# Patient Record
Sex: Female | Born: 1950
Health system: Southern US, Community
[De-identification: ages and names within clinical notes are randomized; demographics above are authoritative.]

## PROBLEM LIST (undated history)

## (undated) DIAGNOSIS — M1990 Unspecified osteoarthritis, unspecified site: Secondary | ICD-10-CM

## (undated) DIAGNOSIS — C801 Malignant (primary) neoplasm, unspecified: Secondary | ICD-10-CM

## (undated) DIAGNOSIS — Z8679 Personal history of other diseases of the circulatory system: Secondary | ICD-10-CM

## (undated) DIAGNOSIS — K644 Residual hemorrhoidal skin tags: Secondary | ICD-10-CM

## (undated) DIAGNOSIS — R112 Nausea with vomiting, unspecified: Secondary | ICD-10-CM

## (undated) DIAGNOSIS — T4145XA Adverse effect of unspecified anesthetic, initial encounter: Secondary | ICD-10-CM

## (undated) DIAGNOSIS — D649 Anemia, unspecified: Secondary | ICD-10-CM

## (undated) DIAGNOSIS — K921 Melena: Secondary | ICD-10-CM

## (undated) DIAGNOSIS — R221 Localized swelling, mass and lump, neck: Secondary | ICD-10-CM

## (undated) DIAGNOSIS — T8859XA Other complications of anesthesia, initial encounter: Secondary | ICD-10-CM

## (undated) DIAGNOSIS — Z9889 Other specified postprocedural states: Secondary | ICD-10-CM

## (undated) DIAGNOSIS — H9209 Otalgia, unspecified ear: Secondary | ICD-10-CM

## (undated) DIAGNOSIS — K648 Other hemorrhoids: Secondary | ICD-10-CM

## (undated) DIAGNOSIS — R220 Localized swelling, mass and lump, head: Secondary | ICD-10-CM

## (undated) DIAGNOSIS — N390 Urinary tract infection, site not specified: Secondary | ICD-10-CM

## (undated) DIAGNOSIS — I10 Essential (primary) hypertension: Secondary | ICD-10-CM

## (undated) HISTORY — PX: CYSTOSCOPY: SUR368

## (undated) HISTORY — DX: Otalgia, unspecified ear: H92.09

## (undated) HISTORY — PX: BREAST BIOPSY: SHX20

## (undated) HISTORY — DX: Localized swelling, mass and lump, head: R22.0

## (undated) HISTORY — PX: APPENDECTOMY: SHX54

## (undated) HISTORY — DX: Personal history of other diseases of the circulatory system: Z86.79

## (undated) HISTORY — DX: Melena: K92.1

## (undated) HISTORY — DX: Residual hemorrhoidal skin tags: K64.4

## (undated) HISTORY — DX: Essential (primary) hypertension: I10

## (undated) HISTORY — DX: Unspecified osteoarthritis, unspecified site: M19.90

## (undated) HISTORY — PX: COLONOSCOPY: SHX174

## (undated) HISTORY — PX: DIAGNOSTIC LAPAROSCOPY: SUR761

## (undated) HISTORY — PX: OTHER SURGICAL HISTORY: SHX169

## (undated) HISTORY — DX: Other hemorrhoids: K64.8

## (undated) HISTORY — DX: Anemia, unspecified: D64.9

## (undated) HISTORY — DX: Malignant (primary) neoplasm, unspecified: C80.1

## (undated) HISTORY — DX: Localized swelling, mass and lump, head: R22.1

---

## 1983-05-19 HISTORY — PX: OTHER SURGICAL HISTORY: SHX169

## 2000-10-20 ENCOUNTER — Encounter: Payer: Self-pay | Admitting: Family Medicine

## 2001-03-18 ENCOUNTER — Encounter: Payer: Self-pay | Admitting: Family Medicine

## 2001-03-18 LAB — CONVERTED CEMR LAB

## 2003-05-19 LAB — HM COLONOSCOPY

## 2005-06-18 LAB — CONVERTED CEMR LAB: Pap Smear: NORMAL

## 2005-07-31 ENCOUNTER — Ambulatory Visit: Payer: Self-pay | Admitting: Unknown Physician Specialty

## 2006-10-15 ENCOUNTER — Ambulatory Visit: Payer: Self-pay | Admitting: Family Medicine

## 2006-10-15 DIAGNOSIS — H9209 Otalgia, unspecified ear: Secondary | ICD-10-CM | POA: Insufficient documentation

## 2006-10-15 DIAGNOSIS — Z8679 Personal history of other diseases of the circulatory system: Secondary | ICD-10-CM | POA: Insufficient documentation

## 2006-10-15 DIAGNOSIS — K644 Residual hemorrhoidal skin tags: Secondary | ICD-10-CM | POA: Insufficient documentation

## 2006-10-15 DIAGNOSIS — K921 Melena: Secondary | ICD-10-CM | POA: Insufficient documentation

## 2006-10-15 DIAGNOSIS — R221 Localized swelling, mass and lump, neck: Secondary | ICD-10-CM

## 2006-10-15 DIAGNOSIS — K648 Other hemorrhoids: Secondary | ICD-10-CM | POA: Insufficient documentation

## 2006-10-15 DIAGNOSIS — R220 Localized swelling, mass and lump, head: Secondary | ICD-10-CM | POA: Insufficient documentation

## 2006-10-15 LAB — CONVERTED CEMR LAB: OCCULT 1: NEGATIVE

## 2006-10-18 DIAGNOSIS — D509 Iron deficiency anemia, unspecified: Secondary | ICD-10-CM | POA: Insufficient documentation

## 2006-10-18 LAB — CONVERTED CEMR LAB: Hemoglobin: 11.2 g/dL — ABNORMAL LOW (ref 12.0–15.0)

## 2006-11-05 ENCOUNTER — Encounter: Payer: Self-pay | Admitting: Family Medicine

## 2006-11-16 HISTORY — PX: HEMORRHOID SURGERY: SHX153

## 2006-12-10 ENCOUNTER — Ambulatory Visit (HOSPITAL_COMMUNITY): Admission: RE | Admit: 2006-12-10 | Discharge: 2006-12-10 | Payer: Self-pay | Admitting: *Deleted

## 2006-12-10 ENCOUNTER — Encounter (INDEPENDENT_AMBULATORY_CARE_PROVIDER_SITE_OTHER): Payer: Self-pay | Admitting: *Deleted

## 2006-12-24 ENCOUNTER — Encounter: Payer: Self-pay | Admitting: Family Medicine

## 2007-01-04 ENCOUNTER — Encounter: Payer: Self-pay | Admitting: Family Medicine

## 2007-01-07 ENCOUNTER — Encounter: Payer: Self-pay | Admitting: Family Medicine

## 2007-01-07 ENCOUNTER — Other Ambulatory Visit: Admission: RE | Admit: 2007-01-07 | Discharge: 2007-01-07 | Payer: Self-pay | Admitting: Family Medicine

## 2007-01-07 ENCOUNTER — Ambulatory Visit: Payer: Self-pay | Admitting: Family Medicine

## 2007-01-12 LAB — CONVERTED CEMR LAB: Pap Smear: NORMAL

## 2007-01-14 ENCOUNTER — Ambulatory Visit: Payer: Self-pay | Admitting: Family Medicine

## 2007-01-19 LAB — CONVERTED CEMR LAB
ALT: 13 units/L (ref 0–35)
AST: 15 units/L (ref 0–37)
Albumin: 3.4 g/dL — ABNORMAL LOW (ref 3.5–5.2)
Alkaline Phosphatase: 69 units/L (ref 39–117)
Bilirubin, Direct: 0.1 mg/dL (ref 0.0–0.3)
CO2: 32 meq/L (ref 19–32)
Chloride: 106 meq/L (ref 96–112)
Cholesterol: 173 mg/dL (ref 0–200)
HDL: 30.6 mg/dL — ABNORMAL LOW (ref >39.0)
Hemoglobin: 11.9 g/dL — ABNORMAL LOW (ref 12.0–15.0)
LDL Cholesterol: 125 mg/dL — ABNORMAL HIGH (ref 0–99)
Potassium: 4.3 meq/L (ref 3.5–5.1)
Sodium: 142 meq/L (ref 135–145)
Total Bilirubin: 0.5 mg/dL (ref 0.3–1.2)
Total CHOL/HDL Ratio: 5.7
Total Protein: 6.4 g/dL (ref 6.0–8.3)
Triglycerides: 86 mg/dL (ref 0–149)
VLDL: 17 mg/dL (ref 0–40)

## 2007-02-11 ENCOUNTER — Ambulatory Visit: Payer: Self-pay | Admitting: Family Medicine

## 2007-02-11 ENCOUNTER — Encounter: Payer: Self-pay | Admitting: Family Medicine

## 2008-07-02 ENCOUNTER — Ambulatory Visit: Payer: Self-pay | Admitting: Family Medicine

## 2008-07-12 ENCOUNTER — Encounter: Payer: Self-pay | Admitting: Family Medicine

## 2008-07-12 ENCOUNTER — Ambulatory Visit: Payer: Self-pay | Admitting: Family Medicine

## 2008-07-12 LAB — HM MAMMOGRAPHY: HM Mammogram: NORMAL

## 2008-07-16 ENCOUNTER — Encounter (INDEPENDENT_AMBULATORY_CARE_PROVIDER_SITE_OTHER): Payer: Self-pay | Admitting: *Deleted

## 2008-08-03 ENCOUNTER — Ambulatory Visit: Payer: Self-pay | Admitting: Family Medicine

## 2008-08-06 LAB — CONVERTED CEMR LAB
ALT: 18 units/L (ref 0–35)
AST: 17 units/L (ref 0–37)
Albumin: 3.8 g/dL (ref 3.5–5.2)
Alkaline Phosphatase: 66 units/L (ref 39–117)
BUN: 14 mg/dL (ref 6–23)
Bilirubin, Direct: 0 mg/dL (ref 0.0–0.3)
CO2: 30 meq/L (ref 19–32)
Calcium: 9 mg/dL (ref 8.4–10.5)
Chloride: 112 meq/L (ref 96–112)
Cholesterol: 199 mg/dL (ref 0–200)
Creatinine, Ser: 0.8 mg/dL (ref 0.4–1.2)
GFR calc non Af Amer: 78.58 mL/min (ref >60)
Glucose, Bld: 98 mg/dL (ref 70–99)
HDL: 42.7 mg/dL (ref >39.00)
LDL Cholesterol: 140 mg/dL — ABNORMAL HIGH (ref 0–99)
Potassium: 4.3 meq/L (ref 3.5–5.1)
Sodium: 146 meq/L — ABNORMAL HIGH (ref 135–145)
Total Bilirubin: 0.5 mg/dL (ref 0.3–1.2)
Total CHOL/HDL Ratio: 5
Total Protein: 6.6 g/dL (ref 6.0–8.3)
Triglycerides: 80 mg/dL (ref 0.0–149.0)
VLDL: 16 mg/dL (ref 0.0–40.0)

## 2008-11-06 ENCOUNTER — Encounter (INDEPENDENT_AMBULATORY_CARE_PROVIDER_SITE_OTHER): Payer: Self-pay | Admitting: *Deleted

## 2010-06-16 ENCOUNTER — Ambulatory Visit
Admission: RE | Admit: 2010-06-16 | Discharge: 2010-06-16 | Payer: Self-pay | Source: Home / Self Care | Attending: Family Medicine | Admitting: Family Medicine

## 2010-06-16 ENCOUNTER — Encounter: Payer: Self-pay | Admitting: Family Medicine

## 2010-06-16 DIAGNOSIS — N390 Urinary tract infection, site not specified: Secondary | ICD-10-CM | POA: Insufficient documentation

## 2010-06-16 LAB — CONVERTED CEMR LAB
Bilirubin Urine: NEGATIVE
Glucose, Urine, Semiquant: NEGATIVE
Ketones, urine, test strip: NEGATIVE
Nitrite: NEGATIVE
Protein, U semiquant: NEGATIVE
Specific Gravity, Urine: 1.015
Urobilinogen, UA: 0.2
WBC Urine, dipstick: NEGATIVE
pH: 5

## 2010-06-17 ENCOUNTER — Telehealth: Payer: Self-pay | Admitting: Family Medicine

## 2010-06-17 NOTE — Consult Note (Signed)
Summary: Consultation Report  Consultation Report   Imported By: Mickle Asper 12/30/2006 13:22:49  _____________________________________________________________________  External Attachment:    Type:   Image     Comment:   External Document

## 2010-06-17 NOTE — Letter (Signed)
Summary: Results Follow up Letter  [place] [facility]  [address] [place], [place] [address]   Phone: [phone]  Fax: [phone]    07/16/2008 MRN: [id number]    Steve Ilg [address] [place], [place]  [address]    Dear Ms. DESAUTEL,  The following are the results of your recent test(s):  Test         Result    Pap Smear:        Normal _____  Not Normal _____ Comments: ______________________________________________________ Cholesterol: LDL(Bad cholesterol):         Your goal is less than:         HDL (Good cholesterol):       Your goal is more than: Comments:  ______________________________________________________ Mammogram:        Normal ___X__  Not Normal _____ Comments:  Yearly follow up is recommended.   ___________________________________________________________________ Hemoccult:        Normal _____  Not normal _______ Comments:    _____________________________________________________________________ Other Tests:    We routinely do not discuss normal results over the telephone.  If you desire a copy of the results, or you have any questions about this information we can discuss them at your next office visit.   Sincerely,     Excell Seltzer, M.D.  AEB:lsf

## 2010-06-17 NOTE — Assessment & Plan Note (Signed)
Summary: PAP SMEAR AND CPX/CLE   Vital Signs:  Patient Profile:   60 Years Old Female Height:     67.5 inches (171.45 cm) Weight:      188.38 pounds Temp:     98.6 degrees F oral Pulse rate:   76 / minute Pulse rhythm:   regular BP sitting:   120 / 80  (left arm) Cuff size:   regular  Vitals Entered By: Delilah Shan (July 02, 2008 2:23 PM)                 Chief Complaint:  CPX  - ? Pap.  History of Present Illness: Doing well no acute complaints.  Due for mammogram, sister diagnosed with breast cancer in last few months.     Current Allergies (reviewed today): No known allergies   Past Medical History:    Reviewed history and no changes required:              ANEMIA, IRON DEFICIENCY NOS (ICD-280.9)       HEMORRHOIDS, INTERNAL, WITH BLEEDING (ICD-455.2)       EAR PAIN, RIGHT (ICD-388.70)       SYMPTOM, SWELLING IN HEAD/NECK (ICD-784.2)       HEMATOCHEZIA (ICD-578.1)       EXTERNAL HEMORRHOIDS (ICD-455.3)       HEART MURMUR, HX OF (ICD-V12.50)           Family History:    father died age 81 Bright's disease, heart disease, MI    mother age 109 HTN, bradycardia,  no MI    PGM: DM    MGM: CVA    sister: DM, high cholesterol, breast cancer    brother; HTN, meningioma on brain stem    sister HTN, high cholesterol  Social History:    Reviewed history from 01/04/2007 and no changes required:       Occupation: Stage manager       Married       2 kids, healthy, one is adopted, age 4, (32 week old grandchild, hasn't seen)       Former smoker:  1-1/2 years as teen       Alcohol use-yes, 1 drink per week       Drug use-no       Regular exercise-no, some walking       Diet; some fruit and veggies, avoids fast food           Review of Systems       no breast lesions  General      Denies fatigue.  CV      Denies chest pain or discomfort, fatigue, and swelling of feet.  Resp      Denies shortness of breath.  GI      Denies abdominal pain,  bloody stools, change in bowel habits, constipation, and diarrhea.  GU      Denies abnormal vaginal bleeding and dysuria.  Derm      Denies lesion(s).  Psych      Denies anxiety and depression.   Physical Exam  General:     overweight apearing female in NAD Eyes:     No corneal or conjunctival inflammation noted. EOMI. Perrla. Funduscopic exam benign, without hemorrhages, exudates or papilledema. Vision grossly normal. Ears:     External ear exam shows no significant lesions or deformities.  Otoscopic examination reveals clear canals, tympanic membranes are intact bilaterally without bulging, retraction, inflammation or discharge. Hearing is grossly normal bilaterally. Nose:  External nasal examination shows no deformity or inflammation. Nasal mucosa are pink and moist without lesions or exudates. Mouth:     Oral mucosa and oropharynx without lesions or exudates.  Teeth in good repair. Neck:     2cm soft mobile mass posterior neck... feels more like lipoma than lymphnode Breasts:     No mass, nodules, thickening, tenderness, bulging, retraction, inflamation, nipple discharge or skin changes noted.   Lungs:     Normal respiratory effort, chest expands symmetrically. Lungs are clear to auscultation, no crackles or wheezes. Heart:     Normal rate and regular rhythm. S1 and S2 normal without gallop, murmur, click, rub or other extra sounds. Abdomen:     Bowel sounds positive,abdomen soft and non-tender without masses, organomegaly or hernias noted. Pulses:     R and L posterior tibial pulses are full and equal bilaterally  Extremities:     no edema Skin:     multile moles... has upcoming skin check with dermaltologist    Impression & Recommendations:  Problem # 1:  EXAMINATION, ROUTINE MEDICAL (ICD-V70.0) Reviewed preventive care protocols, scheduled due services, and updated immunizations. Encouraged exercise, weight loss, healthy eating habits.  Due for reeval of    Problem # 2:  SYMPTOM, SWELLING IN HEAD/NECK (ICD-784.2) More consistent with lipoma. Get second opinion at upcoming derm appt.   Complete Medication List: 1)  Calcium 600/vitamin D 600-400 Mg-unit Tabs (Calcium carbonate-vitamin d) .... Take 1 tablet by mouth every 12 hours  Other Orders: Radiology Referral (Radiology)   Patient Instructions: 1)  Fasting lipid, CMET Dx v77.91 2)  Referral Appointment Information 3)  Day/Date: 4)  Time: 5)  Place/MD: 6)  Address: 7)  Phone/Fax: 8)  Patient given appointment information. Information/Orders faxed/mailed.     Prior Medications (reviewed today): None Current Allergies (reviewed today): No known allergies  Current Medications (including changes made in today's visit):  CALCIUM 600/VITAMIN D 600-400 MG-UNIT TABS (CALCIUM CARBONATE-VITAMIN D) Take 1 tablet by mouth every 12 hours   Flu Vaccine Result Date:  01/17/2008 Flu Vaccine Result:  given Flu Vaccine Next Due:  1 yr Last PAP:  normal (01/12/2007 8:11:49 AM) PAP Next Due:  3 yr           Tetanus/Td Vaccine (to be given today)   Appended Document: PAP SMEAR AND CPX/CLE   Tetanus/Td Vaccine    Vaccine Type: Tdap    Site: left deltoid    Mfr: Sanofi Pasteur    Dose: 0.5 ml    Route: IM    Given by: Delilah Shan    Exp. Date: 07/13/2010    Lot #: [id number]    VIS given: 04/05/07 version given July 02, 2008.

## 2010-06-17 NOTE — Assessment & Plan Note (Signed)
Summary: PT TO RE-ESTABLISH/CLE   Vital Signs:  Patient Profile:   60 Years Old Female Weight:      182 pounds Temp:     97.8 degrees F oral Pulse rate:   63 / minute BP sitting:   116 / 84  (left arm) Cuff size:   regular  Vitals Entered By: Cooper Render (Oct 15, 2006 3:45 PM)               Chief Complaint:  to re est.  History of Present Illness: Here  to reestablish, lastseen Dr. Milinda Antis in 2003  Due for mamm, pap, ? colon,  had DEXa in 2007  Bleeding hemmorhoids daily brbpr ,,occ feels protruding hemorrhoids, tender to palp some clots in toilet daily. No pain, itching mild discomfort after BM. Fatigue, tires easily x 1-2 months OCc intermittant dizzyness No SOB, chest pain  lump behind neck, no change in size x 1-2 months, no tender, no erthyma  R ear pain, was tender touch x 1 weeks, no cold symptoms, better today, no fever chills, no drainage, no hearing change     Current Allergies: No known allergies   Past Surgical History:    1980 fluid filled cyst on R fallopian tube, surgical removal of R tube    1980 appendectomy    In vitor fertilization 1985, 198    1999 breast biopsy, R   Family History:    father died age 45 Bright's disease, heart disease, MI    mother age 19 HTN, bradycardia,  no MI    PGM: DM    MGM: CVA    sister: DM, high cholesterol    brother; HTN, meningioma on brain stem    sister HTN, high cholesterol  Social History:    Occupation: Stage manager    Married    2 kids, healthy, one is adopted    Never Smoked    Alcohol use-yes, 1 drink per week    Drug use-no    Regular exercise-no    Diet; some fruit and veggies, avoids fast food       Risk Factors:  Tobacco use:  never Drug use:  no Alcohol use:  yes Exercise:  no  Mammogram History:     Date of Last Mammogram:  06/18/2006    Results:  Normal Bilateral   PAP Smear History:     Date of Last PAP Smear:  06/18/2005    Results:  Normal   Colonoscopy  History:     Date of Last Colonoscopy:  05/19/2003    Results:  Adenomatous Polyp    Review of Systems      See HPI   Physical Exam  General:     Well-developed,well-nourished,in no acute distress; alert,appropriate and cooperative throughout examination Head:     Normocephalic and atraumatic without obvious abnormalities. No apparent alopecia or balding. Eyes:     No corneal or conjunctival inflammation noted. EOMI. Perrla. Vision grossly normal. Ears:     External ear exam shows no significant lesions or deformities.  Otoscopic examination reveals clear canals, tympanic membranes are intact bilaterally without bulging, retraction, inflammation or discharge. Hearing is grossly normal bilaterally. Nose:     External nasal examination shows no deformity or inflammation. Nasal mucosa are pink and moist without lesions or exudates. Mouth:     Oral mucosa and oropharynx without lesions or exudates.  Teeth in good repair. Neck:     lima bean size mass on posterior R neck, non tender, no  erythema Lungs:     Normal respiratory effort, chest expands symmetrically. Lungs are clear to auscultation, no crackles or wheezes. Heart:     Normal rate and regular rhythm. S1 and S2 normal without gallop, murmur, click, rub or other extra sounds. Abdomen:     Bowel sounds positive,abdomen soft and non-tender without masses, organomegaly or hernias noted. Rectal:     internal hemorrhoid(s), no bleeding.   Pulses:     R and L carotid,radial,femoral,dorsalis pedis and posterior tibial pulses are full and equal bilaterally Extremities:     No clubbing, cyanosis, edema, or deformity noted with normal full range of motion of all joints.   Neurologic:     No cranial nerve deficits noted. Station and gait are Plantar reflexes are down-going bilaterally. DTRs are symmetrical throughout. Sensory, motor and coordinative functions appear intact. Cervical Nodes:     No lymphadenopathy noted No posterior  occipital, or supraclavicaular nodes. Axillary Nodes:     No palpable lymphadenopathy Psych:     Cognition and judgment appear intact. Alert and cooperative with normal attention span and concentration. No apparent delusions, illusions, hallucinations    Impression & Recommendations:  Problem # 1:  HEMATOCHEZIA (ICD-578.1) Non tender brbrp in setting on grade 2 internal hemmorhoids. She is having significant chronic blood loss and wold likely benefiit from hemmorhoid trx such as sclerotherapy or banding.  Will refer her to general surgeon for consult.  Also will get records of old colonoscopy, may be due for repeat eval by GI with colonoscopy.  Will schedule if needed. Check Hg to eval for anemia. Orders: Anoscopy ([id number])   Problem # 2:  SYMPTOM, SWELLING IN HEAD/NECK (ICD-784.2) likely cyst or lymph node, ? reactive from recent ear issue/inf?  Follow.  Problem # 3:  EAR PAIN, RIGHT (ICD-388.70) Assessment: Improved Ears clear B. If pain returns f/u.  Other Orders: T- * Misc. Laboratory test [phone]) Surgical Referral (Surgery)   Patient Instructions: 1)  Return for pap/breast exam. 2)  Referral Appointment Information  General surgery for consult on bleeding internal hemmorhoids and possible sclerotherapy/banding etc. 3)  Day/Date: 4)  Time: 5)  Place/MD: 6)  Address: 7)  Phone/Fax: 8)  Patient given appointment information. Information/Orders faxed/mailed.   Laboratory Results    Stool - Occult Blood Hemmoccult #1: negative Date: 10/15/2006

## 2010-06-17 NOTE — Letter (Signed)
Summary: [place] No Show Letter  [place] at [facility]  [address] [place], [place] [address]   Phone: [phone]  Fax: [phone]    11/06/2008 MRN: [id number]  [facility] [address] [place], [place]  [address]   Dear Jo White,   Our records indicate that you missed your scheduled appointment with _lab___________________ on __6/22/10__________.  Please contact this office to reschedule your appointment as soon as possible.  It is important that you keep your scheduled appointments with your physician, so we can provide you the best care possible.  Please be advised that there may be a charge for "no show" appointments.    Sincerely,    at [facility]

## 2010-06-17 NOTE — Consult Note (Signed)
Summary: Consultation Report  Consultation Report   Imported By: Beau Fanny 11/15/2006 15:44:30  _____________________________________________________________________  External Attachment:    Type:   Image     Comment:   External Document

## 2010-06-25 NOTE — Assessment & Plan Note (Signed)
Summary: UTI   Vital Signs:  Patient profile:   60 year old female Height:      67.5 inches Weight:      205.75 pounds BMI:     31.86 Temp:     97.9 degrees F oral Pulse rate:   88 / minute Pulse rhythm:   regular BP sitting:   154 / 88  (left arm) Cuff size:   large  Vitals Entered By: Delilah Shan CMA Danese Dorsainvil Dull) (June 16, 2010 2:01 PM) CC: ? UTI   History of Present Illness: Mother is in hospital and very ill.  Saturday night noted red urine.  No pain at that point, no urgency.  Some urgency and red urine on "Sunday.  Went to UC in [place] yesterday, she was told that urine had bacteria and white cells.  Started on macrobid and pyridium.  More frequency in the last 24 hours.  More pain with urination.  Has passed clots.  This AM the pain and urgency started back.  No fevers.  No vomiting.  Lower back has been mildly sore today, intermittently.    Allergies: No Known Drug Allergies  Review of Systems       See HPI.  Otherwise negative.    Physical Exam  General:  no apparent distress, but tearful discussing her mother's condition, regains composure mucous membranes moist regular rate and rhythm clear to auscultation bilaterally abdomen soft, not tender to palpation, no suprapubic pain ext w/o edema no CVA pain   Impression & Recommendations:  Problem # 1:  UTI (ICD-599.0) Likely a hemorrhagic cystitis, likely infectious.  Will change to septra and continue the pyridium for now.  Push fluids and follow up as needed.  No fever, nontoxic, and no indication of pyelo.  She'll let us know if symptoms continue, worsen, or if she gets a fever.  she understands.   Her updated medication list for this problem includes:    Septra Ds 800-160 Mg Tabs (Sulfamethoxazole-trimethoprim) ..... 1 by mouth two times a day  Orders: Specimen Handling (99000) T-Culture, Urine ([id number]) Prescription Created Electronically (G8553) UA Dipstick w/o Micro (manual) (81002)  Complete  Medication List: 1)  Septra Ds 800-160 Mg Tabs (Sulfamethoxazole-trimethoprim) .... 1 by mouth two times a day  Patient Instructions: 1)  Stop the macrobid, start the septra and drink plenty of fluids.  Let us know if you have a fever.  We'll contact you with your lab report.  Prescriptions: SEPTRA DS 800-160 MG TABS (SULFAMETHOXAZOLE-TRIMETHOPRIM) 1 by mouth two times a day  #6 x 0   Entered and Authorized by:   Shankar Silber MD   Signed by:   Jalissa Heinzelman MD on 06/16/2010   Method used:   Faxed to ...       [facility] [address] (retail)       [address]       [place], [place]    [place]       Ph: [phone]       Fax: [phone]   RxID:   [id number]    Orders Added: 1)  Specimen Handling [99000] 2)  T-Culture, Urine [[id number]] 3)  Prescription Created Electronically [G8553] 4)  Est. Patient Level III [36644] 5)  UA Dipstick w/o Micro (manual) [81002]    Current Allergies (reviewed today): No known allergies   Laboratory Results   Urine Tests  Date/Time Received: June 16, 2010 2:37 PM   Routine Urinalysis   Color: brown Appearance: Cloudy Glucose: negative   (Normal  Range: Negative) Bilirubin: negative   (Normal Range: Negative) Ketone: negative   (Normal Range: Negative) Spec. Gravity: 1.015   (Normal Range: 1.003-1.035) Blood: large   (Normal Range: Negative) pH: 5.0   (Normal Range: 5.0-8.0) Protein: negative   (Normal Range: Negative) Urobilinogen: 0.2   (Normal Range: 0-1) Nitrite: negative   (Normal Range: Negative) Leukocyte Esterace: negative   (Normal Range: Negative)    Comments: Severely obscured by Pyridium.

## 2010-07-03 NOTE — Procedures (Signed)
Summary: colonoscopy/endoscopy  colonoscopy/endoscopy   Imported By: Kassie Mends 06/25/2010 11:24:52  _____________________________________________________________________  External Attachment:    Type:   Image     Comment:   External Document

## 2010-07-09 NOTE — Progress Notes (Signed)
Summary: ? Colonoscopy  Phone Note Call from Patient   Details for Reason: [facility]ain Summary of Call: Notify pt that records show last colonoscopy at [facility] was 2002... hyperplastic polyps seen... they reocmmended her having it repeated in 5 years.Marland Kitchen ie 2007... did she ever have the repeat done? Initial call taken by: Kerby Nora MD,  June 17, 2010 2:08 PM  Follow-up for Phone Call        Spoke to patient and was informed that she did not have the repeat colonoscopy that was recommended. Patient said that she has an appointment scheduled with you in April and will talk with you about setting it up at that time. Patient stated that she has too much going on now to schedule it at this time. Sydell Axon LPN  June 30, 2010 4:49 PM

## 2010-07-14 ENCOUNTER — Encounter: Payer: Self-pay | Admitting: Family Medicine

## 2010-07-19 ENCOUNTER — Encounter: Payer: Self-pay | Admitting: Family Medicine

## 2010-08-05 ENCOUNTER — Other Ambulatory Visit: Payer: Self-pay

## 2010-08-11 ENCOUNTER — Encounter: Payer: Self-pay | Admitting: Family Medicine

## 2010-08-27 ENCOUNTER — Telehealth: Payer: Self-pay | Admitting: Family Medicine

## 2010-08-27 DIAGNOSIS — D509 Iron deficiency anemia, unspecified: Secondary | ICD-10-CM

## 2010-08-27 DIAGNOSIS — Z13220 Encounter for screening for lipoid disorders: Secondary | ICD-10-CM

## 2010-08-27 NOTE — Telephone Encounter (Signed)
Message copied by Kerby Nora on Wed Aug 27, 2010  6:44 PM ------      Message from: Margarite Gouge, NATASHA      Created: Wed Aug 27, 2010  7:24 AM      Regarding: Cpx alabs thurs       Please order  future cpx labs for pt's upcomming lab appt.      Thanks      Rodney Booze

## 2010-08-28 ENCOUNTER — Other Ambulatory Visit (INDEPENDENT_AMBULATORY_CARE_PROVIDER_SITE_OTHER): Payer: BLUE CROSS/BLUE SHIELD | Admitting: Family Medicine

## 2010-08-28 DIAGNOSIS — Z13220 Encounter for screening for lipoid disorders: Secondary | ICD-10-CM

## 2010-08-28 DIAGNOSIS — E785 Hyperlipidemia, unspecified: Secondary | ICD-10-CM

## 2010-08-28 DIAGNOSIS — D509 Iron deficiency anemia, unspecified: Secondary | ICD-10-CM

## 2010-08-28 LAB — CBC WITH DIFFERENTIAL/PLATELET
Basophils Absolute: 0 10*3/uL (ref 0.0–0.1)
Basophils Relative: 0.4 % (ref 0.0–3.0)
Eosinophils Absolute: 0.1 10*3/uL (ref 0.0–0.7)
Eosinophils Relative: 3.2 % (ref 0.0–5.0)
HCT: 37.6 % (ref 36.0–46.0)
Hemoglobin: 13.1 g/dL (ref 12.0–15.0)
Lymphocytes Relative: 34.7 % (ref 12.0–46.0)
Lymphs Abs: 1.5 10*3/uL (ref 0.7–4.0)
MCHC: 34.8 g/dL (ref 30.0–36.0)
MCV: 86.6 fl (ref 78.0–100.0)
Monocytes Absolute: 0.4 10*3/uL (ref 0.1–1.0)
Monocytes Relative: 9.7 % (ref 3.0–12.0)
Neutro Abs: 2.2 10*3/uL (ref 1.4–7.7)
Neutrophils Relative %: 52 % (ref 43.0–77.0)
Platelets: 185 10*3/uL (ref 150.0–400.0)
RBC: 4.34 Mil/uL (ref 3.87–5.11)
RDW: 14.2 % (ref 11.5–14.6)
WBC: 4.3 10*3/uL — ABNORMAL LOW (ref 4.5–10.5)

## 2010-08-28 LAB — COMPREHENSIVE METABOLIC PANEL
ALT: 17 U/L (ref 0–35)
AST: 17 U/L (ref 0–37)
Albumin: 3.9 g/dL (ref 3.5–5.2)
Alkaline Phosphatase: 66 U/L (ref 39–117)
BUN: 13 mg/dL (ref 6–23)
CO2: 28 mEq/L (ref 19–32)
Calcium: 8.9 mg/dL (ref 8.4–10.5)
Chloride: 106 mEq/L (ref 96–112)
Creatinine, Ser: 0.8 mg/dL (ref 0.4–1.2)
GFR: 74.77 mL/min (ref >60.00)
Glucose, Bld: 101 mg/dL — ABNORMAL HIGH (ref 70–99)
Potassium: 4.6 mEq/L (ref 3.5–5.1)
Sodium: 140 mEq/L (ref 135–145)
Total Bilirubin: 0.7 mg/dL (ref 0.3–1.2)
Total Protein: 6.7 g/dL (ref 6.0–8.3)

## 2010-08-28 LAB — LIPID PANEL
Cholesterol: 225 mg/dL — ABNORMAL HIGH (ref 0–200)
HDL: 42.5 mg/dL (ref >39.00)
Total CHOL/HDL Ratio: 5
Triglycerides: 182 mg/dL — ABNORMAL HIGH (ref 0.0–149.0)
VLDL: 36.4 mg/dL (ref 0.0–40.0)

## 2010-08-28 LAB — LDL CHOLESTEROL, DIRECT: Direct LDL: 150.1 mg/dL

## 2010-08-29 ENCOUNTER — Encounter: Payer: Self-pay | Admitting: Family Medicine

## 2010-09-02 ENCOUNTER — Other Ambulatory Visit (HOSPITAL_COMMUNITY)
Admission: RE | Admit: 2010-09-02 | Discharge: 2010-09-02 | Disposition: A | Discharge status: Home / Self Care | Payer: BLUE CROSS/BLUE SHIELD | Source: Ambulatory Visit | Attending: Family Medicine | Admitting: Family Medicine

## 2010-09-02 ENCOUNTER — Ambulatory Visit (INDEPENDENT_AMBULATORY_CARE_PROVIDER_SITE_OTHER): Payer: BLUE CROSS/BLUE SHIELD | Admitting: Family Medicine

## 2010-09-02 ENCOUNTER — Encounter: Payer: Self-pay | Admitting: Family Medicine

## 2010-09-02 VITALS — BP 120/80 | HR 69 | Temp 98.4°F | Ht 67.5 in | Wt 202.1 lb

## 2010-09-02 DIAGNOSIS — M899 Disorder of bone, unspecified: Secondary | ICD-10-CM

## 2010-09-02 DIAGNOSIS — Z1231 Encounter for screening mammogram for malignant neoplasm of breast: Secondary | ICD-10-CM

## 2010-09-02 DIAGNOSIS — E7800 Pure hypercholesterolemia, unspecified: Secondary | ICD-10-CM

## 2010-09-02 DIAGNOSIS — Z01419 Encounter for gynecological examination (general) (routine) without abnormal findings: Secondary | ICD-10-CM | POA: Insufficient documentation

## 2010-09-02 DIAGNOSIS — Z1211 Encounter for screening for malignant neoplasm of colon: Secondary | ICD-10-CM

## 2010-09-02 DIAGNOSIS — M8580 Other specified disorders of bone density and structure, unspecified site: Secondary | ICD-10-CM

## 2010-09-02 DIAGNOSIS — Z0000 Encounter for general adult medical examination without abnormal findings: Secondary | ICD-10-CM

## 2010-09-02 NOTE — Progress Notes (Signed)
Subjective:    Patient ID: Jo White, female    DOB: January 15, 1951, 60 y.o.   MRN: [id number]  HPI  The patient is here for annual wellness exam and preventative care.    Has gained about 30 lbs since 2010 when she was seen last. Minimal exercsie... Working more hours. Trying to eat healthy foods in last month.  Post menopausal for years.     Review of Systems  Constitutional: Negative for fever, fatigue and unexpected weight change.  HENT: Negative for ear pain, congestion, sore throat, sneezing, trouble swallowing and sinus pressure.   Eyes: Negative for pain and itching.  Respiratory: Negative for cough, shortness of breath and wheezing.   Cardiovascular: Negative for chest pain, palpitations and leg swelling.  Gastrointestinal: Negative for nausea, abdominal pain, diarrhea, constipation and blood in stool.  Genitourinary: Negative for dysuria, hematuria, vaginal discharge, difficulty urinating and menstrual problem.  Skin: Negative for rash.  Neurological: Negative for syncope, weakness, light-headedness, numbness and headaches.  Psychiatric/Behavioral: Negative for confusion and dysphoric mood. The patient is not nervous/anxious.        Objective:   Physical Exam  Constitutional: Vital signs are normal. She appears well-developed and well-nourished. She is cooperative.  Non-toxic appearance. She does not appear ill. No distress.  HENT:  Head: Normocephalic.  Right Ear: Hearing, tympanic membrane, external ear and ear canal normal.  Left Ear: Hearing, tympanic membrane, external ear and ear canal normal.  Nose: Nose normal.  Eyes: Conjunctivae, EOM and lids are normal. Pupils are equal, round, and reactive to light. No foreign bodies found.  Neck: Trachea normal and normal range of motion. Neck supple. Carotid bruit is not present. No mass and no thyromegaly present.  Cardiovascular: Normal rate, regular rhythm, S1 normal, S2 normal, normal heart sounds and intact distal  pulses.  Exam reveals no gallop.   No murmur heard. Pulmonary/Chest: Effort normal and breath sounds normal. No respiratory distress. She has no wheezes. She has no rhonchi. She has no rales.  Abdominal: Soft. Normal appearance and bowel sounds are normal. She exhibits no distension, no fluid wave, no abdominal bruit and no mass. There is no hepatosplenomegaly. There is no tenderness. There is no rebound, no guarding and no CVA tenderness. No hernia.  Genitourinary: Vagina normal and uterus normal. No breast swelling, tenderness, discharge or bleeding. Pelvic exam was performed with patient prone. There is no rash, tenderness or lesion on the right labia. There is no rash, tenderness or lesion on the left labia. Uterus is not enlarged and not tender. Cervix exhibits no motion tenderness, no discharge and no friability. Right adnexum displays no mass, no tenderness and no fullness. Left adnexum displays no mass, no tenderness and no fullness.  Lymphadenopathy:    She has no cervical adenopathy.    She has no axillary adenopathy.  Neurological: She is alert. She has normal strength. No cranial nerve deficit or sensory deficit.  Skin: Skin is warm, dry and intact. No rash noted.  Psychiatric: Her speech is normal and behavior is normal. Judgment normal. Her mood appears not anxious. Cognition and memory are normal. She does not exhibit a depressed mood.          Assessment & Plan:  Complete Physical Exam The patient's preventative maintenance and recommended screening tests for an annual wellness exam were reviewed in full today. Brought up to date unless services declined.  Counselled on the importance of diet, exercise, and its role in overall health and mortality. The patient's  FH and SH was reviewed, including their home life, tobacco status, and drug and alcohol status.

## 2010-09-02 NOTE — Patient Instructions (Addendum)
Fish oil.. Omega three fatty acids... 2000 mg divided daily. Work on low fat , low cholesterol diet. Decrease carbohydrates in diet. Exercise 3-5 times a week. Stop at front desk to schedule bone density, GI referral and mammogram.. They may need to call you to schedule.

## 2010-09-04 DIAGNOSIS — E785 Hyperlipidemia, unspecified: Secondary | ICD-10-CM | POA: Insufficient documentation

## 2010-09-04 DIAGNOSIS — M8580 Other specified disorders of bone density and structure, unspecified site: Secondary | ICD-10-CM | POA: Insufficient documentation

## 2010-09-04 NOTE — Assessment & Plan Note (Signed)
Discussed new elevation of LDL and triglycerides along with weight gain. Encouraged exercise, weight loss, healthy eating habits.  Info given on low chol diet.  Recheck in 3 months.

## 2010-09-11 LAB — HM PAP SMEAR: HM Pap smear: NORMAL

## 2010-09-24 ENCOUNTER — Ambulatory Visit: Payer: Self-pay | Admitting: Family Medicine

## 2010-09-24 LAB — HM DEXA SCAN

## 2010-09-30 NOTE — Op Note (Signed)
NAME:  Jo White, Jo White                  ACCOUNT NO.:  [id number]   MEDICAL RECORD NO.:  [id number]          PATIENT TYPE:  AMB   LOCATION:  DAY                          FACILITY:  [facility]   PHYSICIAN:  Alfonse Ras, MD   DATE OF BIRTH:  03-03-1951   DATE OF PROCEDURE:  12/10/2006  DATE OF DISCHARGE:                               OPERATIVE REPORT   PREOPERATIVE DIAGNOSIS:  Grade II internal hemorrhoids.   POSTOPERATIVE DIAGNOSIS:  Grade II internal hemorrhoids.   PROCEDURE:  Procedure for prolapsing hemorrhoids, stapled rectopexy.   SURGEON:  Alfonse Ras, MD.   ANESTHESIA:  General   DESCRIPTION OF PROCEDURE:  The patient was taken to the operating room,  placed in supine position.  After adequate general anesthesia was  induced using endotracheal tube, the patient was placed in the prone  jack-knife position.  Perianal and rectal prep were undertaken.  The  hemorrhoidal bundles were injected with 0.5 Marcaine and internal and  external sphincter muscles were injected with 0.5 Marcaine.  Rectal  dilatation was accomplished.  A 2-0 Prolene pursestring suture was  placed in the mucosa and submucosa approximately 5 cm proximal to the  dentate line.  A PPH stapler was then inserted and pursestring suture  was tied down around it.  The stapler was closed.  The vagina was  checked.  There was no evidence of action with the vagina.  It was held  in place after being closed for approximately 45 seconds.  It was fired  and removed.  There was about a 2 cm segment of hemorrhoidal tissue  which was removed and sent for pathologic evaluation.  Staple line was  inspected.  There was one small bleeding area which was oversewn with 3-  0 chromic suture and inspected it a second time after about 5 minutes.  There was no further bleeding.  Gelfoam packing was placed.  Sterile  dressing was applied.  The patient tolerated the procedure well and went  to PACU in good condition.      Alfonse Ras, MD  Electronically Signed     KRE/MEDQ  D:  12/10/2006  T:  12/10/2006  Job:  [id number]

## 2010-10-08 ENCOUNTER — Ambulatory Visit: Payer: Self-pay | Admitting: Family Medicine

## 2010-10-17 ENCOUNTER — Encounter: Payer: Self-pay | Admitting: Family Medicine

## 2010-10-21 ENCOUNTER — Encounter: Payer: Self-pay | Admitting: Internal Medicine

## 2010-10-23 ENCOUNTER — Ambulatory Visit (AMBULATORY_SURGERY_CENTER): Payer: BC Managed Care – PPO | Admitting: *Deleted

## 2010-10-23 VITALS — Ht 68.0 in | Wt 201.0 lb

## 2010-10-23 DIAGNOSIS — Z860100 Personal history of colon polyps, unspecified: Secondary | ICD-10-CM

## 2010-10-23 MED ORDER — PEG-KCL-NACL-NASULF-NA ASC-C 100 G PO SOLR: ORAL | Status: DC

## 2010-11-05 ENCOUNTER — Encounter: Payer: Self-pay | Admitting: Family Medicine

## 2010-11-05 ENCOUNTER — Encounter: Payer: Self-pay | Admitting: *Deleted

## 2010-11-07 ENCOUNTER — Ambulatory Visit (AMBULATORY_SURGERY_CENTER): Payer: BC Managed Care – PPO | Admitting: Gastroenterology

## 2010-11-07 ENCOUNTER — Other Ambulatory Visit: Payer: BLUE CROSS/BLUE SHIELD | Admitting: Gastroenterology

## 2010-11-07 ENCOUNTER — Encounter: Payer: Self-pay | Admitting: Gastroenterology

## 2010-11-07 VITALS — BP 136/66 | HR 68 | Resp 18 | Ht 68.0 in | Wt 200.0 lb

## 2010-11-07 DIAGNOSIS — K635 Polyp of colon: Secondary | ICD-10-CM

## 2010-11-07 DIAGNOSIS — Z1211 Encounter for screening for malignant neoplasm of colon: Secondary | ICD-10-CM

## 2010-11-07 DIAGNOSIS — D126 Benign neoplasm of colon, unspecified: Secondary | ICD-10-CM

## 2010-11-07 DIAGNOSIS — Z86010 Personal history of colonic polyps: Secondary | ICD-10-CM

## 2010-11-07 MED ORDER — SODIUM CHLORIDE 0.9 % IV SOLN: 500.0000 mL | INTRAVENOUS | Status: DC

## 2010-11-07 NOTE — Patient Instructions (Signed)
Discharge instructions given with verbal understanding. Handout on polyps given. Resume previous medications. 

## 2010-11-10 ENCOUNTER — Telehealth: Payer: Self-pay

## 2010-11-10 NOTE — Telephone Encounter (Signed)
No id on answering machine  

## 2010-11-26 ENCOUNTER — Other Ambulatory Visit: Payer: BLUE CROSS/BLUE SHIELD

## 2011-01-11 LAB — HM COLONOSCOPY

## 2011-03-02 LAB — DIFFERENTIAL
Basophils Absolute: 0
Basophils Relative: 0
Eosinophils Absolute: 0.1
Eosinophils Relative: 3
Lymphocytes Relative: 27
Lymphs Abs: 0.9
Monocytes Absolute: 0.4
Monocytes Relative: 12 — ABNORMAL HIGH
Neutro Abs: 1.9
Neutrophils Relative %: 58

## 2011-03-02 LAB — CBC
HCT: 34.4 — ABNORMAL LOW
Hemoglobin: 12.1
MCHC: 35.2
MCV: 82.4
Platelets: 207
RBC: 4.17
RDW: 14
WBC: 3.3 — ABNORMAL LOW

## 2011-03-02 LAB — BASIC METABOLIC PANEL
BUN: 9
CO2: 27
Calcium: 8.6
Chloride: 104
Creatinine, Ser: 0.92
GFR calc Af Amer: >60
GFR calc non Af Amer: >60
Glucose, Bld: 99
Potassium: 3 — ABNORMAL LOW
Sodium: 140

## 2011-04-17 ENCOUNTER — Other Ambulatory Visit (INDEPENDENT_AMBULATORY_CARE_PROVIDER_SITE_OTHER): Payer: BC Managed Care – PPO

## 2011-04-17 ENCOUNTER — Telehealth: Payer: Self-pay | Admitting: *Deleted

## 2011-04-17 DIAGNOSIS — E7800 Pure hypercholesterolemia, unspecified: Secondary | ICD-10-CM

## 2011-04-17 LAB — LIPID PANEL
Cholesterol: 217 mg/dL — ABNORMAL HIGH (ref 0–200)
HDL: 41.1 mg/dL (ref >39.00)
Total CHOL/HDL Ratio: 5
Triglycerides: 177 mg/dL — ABNORMAL HIGH (ref 0.0–149.0)
VLDL: 35.4 mg/dL (ref 0.0–40.0)

## 2011-04-17 LAB — LDL CHOLESTEROL, DIRECT: Direct LDL: 150.5 mg/dL

## 2011-04-17 NOTE — Telephone Encounter (Signed)
Pt was in for f/u labs this am. She is also c/o pain in center of back that started 2 days ago it is worse in the am after waking up but gets better as day progresses. Pain increases when in certain positions and sometimes radiates to R shoulder/ arm. Denies any other pain, nausea/vomiting.  She does get sob, but states it is only with extended exertion. She has been taking alleve and it seems to help. She did not appear in any distress and refused appointment when offered, but would like your advise on how to treat muscle strain/pain.

## 2011-04-17 NOTE — Telephone Encounter (Signed)
Patient advised.

## 2011-04-17 NOTE — Telephone Encounter (Signed)
Gentle stretching, heat and antiinflammatories like aleve.  If not improving make appt to be seen.

## 2011-05-08 ENCOUNTER — Encounter: Payer: Self-pay | Admitting: Family Medicine

## 2011-05-08 ENCOUNTER — Ambulatory Visit (INDEPENDENT_AMBULATORY_CARE_PROVIDER_SITE_OTHER): Payer: BC Managed Care – PPO | Admitting: Family Medicine

## 2011-05-08 VITALS — BP 130/82 | HR 66 | Temp 98.7°F | Ht 68.0 in | Wt 211.8 lb

## 2011-05-08 DIAGNOSIS — R0989 Other specified symptoms and signs involving the circulatory and respiratory systems: Secondary | ICD-10-CM

## 2011-05-08 DIAGNOSIS — E7800 Pure hypercholesterolemia, unspecified: Secondary | ICD-10-CM

## 2011-05-08 DIAGNOSIS — R0789 Other chest pain: Secondary | ICD-10-CM

## 2011-05-08 DIAGNOSIS — R0609 Other forms of dyspnea: Secondary | ICD-10-CM

## 2011-05-08 DIAGNOSIS — K219 Gastro-esophageal reflux disease without esophagitis: Secondary | ICD-10-CM

## 2011-05-08 DIAGNOSIS — R0600 Dyspnea, unspecified: Secondary | ICD-10-CM | POA: Insufficient documentation

## 2011-05-08 DIAGNOSIS — Z8249 Family history of ischemic heart disease and other diseases of the circulatory system: Secondary | ICD-10-CM

## 2011-05-08 MED ORDER — OMEPRAZOLE 20 MG PO CPDR: 20.0000 mg | DELAYED_RELEASE_CAPSULE | Freq: Every day | ORAL | Status: DC

## 2011-05-08 NOTE — Progress Notes (Signed)
  Subjective:    Patient ID: Jo White, female    DOB: 1950/11/15, 60 y.o.   MRN: [id number]  HPI Elevated Cholesterol: No change in cholesterol since 08/2010.  LDL not at goal <130. Trig remain high although slightly lower Not on any med currently. Diet compliance: Has made some minor changes in diet, no fried food. HAs not lost any weight. Exercise: None.. Working a lot. Other complaints: See HPI for multiple new complaints    Review of Systems  Constitutional: Negative for fever and fatigue.  HENT: Negative for ear pain.   Eyes: Negative for pain.  Respiratory: Negative for chest tightness and shortness of breath.        Winded when walking up stairs  Cardiovascular: Negative for chest pain, palpitations and leg swelling.       No exertional chest pain Father died age 20 from MI but had nephritits, mom with CHF, passed away age 72  Gastrointestinal: Negative for abdominal pain.       Occ burning indigestion in chest and throat.. Using zantac, goes away. Occuring 2-3 times a week.     Genitourinary: Negative for dysuria.  Musculoskeletal:       Foot cramps   occ pain in mid back  on right.. Likely MSK.       Objective:   Physical Exam  Constitutional:       Overweight in NAD  HENT:  Head: Normocephalic.  Eyes: Conjunctivae are normal. Pupils are equal, round, and reactive to light.  Neck: Normal range of motion. Neck supple. Muscular tenderness present. No spinous process tenderness present. No thyromegaly present.       Lipoma right neck, mildly tender  Cardiovascular: Normal rate, regular rhythm, normal heart sounds and intact distal pulses.  Exam reveals no gallop and no friction rub.   No murmur heard. Pulmonary/Chest: Effort normal and breath sounds normal. No respiratory distress. She has no wheezes. She has no rales. She exhibits no tenderness.  Abdominal: Soft. Bowel sounds are normal. She exhibits no distension and no mass. There is no tenderness. There is  no rebound and no guarding.  Musculoskeletal:       Cervical back: Normal.       Thoracic back: She exhibits tenderness. She exhibits no bony tenderness.       Lateral paraspinous muscle tenderness to right.          Assessment & Plan:

## 2011-05-08 NOTE — Assessment & Plan Note (Signed)
Likely due to deconditioning. Exam nml. No sign of infectious cause.

## 2011-05-08 NOTE — Patient Instructions (Addendum)
Follow up for CPX with labs prior in 09/2010.  Avoid triggers for reflux...decrease caffeine or stop, avoid citris, tomato, spicy. Don't late a night, try to eat small meals frequently.  Prilosec 20 mg daily for 4-6 weeks while making lifestyle changes, then wean off.  Consider [employer] low carb, low fat diet.  Stop at front desk to schedule exercise stress test. If normal begin exercise routine.   Heat and gentle stretching for upper back pain.

## 2011-05-08 NOTE — Assessment & Plan Note (Signed)
Poor control but she has made minimal changes to lifestyle. She is hesitant about exercise due to other issues and family history.

## 2011-05-08 NOTE — Assessment & Plan Note (Signed)
Prilosec x 4-6 week while lifestyle changes are made.

## 2011-05-08 NOTE — Assessment & Plan Note (Addendum)
Most likely due to GERD, but given risk factors including age, fam hx, and high chol... Will eval with EKG. EKG showed: NSR, no ST changes, no LVH, no Q. Will move forward with exercise stress test to eval further given risk and pt wish to start exercise regimen.

## 2011-06-01 ENCOUNTER — Encounter: Payer: Self-pay | Admitting: Cardiovascular Disease

## 2011-06-01 ENCOUNTER — Ambulatory Visit (INDEPENDENT_AMBULATORY_CARE_PROVIDER_SITE_OTHER): Payer: BC Managed Care – PPO | Admitting: Cardiovascular Disease

## 2011-06-01 DIAGNOSIS — E7800 Pure hypercholesterolemia, unspecified: Secondary | ICD-10-CM

## 2011-06-01 DIAGNOSIS — R0789 Other chest pain: Secondary | ICD-10-CM

## 2011-06-01 NOTE — Progress Notes (Signed)
Exercise Treadmill Test  Treadmill ordered for recent epsiodes of indigestion.  Resting EKG shows NSR with rate of 69 bpm, no Significant ST or T wave changes Resting blood pressure of 120/84. Stand bruce protocal was used.  Patient exercised for 7 min 18 sec,  Peak heart rate of 168 bpm.  This was 104% of the maximum predicted heart rate (target heart rate 160). Achieved 10.1 METS No symptoms of chest pain or lightheadedness were reported at peak stress or in recovery.  Peak Blood pressure recorded was 180/90. Heart rate at 3 minutes in recovery was 98 bpm. No significant ST changes concerning for ischemia  FINAL IMPRESSION: Normal exercise stress test. No significant EKG changes concerning for ischemia. Good exercise tolerance. Heart rate did climb to the low 100s with minimal exertion.  We have encouraged a regular exercise program.

## 2011-06-01 NOTE — Patient Instructions (Signed)
The stress test showed no significant ischemia. No further workup is needed at this time. Please start a regular exercise program for conditioning and weight loss.

## 2011-10-29 ENCOUNTER — Encounter: Payer: Self-pay | Admitting: Family Medicine

## 2011-10-29 ENCOUNTER — Ambulatory Visit (INDEPENDENT_AMBULATORY_CARE_PROVIDER_SITE_OTHER): Payer: BC Managed Care – PPO | Admitting: Family Medicine

## 2011-10-29 VITALS — BP 120/72 | HR 64 | Temp 97.7°F | Ht 67.0 in | Wt 212.5 lb

## 2011-10-29 DIAGNOSIS — R300 Dysuria: Secondary | ICD-10-CM

## 2011-10-29 DIAGNOSIS — N390 Urinary tract infection, site not specified: Secondary | ICD-10-CM

## 2011-10-29 LAB — POCT URINALYSIS DIPSTICK
Bilirubin, UA: NEGATIVE
Glucose, UA: NEGATIVE
Ketones, UA: NEGATIVE
Nitrite, UA: NEGATIVE
Protein, UA: 100
Spec Grav, UA: 1.01
Urobilinogen, UA: NEGATIVE
pH, UA: 7.5

## 2011-10-29 MED ORDER — CIPROFLOXACIN HCL 250 MG PO TABS: 250.0000 mg | ORAL_TABLET | Freq: Two times a day (BID) | ORAL | Status: AC

## 2011-10-29 NOTE — Progress Notes (Signed)
   Nature conservation officer at [facility] [address] [place] [address] Phone: [phone] Fax: [phone]   Patient Name: Jo White Date of Birth: 09-Feb-1951 Age: 61 y.o. Medical Record Number: [id number] Gender: female Date of Encounter: 10/29/2011  History of Present Illness:  Jo White is a 61 y.o. very pleasant female patient who presents with the following:  Leaving Monday morning for [place] Felt some urgency and some blood through sat and sun Urgency, dysuria Now with some back / flank pain Gross hematuria  Felt ok thorugh Monday. Then this morning.   [phone] [facility] [phone]   Past Medical History, Surgical History, Social History, Family History, Problem List, Medications, and Allergies have been reviewed and updated if relevant.  Prior to Admission medications   Medication Sig Start Date End Date Taking? Authorizing Provider  omeprazole (PRILOSEC) 20 MG capsule Take 1 capsule (20 mg total) by mouth daily. 05/08/11 05/07/12 Yes Amy Michelle Nasuti, MD    Review of Systems: ROS: GEN: Acute illness details above GI: Tolerating PO intake GU: maintaining adequate hydration and urination Pulm: No SOB Interactive and getting along well at home.  Otherwise, ROS is as per the HPI.   Physical Examination: Filed Vitals:   10/29/11 1340  BP: 120/72  Pulse: 64  Temp: 97.7 F (36.5 C)   Filed Vitals:   10/29/11 1340  Height: 5\' 7"  (1.702 m)  Weight: 212 lb 8 oz (96.389 kg)   Body mass index is 33.28 kg/(m^2). Ideal Body Weight: Weight in (lb) to have BMI = 25: 159.3    GEN: WDWN, A&Ox4,NAD. Non-toxic HEENT: Atraumatc, normocephalic. CV: RRR, No M/G/R PULM: CTA B, No wheezes, crackles, or rhonchi ABD: S, NT, ND, +BS, no rebound. No CVAT. + suprapubic tenderness. EXT: No c/c/e   Assessment and Plan:  1. UTI (lower urinary tract infection)  Urine Culture  2. Dysuria  POCT Urinalysis Dipstick   Worry this is progressing to early pyelo  with back pain, check cx and 7 d  Orders Today: Orders Placed This Encounter  Procedures  . Urine Culture  . POCT Urinalysis Dipstick    Medications Today: Meds ordered this encounter  Medications  . ciprofloxacin (CIPRO) 250 MG tablet    Sig: Take 1 tablet (250 mg total) by mouth 2 (two) times daily.    Dispense:  14 tablet    Refill:  0     Hannah Beat, MD

## 2011-10-31 LAB — URINE CULTURE: Colony Count: >=100000

## 2011-12-23 ENCOUNTER — Ambulatory Visit: Payer: BC Managed Care – PPO | Admitting: Internal Medicine

## 2012-01-11 ENCOUNTER — Encounter: Payer: Self-pay | Admitting: Internal Medicine

## 2012-01-11 ENCOUNTER — Ambulatory Visit (INDEPENDENT_AMBULATORY_CARE_PROVIDER_SITE_OTHER): Payer: BC Managed Care – PPO | Admitting: Internal Medicine

## 2012-01-11 VITALS — BP 132/74 | HR 74 | Temp 98.2°F | Resp 16 | Ht 66.0 in | Wt 213.2 lb

## 2012-01-11 DIAGNOSIS — D1739 Benign lipomatous neoplasm of skin and subcutaneous tissue of other sites: Secondary | ICD-10-CM

## 2012-01-11 DIAGNOSIS — R5381 Other malaise: Secondary | ICD-10-CM

## 2012-01-11 DIAGNOSIS — R5383 Other fatigue: Secondary | ICD-10-CM

## 2012-01-11 DIAGNOSIS — E785 Hyperlipidemia, unspecified: Secondary | ICD-10-CM

## 2012-01-11 DIAGNOSIS — Z1239 Encounter for other screening for malignant neoplasm of breast: Secondary | ICD-10-CM

## 2012-01-11 DIAGNOSIS — D709 Neutropenia, unspecified: Secondary | ICD-10-CM

## 2012-01-11 DIAGNOSIS — K219 Gastro-esophageal reflux disease without esophagitis: Secondary | ICD-10-CM

## 2012-01-11 DIAGNOSIS — D170 Benign lipomatous neoplasm of skin and subcutaneous tissue of head, face and neck: Secondary | ICD-10-CM

## 2012-01-11 MED ORDER — MELOXICAM 15 MG PO TABS: 15.0000 mg | ORAL_TABLET | Freq: Every day | ORAL | Status: AC

## 2012-01-11 NOTE — Patient Instructions (Addendum)
Referral to Dr. Lemar Livings for removal of your lipoma.   Consider a Low Glycemic Index Diet and eating 6 smaller meals daily .  This frequent feeding stimulates your metabolism and the lower glycemic index foods will lower your triglycerides.  Also aim for  30 minutes of aerobic exercise 5 times daily.    This is an example of my daily  "Low GI"  Diet:  All of the foods can be found at grocery stores and in bulk at [employer]  club   7 AM Breakfast:  Low carbohydrate Protein  Shakes (I recommend the EAS AdvantEdge "Carb Control" shakes  Or the low carb shakes by Atkins.   Both are available everywhere:  In  cases at [employer]  Or in 4 packs at grocery stores and pharmacies  2.5 carbs  (Alternative is  a toasted Arnold's Sandwhich Thin w/ peanut butter, a "Bagel Thin" with cream cheese and salmon) or  a scrambled egg burrito made with a low carb tortilla .  Avoid cereal and bananas, oatmeal too unless the old fashioned kind that takes 30-40 minutes to prepare.  the rest is overly processed, has minimal fiber, and loaded with carbohydrates!   10 AM: Protein bar by Atkins (the snack size, under 200 cal.  There are many varieties , available widely again or in bulk in limited varieties at [employer]Js)  Other so called "protein bars" tend to be loaded with carbohydrates.  Remember, in food advertising, the word "energy" is synonymous for " carbohydrate."  Lunch: sandwich of [place], (or any lunchmeat or canned tuna), fresh avocado and cheese on a lower carbohydrate pita bread, flatbread, or tortilla . Ok to use mayonnaise. The bread is the only source or carbohydrate that can be decreased (Joseph's makes a pita bread and a flat bread  Are 50 cal and 4 net carbs ; Toufayan makes a low carb flatbread 100 cal and 9 net carbs  and  Mission makes a low carb whole wheat tortilla  210 cal and 6 net carbs)  3 PM:  Mid day :  Another protein bar,  Or a  cheese stick (100 cal, 0 carbs),  Or 1 ounce of  almonds, walnuts, pistachios, pecans,  peanuts,  Macadamia nuts. Or a Dannon light n Fit greek yogurt, 80 cal 8 net carbs . Avoid "granola"; the dried cranberries and raisins are loaded with carbohydrates.    6 PM  Dinner:  "mean and green:"  Meat/chicken/fish or a high protein legume; , with a green salad, and a low GI  Veggie (broccoli, cauliflower, green beans, spinach, brussel sprouts. Lima beans) : Avoid "Low fat dressings, Reyne Dumas and [address]! They are loaded with sugar! Instead use ranch, vinagrette,  Blue cheese, etc  9 PM snack : Breyer's "low carb" fudgsicle or  ice cream bar (Carb Smart line), or  Weight Watcher's ice cream bar , or anouther "no sugar added" ice cream; or another protein shake or a serving of fresh fruit with whipped cream (Avoid bananas, pineapple, grapes  and watermelon on a regular basis because they are high in sugar)   Remember that snack Substitutions should be less than 15 to 20 carbs  Per serving. Remember to subtract fiber grams to get the "net carbs."

## 2012-01-11 NOTE — Progress Notes (Signed)
Patient ID: Jo White, female   DOB: Apr 19, 1951, 61 y.o.   MRN: [id number] Patient Active Problem List  Diagnosis  . ANEMIA, IRON DEFICIENCY NOS  . HEMORRHOIDS, INTERNAL, WITH BLEEDING  . EXTERNAL HEMORRHOIDS  . HEART MURMUR, HX OF  . High cholesterol  . Osteopenia  . Atypical chest pain  . Family history of MI (myocardial infarction)  . DOE (dyspnea on exertion)  . GERD (gastroesophageal reflux disease)    Subjective:  CC:   Chief Complaint  Patient presents with  . Establish Care    HPI:   Jo White is a 61 y.o. female who presents as a new patient to establish primary care with the chief complaint of   eflux occurring 2 or 3 times weekly managed with prn zantac.  Her stress test was normal in January. 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 She has occasional neck pain  With decreased ROM.  She wears a shoulder purse on the right  Has a large lipoma on her neck.  Prior breast biopsy by Adela Glimpse.  Right breast FNA normal.  Due for mammogram.    Past Medical History  Diagnosis Date  . Iron deficiency anemia, unspecified   . Internal hemorrhoids with other complication   . Otalgia, unspecified   . Swelling, mass, or lump in head and neck   . Blood in stool   . External hemorrhoids without mention of complication   . Personal history of unspecified circulatory disease     Past Surgical History  Procedure Date  . Fallopian tube removal     r tube  . Appendectomy   . In vitro fertilization 1985  . Breast biopsy     right  . Hemorrhoid surgery 11/2006    Family History  Problem Relation Age of Onset  . Heart disease Father   . Heart attack Father   . Hypertension Mother   . Bradycardia Mother   . Heart failure Mother   . Colon polyps Mother   . Diabetes Sister   . Hyperlipidemia Sister   . Cancer Sister     breast  . Hypertension Brother   . Diabetes Paternal Grandmother     . Diabetes Sister     History   Social History  . Marital Status: Married    Spouse Name: N/A    Number of Children: 2  . Years of Education: N/A   Occupational History  . [employer]    Social History Main Topics  . Smoking status: Former Smoker    Types: Cigarettes    Quit date: 01/11/1972  . Smokeless tobacco: Never Used   Comment: 1 1/2 years as a teen  . Alcohol Use: Yes     rare alcohol intake  . Drug Use: No  . Sexually Active: Not on file   Other Topics Concern  . Not on file   Social History Narrative   No regular exercise, some walkingDiet- some fruit and veggies, avoid fast food         @ALLHX @    Review of Systems:   The remainder of the review of systems was negative except those addressed in the HPI.       Objective:  BP 132/74  Pulse 74  Temp 98.2 F (36.8 C) (Oral)  Resp 16  Ht 5\' 6"  (1.676 m)  Wt 213 lb 4 oz (96.73 kg)  BMI 34.42 kg/m2  SpO2 97%  General appearance: alert, cooperative and appears stated age Ears: normal TM's and external ear canals both ears Throat: lips, mucosa, and tongue normal; teeth and gums normal Neck: no adenopathy, no carotid bruit, supple, symmetrical, trachea midline and thyroid not enlarged, symmetric, no tenderness/mass/nodules Back: symmetric, no curvature. ROM normal. No CVA tenderness. Lungs: clear to auscultation bilaterally Heart: regular rate and rhythm, S1, S2 normal, no murmur, click, rub or gallop Abdomen: soft, non-tender; bowel sounds normal; no masses,  no organomegaly Pulses: 2+ and symmetric Skin: Skin color, texture, turgor normal. No rashes or lesions Lymph nodes: Cervical, supraclavicular, and axillary nodes normal.  Assessment and Plan: GERD (gastroesophageal reflux disease) Managed with prn zantac  Lipoma of neck Large, with recent enlargement. May be interfering with movement.  Referral to Kathrin Ruddy for removal.    Updated Medication List Outpatient Encounter  Prescriptions as of 01/11/2012  Medication Sig Dispense Refill  . meloxicam (MOBIC) 15 MG tablet Take 1 tablet (15 mg total) by mouth daily.  30 tablet  0  . DISCONTD: omeprazole (PRILOSEC) 20 MG capsule Take 1 capsule (20 mg total) by mouth daily.  30 capsule  3

## 2012-01-12 ENCOUNTER — Encounter: Payer: Self-pay | Admitting: Internal Medicine

## 2012-01-12 DIAGNOSIS — D170 Benign lipomatous neoplasm of skin and subcutaneous tissue of head, face and neck: Secondary | ICD-10-CM | POA: Insufficient documentation

## 2012-01-12 NOTE — Assessment & Plan Note (Signed)
Large, with recent enlargement. May be interfering with movement.  Referral to [facility] for removal.

## 2012-01-12 NOTE — Assessment & Plan Note (Signed)
Managed with prn zantac

## 2012-01-15 ENCOUNTER — Other Ambulatory Visit (INDEPENDENT_AMBULATORY_CARE_PROVIDER_SITE_OTHER): Payer: BC Managed Care – PPO | Admitting: *Deleted

## 2012-01-15 DIAGNOSIS — E785 Hyperlipidemia, unspecified: Secondary | ICD-10-CM

## 2012-01-15 DIAGNOSIS — D709 Neutropenia, unspecified: Secondary | ICD-10-CM

## 2012-01-15 DIAGNOSIS — R5381 Other malaise: Secondary | ICD-10-CM

## 2012-01-15 DIAGNOSIS — R5383 Other fatigue: Secondary | ICD-10-CM

## 2012-01-15 LAB — TSH: TSH: 1.87 u[IU]/mL (ref 0.35–5.50)

## 2012-01-15 LAB — COMPREHENSIVE METABOLIC PANEL
ALT: 15 U/L (ref 0–35)
AST: 16 U/L (ref 0–37)
Albumin: 3.8 g/dL (ref 3.5–5.2)
Alkaline Phosphatase: 68 U/L (ref 39–117)
BUN: 10 mg/dL (ref 6–23)
CO2: 28 mEq/L (ref 19–32)
Calcium: 8.7 mg/dL (ref 8.4–10.5)
Chloride: 107 mEq/L (ref 96–112)
Creatinine, Ser: 0.9 mg/dL (ref 0.4–1.2)
GFR: 66.08 mL/min (ref >60.00)
Glucose, Bld: 92 mg/dL (ref 70–99)
Potassium: 4.4 mEq/L (ref 3.5–5.1)
Sodium: 141 mEq/L (ref 135–145)
Total Bilirubin: 0.4 mg/dL (ref 0.3–1.2)
Total Protein: 6.8 g/dL (ref 6.0–8.3)

## 2012-01-15 LAB — CBC WITH DIFFERENTIAL/PLATELET
Basophils Absolute: 0 10*3/uL (ref 0.0–0.1)
Basophils Relative: 0.4 % (ref 0.0–3.0)
Eosinophils Absolute: 0.1 10*3/uL (ref 0.0–0.7)
Eosinophils Relative: 3 % (ref 0.0–5.0)
HCT: 36.5 % (ref 36.0–46.0)
Hemoglobin: 12.2 g/dL (ref 12.0–15.0)
Lymphocytes Relative: 31.8 % (ref 12.0–46.0)
Lymphs Abs: 1.3 10*3/uL (ref 0.7–4.0)
MCHC: 33.5 g/dL (ref 30.0–36.0)
MCV: 87.1 fl (ref 78.0–100.0)
Monocytes Absolute: 0.4 10*3/uL (ref 0.1–1.0)
Monocytes Relative: 9.3 % (ref 3.0–12.0)
Neutro Abs: 2.3 10*3/uL (ref 1.4–7.7)
Neutrophils Relative %: 55.5 % (ref 43.0–77.0)
Platelets: 187 10*3/uL (ref 150.0–400.0)
RBC: 4.19 Mil/uL (ref 3.87–5.11)
RDW: 14.1 % (ref 11.5–14.6)
WBC: 4.1 10*3/uL — ABNORMAL LOW (ref 4.5–10.5)

## 2012-01-15 LAB — LIPID PANEL
Cholesterol: 203 mg/dL — ABNORMAL HIGH (ref 0–200)
HDL: 39.4 mg/dL (ref >39.00)
Total CHOL/HDL Ratio: 5
Triglycerides: 200 mg/dL — ABNORMAL HIGH (ref 0.0–149.0)
VLDL: 40 mg/dL (ref 0.0–40.0)

## 2012-01-15 LAB — VITAMIN B12: Vitamin B-12: 115 pg/mL — ABNORMAL LOW (ref 211–911)

## 2012-01-15 LAB — LDL CHOLESTEROL, DIRECT: Direct LDL: 137.8 mg/dL

## 2012-01-15 LAB — HM MAMMOGRAPHY: HM Mammogram: NORMAL

## 2012-01-15 NOTE — Addendum Note (Signed)
Addended by: Jobie Quaker on: 01/15/2012 12:28 PM   Modules accepted: Orders

## 2012-01-25 ENCOUNTER — Encounter: Payer: Self-pay | Admitting: Internal Medicine

## 2012-02-12 ENCOUNTER — Telehealth: Payer: Self-pay | Admitting: Internal Medicine

## 2012-02-12 DIAGNOSIS — D170 Benign lipomatous neoplasm of skin and subcutaneous tissue of head, face and neck: Secondary | ICD-10-CM

## 2012-02-12 NOTE — Telephone Encounter (Signed)
Pt is wondering about referral to be set up with Dr. Rosezetta Schlatter to have place on neck removed ??

## 2012-04-12 ENCOUNTER — Ambulatory Visit: Payer: BC Managed Care – PPO | Admitting: Internal Medicine

## 2012-10-18 ENCOUNTER — Telehealth: Payer: Self-pay | Admitting: Internal Medicine

## 2012-10-18 DIAGNOSIS — R5381 Other malaise: Secondary | ICD-10-CM

## 2012-10-18 DIAGNOSIS — Z79899 Other long term (current) drug therapy: Secondary | ICD-10-CM

## 2012-10-18 DIAGNOSIS — E538 Deficiency of other specified B group vitamins: Secondary | ICD-10-CM

## 2012-10-18 DIAGNOSIS — E559 Vitamin D deficiency, unspecified: Secondary | ICD-10-CM

## 2012-10-18 DIAGNOSIS — Z13220 Encounter for screening for lipoid disorders: Secondary | ICD-10-CM

## 2012-10-18 DIAGNOSIS — D649 Anemia, unspecified: Secondary | ICD-10-CM

## 2012-10-18 DIAGNOSIS — R5383 Other fatigue: Secondary | ICD-10-CM

## 2012-10-18 NOTE — Telephone Encounter (Signed)
Patient needing labs entered int the system before her physical on 7.8.14.

## 2012-10-19 NOTE — Telephone Encounter (Signed)
Labs oredered for pre physical blood draw.    Has patient been takig B12?  She was deficient last September but we were out of B12 injections and it appeasr we dropped the ball on calling her back in.  Has she been taking any in any form?

## 2012-11-15 ENCOUNTER — Other Ambulatory Visit (INDEPENDENT_AMBULATORY_CARE_PROVIDER_SITE_OTHER): Payer: BC Managed Care – PPO

## 2012-11-15 DIAGNOSIS — R5383 Other fatigue: Secondary | ICD-10-CM

## 2012-11-15 DIAGNOSIS — Z13220 Encounter for screening for lipoid disorders: Secondary | ICD-10-CM

## 2012-11-15 DIAGNOSIS — E559 Vitamin D deficiency, unspecified: Secondary | ICD-10-CM

## 2012-11-15 DIAGNOSIS — Z79899 Other long term (current) drug therapy: Secondary | ICD-10-CM

## 2012-11-15 DIAGNOSIS — R5381 Other malaise: Secondary | ICD-10-CM

## 2012-11-15 DIAGNOSIS — E538 Deficiency of other specified B group vitamins: Secondary | ICD-10-CM

## 2012-11-15 DIAGNOSIS — D649 Anemia, unspecified: Secondary | ICD-10-CM

## 2012-11-15 LAB — CBC WITH DIFFERENTIAL/PLATELET
Basophils Absolute: 0 10*3/uL (ref 0.0–0.1)
Basophils Relative: 0.5 % (ref 0.0–3.0)
Eosinophils Absolute: 0.2 10*3/uL (ref 0.0–0.7)
Eosinophils Relative: 3.4 % (ref 0.0–5.0)
HCT: 37 % (ref 36.0–46.0)
Hemoglobin: 12.6 g/dL (ref 12.0–15.0)
Lymphocytes Relative: 32.6 % (ref 12.0–46.0)
Lymphs Abs: 1.5 10*3/uL (ref 0.7–4.0)
MCHC: 34 g/dL (ref 30.0–36.0)
MCV: 87.9 fl (ref 78.0–100.0)
Monocytes Absolute: 0.4 10*3/uL (ref 0.1–1.0)
Monocytes Relative: 9.2 % (ref 3.0–12.0)
Neutro Abs: 2.5 10*3/uL (ref 1.4–7.7)
Neutrophils Relative %: 54.3 % (ref 43.0–77.0)
Platelets: 203 10*3/uL (ref 150.0–400.0)
RBC: 4.21 Mil/uL (ref 3.87–5.11)
RDW: 13.3 % (ref 11.5–14.6)
WBC: 4.6 10*3/uL (ref 4.5–10.5)

## 2012-11-15 LAB — COMPREHENSIVE METABOLIC PANEL
ALT: 16 U/L (ref 0–35)
AST: 19 U/L (ref 0–37)
Albumin: 3.8 g/dL (ref 3.5–5.2)
Alkaline Phosphatase: 80 U/L (ref 39–117)
BUN: 12 mg/dL (ref 6–23)
CO2: 28 mEq/L (ref 19–32)
Calcium: 8.9 mg/dL (ref 8.4–10.5)
Chloride: 108 mEq/L (ref 96–112)
Creatinine, Ser: 0.9 mg/dL (ref 0.4–1.2)
GFR: 72.2 mL/min (ref >60.00)
Glucose, Bld: 108 mg/dL — ABNORMAL HIGH (ref 70–99)
Potassium: 4.6 mEq/L (ref 3.5–5.1)
Sodium: 141 mEq/L (ref 135–145)
Total Bilirubin: 0.5 mg/dL (ref 0.3–1.2)
Total Protein: 6.6 g/dL (ref 6.0–8.3)

## 2012-11-15 LAB — LIPID PANEL
Cholesterol: 214 mg/dL — ABNORMAL HIGH (ref 0–200)
HDL: 37.7 mg/dL — ABNORMAL LOW (ref >39.00)
Total CHOL/HDL Ratio: 6
Triglycerides: 153 mg/dL — ABNORMAL HIGH (ref 0.0–149.0)
VLDL: 30.6 mg/dL (ref 0.0–40.0)

## 2012-11-15 LAB — VITAMIN B12: Vitamin B-12: 120 pg/mL — ABNORMAL LOW (ref 211–911)

## 2012-11-15 LAB — LDL CHOLESTEROL, DIRECT: Direct LDL: 158 mg/dL

## 2012-11-15 LAB — TSH: TSH: 2.47 u[IU]/mL (ref 0.35–5.50)

## 2012-11-16 ENCOUNTER — Encounter: Payer: Self-pay | Admitting: Internal Medicine

## 2012-11-16 ENCOUNTER — Other Ambulatory Visit: Payer: Self-pay | Admitting: Internal Medicine

## 2012-11-16 DIAGNOSIS — E559 Vitamin D deficiency, unspecified: Secondary | ICD-10-CM | POA: Insufficient documentation

## 2012-11-16 DIAGNOSIS — D519 Vitamin B12 deficiency anemia, unspecified: Secondary | ICD-10-CM | POA: Insufficient documentation

## 2012-11-16 LAB — VITAMIN D 25 HYDROXY (VIT D DEFICIENCY, FRACTURES): Vit D, 25-Hydroxy: 23 ng/mL — ABNORMAL LOW (ref 30–89)

## 2012-11-16 MED ORDER — ERGOCALCIFEROL 1.25 MG (50000 UT) PO CAPS: 50000.0000 [IU] | ORAL_CAPSULE | ORAL | Status: DC

## 2012-11-22 ENCOUNTER — Encounter: Payer: Self-pay | Admitting: Internal Medicine

## 2012-11-22 ENCOUNTER — Ambulatory Visit (INDEPENDENT_AMBULATORY_CARE_PROVIDER_SITE_OTHER): Payer: BC Managed Care – PPO | Admitting: Internal Medicine

## 2012-11-22 ENCOUNTER — Other Ambulatory Visit (HOSPITAL_COMMUNITY)
Admission: RE | Admit: 2012-11-22 | Discharge: 2012-11-22 | Disposition: A | Discharge status: Home / Self Care | Payer: BC Managed Care – PPO | Source: Ambulatory Visit | Attending: Internal Medicine | Admitting: Internal Medicine

## 2012-11-22 VITALS — BP 132/84 | HR 75 | Temp 98.4°F | Resp 14 | Ht 67.0 in | Wt 218.8 lb

## 2012-11-22 DIAGNOSIS — D518 Other vitamin B12 deficiency anemias: Secondary | ICD-10-CM

## 2012-11-22 DIAGNOSIS — D509 Iron deficiency anemia, unspecified: Secondary | ICD-10-CM

## 2012-11-22 DIAGNOSIS — Z01419 Encounter for gynecological examination (general) (routine) without abnormal findings: Secondary | ICD-10-CM | POA: Insufficient documentation

## 2012-11-22 DIAGNOSIS — E559 Vitamin D deficiency, unspecified: Secondary | ICD-10-CM

## 2012-11-22 DIAGNOSIS — D519 Vitamin B12 deficiency anemia, unspecified: Secondary | ICD-10-CM

## 2012-11-22 DIAGNOSIS — Z124 Encounter for screening for malignant neoplasm of cervix: Secondary | ICD-10-CM

## 2012-11-22 DIAGNOSIS — Z0000 Encounter for general adult medical examination without abnormal findings: Secondary | ICD-10-CM

## 2012-11-22 DIAGNOSIS — Z1151 Encounter for screening for human papillomavirus (HPV): Secondary | ICD-10-CM | POA: Insufficient documentation

## 2012-11-22 DIAGNOSIS — E7800 Pure hypercholesterolemia, unspecified: Secondary | ICD-10-CM

## 2012-11-22 MED ORDER — CYANOCOBALAMIN 1000 MCG/ML IJ SOLN: 1000.0000 ug | INTRAMUSCULAR | Status: DC

## 2012-11-22 MED ORDER — CYANOCOBALAMIN 1000 MCG/ML IJ SOLN
1000.0000 ug | Freq: Once | INTRAMUSCULAR | Status: AC
  Administered 2012-11-22: 1000 ug via INTRAMUSCULAR

## 2012-11-22 MED ORDER — SYRINGE (DISPOSABLE) 1 ML MISC: Status: DC

## 2012-11-22 MED ORDER — ZOSTER VACCINE LIVE 19400 UNT/0.65ML ~~LOC~~ SOLR: 0.6500 mL | Freq: Once | SUBCUTANEOUS | Status: DC

## 2012-11-22 NOTE — Progress Notes (Signed)
Patient ID: Jo White, female   DOB: [birth year], 62 y.o.   MRN: [id number]  Annual exam   b12 and vit d deficiency ,  Cholesterol on recent labs  Subjective:     Jo White is a 62 y.o. female and is here for a comprehensive physical exam. The patient reports no problems.  History   Social History  . Marital Status: Married    Spouse Name: N/A    Number of Children: 2  . Years of Education: N/A   Occupational History  . [employer]    Social History Main Topics  . Smoking status: Former Smoker    Types: Cigarettes    Quit date: 01/11/1972  . Smokeless tobacco: Never Used     Comment: 1 1/2 years as a teen  . Alcohol Use: Yes     Comment: rare alcohol intake  . Drug Use: No  . Sexually Active: Not on file   Other Topics Concern  . Not on file   Social History Narrative   No regular exercise, some walking   Diet- some fruit and veggies, avoid fast food   Health Maintenance  Topic Date Due  . Zostavax  08/03/2011  . Mammogram  01/14/2013  . Influenza Vaccine  01/16/2013  . Pap Smear  11/23/2015  . Colonoscopy  01/11/2016  . Tetanus/tdap  11/22/2021    The following portions of the patient's history were reviewed and updated as appropriate: allergies, current medications, past family history, past medical history, past social history, past surgical history and problem list.  Review of Systems A comprehensive review of systems was negative.   Objective:   BP 132/84  Pulse 75  Temp(Src) 98.4 F (36.9 C) (Oral)  Resp 14  Ht 5\' 7"  (1.702 m)  Wt 218 lb 12 oz (99.224 kg)  BMI 34.25 kg/m2  SpO2 98%  General Appearance:    Alert, cooperative, no distress, appears stated age  Head:    Normocephalic, without obvious abnormality, atraumatic  Eyes:    PERRL, conjunctiva/corneas clear, EOM's intact, fundi    benign, both eyes  Ears:    Normal TM's and external ear canals, both ears  Nose:   Nares normal, septum midline, mucosa normal, no drainage    or sinus  tenderness  Throat:   Lips, mucosa, and tongue normal; teeth and gums normal  Neck:   Supple, symmetrical, trachea midline, no adenopathy;    thyroid:  no enlargement/tenderness/nodules; no carotid   bruit or JVD  Back:     Symmetric, no curvature, ROM normal, no CVA tenderness  Lungs:     Clear to auscultation bilaterally, respirations unlabored  Chest Wall:    No tenderness or deformity   Heart:    Regular rate and rhythm, S1 and S2 normal, no murmur, rub   or gallop  Breast Exam:    No tenderness, masses, or nipple abnormality  Abdomen:     Soft, non-tender, bowel sounds active all four quadrants,    no masses, no organomegaly  Genitalia:    Pelvic: cervix normal in appearance, external genitalia normal, no adnexal masses or tenderness, no cervical motion tenderness, rectovaginal septum normal, uterus normal size, shape, and consistency and vagina normal without discharge  Extremities:   Extremities normal, atraumatic, no cyanosis or edema  Pulses:   2+ and symmetric all extremities  Skin:   Skin color, texture, turgor normal, no rashes or lesions  Lymph nodes:   Cervical, supraclavicular, and axillary nodes normal  Neurologic:   CNII-XII intact, normal strength, sensation and reflexes    throughout     Assessment:   B12 deficiency anemia Diagnosed but never treated due to b12 injecaible shortage at time of notification and lost to follow up.,  Discussed treatment today,.  Injection #1 given,  Patient wants to continue home injections and understands to use sublingual tablets when shortages of injecaible occur.   Unspecified vitamin D deficiency Rx for megadose sent to pharmacy.  Patient will resuem daily dose of 2000 units once complete.   ANEMIA, IRON DEFICIENCY NOS No signs of anemia on current CBC but B12 deficiency noted and addressed.   High cholesterol LDL of 140 addressed.  Her only CRFS include FH and she prefers to avoid medication. Mediterranean diet and regular  aerobic exercise recommended.  Repeat annually.   Routine general medical examination at a health care facility Annual comprehensive exam was done including breast, pelvic and PAP smear. All screenings have been addressed .    Updated Medication List Outpatient Encounter Prescriptions as of 11/22/2012  Medication Sig Dispense Refill  . ergocalciferol (DRISDOL) 50000 UNITS capsule Take 1 capsule (50,000 Units total) by mouth once a week.  4 capsule  2  . cyanocobalamin (,VITAMIN B-12,) 1000 MCG/ML injection Inject 1 mL (1,000 mcg total) into the muscle once a week. For 4 weeks,  Then monthly  10 mL  2  . meloxicam (MOBIC) 15 MG tablet Take 1 tablet (15 mg total) by mouth daily.  30 tablet  0  . Syringe, Disposable, 1 ML MISC Use to inject b12 weekly  25 each  1  . zoster vaccine live, PF, (ZOSTAVAX) 16109 UNT/0.65ML injection Inject 19,400 Units into the skin once.  1 each  0  . [EXPIRED] cyanocobalamin ((VITAMIN B-12)) injection 1,000 mcg        No facility-administered encounter medications on file as of 11/22/2012.

## 2012-11-22 NOTE — Patient Instructions (Addendum)
If you would like to try vaginal estrogen,  Let me know

## 2012-11-24 ENCOUNTER — Encounter: Payer: Self-pay | Admitting: Internal Medicine

## 2012-11-24 DIAGNOSIS — Z1239 Encounter for other screening for malignant neoplasm of breast: Secondary | ICD-10-CM | POA: Insufficient documentation

## 2012-11-24 NOTE — Assessment & Plan Note (Signed)
No signs of anemia on current CBC but B12 deficiency noted and addressed.

## 2012-11-24 NOTE — Assessment & Plan Note (Addendum)
LDL of 140 addressed.  Her only CRFS include FH and she prefers to avoid medication. Mediterranean diet and regular aerobic exercise recommended.  Repeat annually.

## 2012-11-24 NOTE — Assessment & Plan Note (Signed)
Diagnosed but never treated due to b12 injecaible shortage at time of notification and lost to follow up.,  Discussed treatment today,.  Injection #1 given,  Patient wants to continue home injections and understands to use sublingual tablets when shortages of injecaible occur.

## 2012-11-24 NOTE — Assessment & Plan Note (Signed)
Rx for megadose sent to pharmacy.  Patient will resuem daily dose of 2000 units once complete.

## 2012-11-24 NOTE — Assessment & Plan Note (Signed)
Annual comprehensive exam was done including breast, pelvic and PAP smear. All screenings have been addressed .  

## 2012-11-26 ENCOUNTER — Encounter: Payer: Self-pay | Admitting: Internal Medicine

## 2012-11-28 NOTE — Telephone Encounter (Signed)
Mailed unread message to patient.  

## 2013-02-22 ENCOUNTER — Other Ambulatory Visit: Payer: Self-pay | Admitting: Internal Medicine

## 2013-02-22 LAB — HM MAMMOGRAPHY: HM Mammogram: NORMAL

## 2013-02-22 NOTE — Telephone Encounter (Signed)
Per Dr. Melina Schools 11/22/12 OV, pt to resume Vitamin D 2000 units daily. Spoke to pt verbalized understanding.

## 2013-03-23 ENCOUNTER — Other Ambulatory Visit: Payer: Self-pay

## 2013-05-22 ENCOUNTER — Telehealth: Payer: Self-pay | Admitting: *Deleted

## 2013-05-22 DIAGNOSIS — Z79899 Other long term (current) drug therapy: Secondary | ICD-10-CM

## 2013-05-22 DIAGNOSIS — E785 Hyperlipidemia, unspecified: Secondary | ICD-10-CM

## 2013-05-22 DIAGNOSIS — E559 Vitamin D deficiency, unspecified: Secondary | ICD-10-CM

## 2013-05-22 NOTE — Telephone Encounter (Signed)
Pt is coming in for labs tomorrow 01.06.2014 what labs and dx?

## 2013-05-23 ENCOUNTER — Other Ambulatory Visit (INDEPENDENT_AMBULATORY_CARE_PROVIDER_SITE_OTHER): Payer: BC Managed Care – PPO

## 2013-05-23 DIAGNOSIS — Z79899 Other long term (current) drug therapy: Secondary | ICD-10-CM

## 2013-05-23 DIAGNOSIS — E785 Hyperlipidemia, unspecified: Secondary | ICD-10-CM

## 2013-05-23 DIAGNOSIS — E559 Vitamin D deficiency, unspecified: Secondary | ICD-10-CM

## 2013-05-23 LAB — COMPREHENSIVE METABOLIC PANEL
ALT: 18 U/L (ref 0–35)
AST: 17 U/L (ref 0–37)
Albumin: 4 g/dL (ref 3.5–5.2)
Alkaline Phosphatase: 70 U/L (ref 39–117)
BUN: 16 mg/dL (ref 6–23)
CO2: 28 mEq/L (ref 19–32)
Calcium: 8.9 mg/dL (ref 8.4–10.5)
Chloride: 106 mEq/L (ref 96–112)
Creatinine, Ser: 0.9 mg/dL (ref 0.4–1.2)
GFR: 64.97 mL/min (ref >60.00)
Glucose, Bld: 105 mg/dL — ABNORMAL HIGH (ref 70–99)
Potassium: 4.2 mEq/L (ref 3.5–5.1)
Sodium: 142 mEq/L (ref 135–145)
Total Bilirubin: 0.7 mg/dL (ref 0.3–1.2)
Total Protein: 7 g/dL (ref 6.0–8.3)

## 2013-05-23 LAB — LIPID PANEL
Cholesterol: 232 mg/dL — ABNORMAL HIGH (ref 0–200)
HDL: 35.2 mg/dL — ABNORMAL LOW (ref >39.00)
Total CHOL/HDL Ratio: 7
Triglycerides: 199 mg/dL — ABNORMAL HIGH (ref 0.0–149.0)
VLDL: 39.8 mg/dL (ref 0.0–40.0)

## 2013-05-23 LAB — LDL CHOLESTEROL, DIRECT: Direct LDL: 171.3 mg/dL

## 2013-05-24 LAB — VITAMIN D 25 HYDROXY (VIT D DEFICIENCY, FRACTURES): Vit D, 25-Hydroxy: 23 ng/mL — ABNORMAL LOW (ref 30–89)

## 2013-05-26 ENCOUNTER — Ambulatory Visit (INDEPENDENT_AMBULATORY_CARE_PROVIDER_SITE_OTHER): Payer: BC Managed Care – PPO | Admitting: Internal Medicine

## 2013-05-26 ENCOUNTER — Encounter: Payer: Self-pay | Admitting: Internal Medicine

## 2013-05-26 VITALS — BP 134/78 | HR 76 | Temp 98.2°F | Resp 14 | Wt 223.5 lb

## 2013-05-26 DIAGNOSIS — E559 Vitamin D deficiency, unspecified: Secondary | ICD-10-CM

## 2013-05-26 DIAGNOSIS — D509 Iron deficiency anemia, unspecified: Secondary | ICD-10-CM

## 2013-05-26 DIAGNOSIS — D519 Vitamin B12 deficiency anemia, unspecified: Secondary | ICD-10-CM

## 2013-05-26 DIAGNOSIS — D518 Other vitamin B12 deficiency anemias: Secondary | ICD-10-CM

## 2013-05-26 DIAGNOSIS — E669 Obesity, unspecified: Secondary | ICD-10-CM

## 2013-05-26 DIAGNOSIS — E7800 Pure hypercholesterolemia, unspecified: Secondary | ICD-10-CM

## 2013-05-26 MED ORDER — CYANOCOBALAMIN 1000 MCG/ML IJ SOLN: 1000.0000 ug | INTRAMUSCULAR | Status: DC

## 2013-05-26 MED ORDER — ERGOCALCIFEROL 1.25 MG (50000 UT) PO CAPS: 50000.0000 [IU] | ORAL_CAPSULE | ORAL | Status: DC

## 2013-05-26 NOTE — Patient Instructions (Signed)
Your BMI is currently 34.  I want you to get your weight down to 190 lbs ( to lower your BMI to < 30) .  Your vitamin D is low again, so resume the megadose weekly for the winter months,  Then switch to 1000 units daily and 600 mg calcium daily   This is my version of a  "Low GI"  Diet:  It will still lower your blood sugars and allow you to lose 4 to 8  lbs  per month if you follow it carefully.  Your goal with exercise is a minimum of 30 minutes of aerobic exercise 5 days per week (Walking does not count once it becomes easy!)    All of the foods can be found at grocery stores and in bulk at [employer].  The Atkins protein bars and shakes are available in more varieties at [employer], [employer] and [place].     7 AM Breakfast:  Choose from the following:  Low carbohydrate Protein  Shakes (I recommend the EAS AdvantEdge "Carb Control" shakes  Or the low carb shakes by Atkins.    2.5 carbs   Arnold's "Sandwhich Thin"toasted  w/ peanut butter (no jelly: about 20 net carbs  "Bagel Thin" with cream cheese and salmon: about 20 carbs   a scrambled egg/bacon/cheese burrito made with Mission's "carb balance" whole wheat tortilla  (about 10 net carbs )   Avoid cereal and bananas, oatmeal and cream of wheat and grits. They are loaded with carbohydrates!   10 AM: high protein snack  Protein bar by Atkins (the snack size, under 200 cal, usually < 6 net carbs).    A stick of cheese:  Around 1 carb,  100 cal     Dannon Light n Fit [place] Yogurt  (80 cal, 8 carbs)  Other so called "protein bars" and Greek yogurts tend to be loaded with carbohydrates.  Remember, in food advertising, the word "energy" is synonymous for " carbohydrate."  Lunch:   A Sandwich using the bread choices listed, Can use any  Eggs,  lunchmeat, grilled meat or canned tuna), avocado, regular mayo/mustard  and cheese.  A Salad using blue cheese, ranch,  Goddess or vinagrette,  No croutons or "confetti" and no "candied nuts" but regular  nuts OK.   No pretzels or chips.  Pickles and miniature sweet peppers are a good low carb alternative that provide a "crunch"  The bread is the only source of carbohydrate in a sandwich and  can be decreased by trying some of these alternatives to traditional loaf bread  Joseph's makes a pita bread and a flat bread that are 50 cal and 4 net carbs available at [place] and [place].  This can be toasted to use with hummous as well  Toufayan makes a low carb flatbread that's 100 cal and 9 net carbs available at [employer] and [employer]'s makes 2 sizes of  Low carb whole wheat tortilla  (The large one is 210 cal and 6 net carbs) Avoid "Low fat dressings, as well as Barry Brunner and [place] dressings They are loaded with sugar!   3 PM/ Mid day  Snack:  Consider  1 ounce of  almonds, walnuts, pistachios, pecans, peanuts,  Macadamia nuts or a nut medley.  Avoid "granola"; the dried cranberries and raisins are loaded with carbohydrates. Mixed nuts as long as there are no raisins,  cranberries or dried fruit.     6 PM  Dinner:  Meat/fowl/fish with a green salad, and either broccoli, cauliflower, green beans, spinach, brussel sprouts or  Lima beans. DO NOT BREAD THE PROTEIN!!      There is a low carb pasta by Dreamfield's that is acceptable and tastes great: only 5 digestible carbs/serving.( All grocery stores but BJs carry it )  Try Hurley Cisco Angelo's chicken piccata or chicken or eggplant parm over low carb pasta.(Lowes and BJs)   Marjory Lies Sanchez's "Carnitas" (pulled pork, no sauce,  0 carbs) or his beef pot roast to make a dinner burrito (at [employer]J's)  Pesto over low carb pasta (bj's sells a good quality pesto in the center refrigerated section of the deli   Whole wheat pasta is still full of digestible carbs and  Not as low in glycemic index as Dreamfield's.   Brown rice is still rice,  So skip the rice and noodles if you eat [place] or [place] (or at least limit to 1/2 cup)  9 PM snack :   Breyer's "low  carb" fudgsicle or  ice cream bar (Carb Smart line), or  Weight Watcher's ice cream bar , or another "no sugar added" ice cream;  a serving of fresh berries/cherries with whipped cream   Cheese or DANNON'S LlGHT N [place] YOGURT  Avoid bananas, pineapple, grapes  and watermelon on a regular basis because they are high in sugar.  THINK OF THEM AS DESSERT  Remember that snack Substitutions should be less than 10 NET carbs per serving and meals < 20 carbs. Remember to subtract fiber grams to get the "net carbs."

## 2013-05-27 DIAGNOSIS — E669 Obesity, unspecified: Secondary | ICD-10-CM | POA: Insufficient documentation

## 2013-05-27 NOTE — Progress Notes (Signed)
Patient ID: Jo White, female   DOB: [birth year], 63 y.o.   MRN: [id number]  Patient Active Problem List   Diagnosis Date Noted  . Obesity, unspecified 05/27/2013  . Routine general medical examination at a health care facility 11/24/2012  . B12 deficiency anemia 11/16/2012  . Unspecified vitamin D deficiency 11/16/2012  . Lipoma of neck 01/12/2012  . Family history of MI (myocardial infarction) 05/08/2011  . GERD (gastroesophageal reflux disease) 05/08/2011  . High cholesterol 09/04/2010  . Osteopenia 09/04/2010  . HEMORRHOIDS, INTERNAL, WITH BLEEDING 10/15/2006  . EXTERNAL HEMORRHOIDS 10/15/2006  . HEART MURMUR, HX OF 10/15/2006    Subjective:  CC:   Chief Complaint  Patient presents with  . Follow-up    B 12    HPI:   Jo White a 63 y.o. female who presents for follow up on chronic conditions including GERD, b12 and  iron deficiency anemia, and hyperlipidemia.  She feels generally well, is  Exercising regularly and slef administering monthly b12 injections without problems.    Past Medical History  Diagnosis Date  . Iron deficiency anemia, unspecified   . Internal hemorrhoids with other complication   . Otalgia, unspecified   . Swelling, mass, or lump in head and neck   . Blood in stool   . External hemorrhoids without mention of complication   . Personal history of unspecified circulatory disease     Past Surgical History  Procedure Laterality Date  . Fallopian tube removal      r tube  . Appendectomy    . In vitro fertilization  1985  . Breast biopsy      right  . Hemorrhoid surgery  11/2006       The following portions of the patient's history were reviewed and updated as appropriate: Allergies, current medications, and problem list.    Review of Systems:   Patient denies headache, fevers, malaise, unintentional weight loss, skin rash, eye pain, sinus congestion and sinus pain, sore throat, dysphagia,  hemoptysis , cough, dyspnea, wheezing,  chest pain, palpitations, orthopnea, edema, abdominal pain, nausea, melena, diarrhea, constipation, flank pain, dysuria, hematuria, urinary  Frequency, nocturia, numbness, tingling, seizures,  Focal weakness, Loss of consciousness,  Tremor, insomnia, depression, anxiety, and suicidal ideation.      History   Social History  . Marital Status: Married    Spouse Name: N/A    Number of Children: 2  . Years of Education: N/A   Occupational History  . [employer]    Social History Main Topics  . Smoking status: Former Smoker    Types: Cigarettes    Quit date: 01/11/1972  . Smokeless tobacco: Never Used     Comment: 1 1/2 years as a teen  . Alcohol Use: Yes     Comment: rare alcohol intake  . Drug Use: No  . Sexual Activity: Not on file   Other Topics Concern  . Not on file   Social History Narrative   No regular exercise, some walking   Diet- some fruit and veggies, avoid fast food    Objective:  Filed Vitals:   05/26/13 0801  BP: 134/78  Pulse: 76  Temp: 98.2 F (36.8 C)  Resp: 14     General appearance: alert, cooperative and appears stated age Ears: normal TM's and external ear canals both ears Throat: lips, mucosa, and tongue normal; teeth and gums normal Neck: no adenopathy, no carotid bruit, supple, symmetrical, trachea midline and thyroid not enlarged, symmetric, no tenderness/mass/nodules Back:  symmetric, no curvature. ROM normal. No CVA tenderness. Lungs: clear to auscultation bilaterally Heart: regular rate and rhythm, S1, S2 normal, no murmur, click, rub or gallop Abdomen: soft, non-tender; bowel sounds normal; no masses,  no organomegaly Pulses: 2+ and symmetric Skin: Skin color, texture, turgor normal. No rashes or lesions Lymph nodes: Cervical, supraclavicular, and axillary nodes normal.  Assessment and Plan:  Obesity, unspecified She has gained 35 lb in 4 years.  I have addressed  BMI and recommended a low glycemic index diet utilizing smaller more  frequent meals to increase metabolism.  I have also recommended that patient start exercising with a goal of 30 minutes of aerobic exercise a minimum of 5 days per week. Screening for lipid disorders, thyroid and diabetes to be done today.    High cholesterol 10 yr risk is 12.6 % based on current lipid profile. She prefers to avoid statins.  Wt loss and Meditereranean low GI diet recommended.    Lab Results  Component Value Date   CHOL 232* 05/23/2013   HDL 35.20* 05/23/2013   LDLCALC 140* 08/03/2008   LDLDIRECT 171.3 05/23/2013   TRIG 199.0* 05/23/2013   CHOLHDL 7 05/23/2013    B12 deficiency anemia She has been taking monthly B12 injections .   ANEMIA, IRON DEFICIENCY NOS Resolved by CBC July 2014  Lab Results  Component Value Date   WBC 4.6 11/15/2012   HGB 12.6 11/15/2012   HCT 37.0 11/15/2012   MCV 87.9 11/15/2012   PLT 203.0 11/15/2012     Unspecified vitamin D deficiency Recurrent.  Drisdol x 3 months,  Then resume 1000 units daily    A total of 25 minutes of face to face time was spent with patient more than half of which was spent in counselling and coordination of care    Updated Medication List Outpatient Encounter Prescriptions as of 05/26/2013  Medication Sig  . cyanocobalamin (,VITAMIN B-12,) 1000 MCG/ML injection Inject 1 mL (1,000 mcg total) into the muscle every 30 (thirty) days. For 4 weeks,  Then monthly  . Syringe, Disposable, 1 ML MISC Use to inject b12 weekly  . [DISCONTINUED] cyanocobalamin (,VITAMIN B-12,) 1000 MCG/ML injection Inject 1 mL (1,000 mcg total) into the muscle once a week. For 4 weeks,  Then monthly  . ergocalciferol (DRISDOL) 50000 UNITS capsule Take 1 capsule (50,000 Units total) by mouth once a week.  . zoster vaccine live, PF, (ZOSTAVAX) 85885 UNT/0.65ML injection Inject 19,400 Units into the skin once.  . [DISCONTINUED] ergocalciferol (DRISDOL) 50000 UNITS capsule Take 1 capsule (50,000 Units total) by mouth once a week.     No orders of the  defined types were placed in this encounter.    No Follow-up on file.

## 2013-05-27 NOTE — Assessment & Plan Note (Signed)
She has been taking monthly B12 injections .

## 2013-05-27 NOTE — Assessment & Plan Note (Signed)
Recurrent.  Drisdol x 3 months,  Then resume 1000 units daily

## 2013-05-27 NOTE — Assessment & Plan Note (Addendum)
10 yr risk is 12.6 % based on current lipid profile. She prefers to avoid statins.  Wt loss and Meditereranean low GI diet recommended.    Lab Results  Component Value Date   CHOL 232* 05/23/2013   HDL 35.20* 05/23/2013   LDLCALC 140* 08/03/2008   LDLDIRECT 171.3 05/23/2013   TRIG 199.0* 05/23/2013   CHOLHDL 7 05/23/2013

## 2013-05-27 NOTE — Assessment & Plan Note (Signed)
She has gained 35 lb in 4 years.  I have addressed  BMI and recommended a low glycemic index diet utilizing smaller more frequent meals to increase metabolism.  I have also recommended that patient start exercising with a goal of 30 minutes of aerobic exercise a minimum of 5 days per week. Screening for lipid disorders, thyroid and diabetes to be done today.

## 2013-05-27 NOTE — Assessment & Plan Note (Signed)
Resolved by CBC July 2014  Lab Results  Component Value Date   WBC 4.6 11/15/2012   HGB 12.6 11/15/2012   HCT 37.0 11/15/2012   MCV 87.9 11/15/2012   PLT 203.0 11/15/2012

## 2013-11-03 ENCOUNTER — Other Ambulatory Visit: Payer: Self-pay | Admitting: Internal Medicine

## 2013-11-27 ENCOUNTER — Other Ambulatory Visit: Payer: Self-pay | Admitting: Internal Medicine

## 2014-03-21 ENCOUNTER — Other Ambulatory Visit: Payer: Self-pay | Admitting: *Deleted

## 2014-03-21 MED ORDER — CYANOCOBALAMIN 1000 MCG/ML IJ SOLN: INTRAMUSCULAR | Status: DC

## 2014-07-30 ENCOUNTER — Encounter: Payer: Self-pay | Admitting: Internal Medicine

## 2014-08-13 ENCOUNTER — Other Ambulatory Visit (INDEPENDENT_AMBULATORY_CARE_PROVIDER_SITE_OTHER): Payer: BC Managed Care – PPO

## 2014-08-13 ENCOUNTER — Telehealth: Payer: Self-pay | Admitting: *Deleted

## 2014-08-13 DIAGNOSIS — E538 Deficiency of other specified B group vitamins: Secondary | ICD-10-CM

## 2014-08-13 DIAGNOSIS — D500 Iron deficiency anemia secondary to blood loss (chronic): Secondary | ICD-10-CM | POA: Diagnosis not present

## 2014-08-13 DIAGNOSIS — E785 Hyperlipidemia, unspecified: Secondary | ICD-10-CM

## 2014-08-13 DIAGNOSIS — E559 Vitamin D deficiency, unspecified: Secondary | ICD-10-CM

## 2014-08-13 DIAGNOSIS — R5383 Other fatigue: Secondary | ICD-10-CM | POA: Diagnosis not present

## 2014-08-13 LAB — LIPID PANEL
Cholesterol: 192 mg/dL (ref 0–200)
HDL: 38.8 mg/dL — ABNORMAL LOW (ref >39.00)
LDL Cholesterol: 120 mg/dL — ABNORMAL HIGH (ref 0–99)
NonHDL: 153.2
Total CHOL/HDL Ratio: 5
Triglycerides: 164 mg/dL — ABNORMAL HIGH (ref 0.0–149.0)
VLDL: 32.8 mg/dL (ref 0.0–40.0)

## 2014-08-13 LAB — CBC WITH DIFFERENTIAL/PLATELET
Basophils Absolute: 0 10*3/uL (ref 0.0–0.1)
Basophils Relative: 0.3 % (ref 0.0–3.0)
Eosinophils Absolute: 0.2 10*3/uL (ref 0.0–0.7)
Eosinophils Relative: 4 % (ref 0.0–5.0)
HCT: 37.9 % (ref 36.0–46.0)
Hemoglobin: 12.8 g/dL (ref 12.0–15.0)
Lymphocytes Relative: 37.6 % (ref 12.0–46.0)
Lymphs Abs: 1.6 10*3/uL (ref 0.7–4.0)
MCHC: 33.7 g/dL (ref 30.0–36.0)
MCV: 84.9 fl (ref 78.0–100.0)
Monocytes Absolute: 0.4 10*3/uL (ref 0.1–1.0)
Monocytes Relative: 9.6 % (ref 3.0–12.0)
Neutro Abs: 2 10*3/uL (ref 1.4–7.7)
Neutrophils Relative %: 48.5 % (ref 43.0–77.0)
Platelets: 205 10*3/uL (ref 150.0–400.0)
RBC: 4.47 Mil/uL (ref 3.87–5.11)
RDW: 13.6 % (ref 11.5–15.5)
WBC: 4.1 10*3/uL (ref 4.0–10.5)

## 2014-08-13 LAB — VITAMIN D 25 HYDROXY (VIT D DEFICIENCY, FRACTURES): VITD: 11.35 ng/mL — ABNORMAL LOW (ref 30.00–100.00)

## 2014-08-13 LAB — IBC PANEL
Iron: 62 ug/dL (ref 42–145)
Saturation Ratios: 17 % — ABNORMAL LOW (ref 20.0–50.0)
Transferrin: 260 mg/dL (ref 212.0–360.0)

## 2014-08-13 LAB — FERRITIN: Ferritin: 28 ng/mL (ref 10.0–291.0)

## 2014-08-13 LAB — VITAMIN B12: Vitamin B-12: 281 pg/mL (ref 211–911)

## 2014-08-13 LAB — TSH: TSH: 2.55 u[IU]/mL (ref 0.35–4.50)

## 2014-08-13 NOTE — Telephone Encounter (Signed)
Labs and dx?  

## 2014-08-17 ENCOUNTER — Encounter: Payer: Self-pay | Admitting: Internal Medicine

## 2014-08-17 ENCOUNTER — Ambulatory Visit (INDEPENDENT_AMBULATORY_CARE_PROVIDER_SITE_OTHER): Payer: BLUE CROSS/BLUE SHIELD | Admitting: Internal Medicine

## 2014-08-17 VITALS — BP 130/78 | HR 69 | Temp 97.5°F | Resp 16 | Ht 66.5 in | Wt 225.5 lb

## 2014-08-17 DIAGNOSIS — M79673 Pain in unspecified foot: Secondary | ICD-10-CM

## 2014-08-17 DIAGNOSIS — M25512 Pain in left shoulder: Secondary | ICD-10-CM | POA: Diagnosis not present

## 2014-08-17 DIAGNOSIS — E559 Vitamin D deficiency, unspecified: Secondary | ICD-10-CM

## 2014-08-17 DIAGNOSIS — Z1231 Encounter for screening mammogram for malignant neoplasm of breast: Secondary | ICD-10-CM

## 2014-08-17 DIAGNOSIS — Z1239 Encounter for other screening for malignant neoplasm of breast: Secondary | ICD-10-CM

## 2014-08-17 DIAGNOSIS — G8929 Other chronic pain: Secondary | ICD-10-CM

## 2014-08-17 DIAGNOSIS — Z0000 Encounter for general adult medical examination without abnormal findings: Secondary | ICD-10-CM

## 2014-08-17 DIAGNOSIS — M8580 Other specified disorders of bone density and structure, unspecified site: Secondary | ICD-10-CM | POA: Diagnosis not present

## 2014-08-17 DIAGNOSIS — D519 Vitamin B12 deficiency anemia, unspecified: Secondary | ICD-10-CM

## 2014-08-17 DIAGNOSIS — E669 Obesity, unspecified: Secondary | ICD-10-CM

## 2014-08-17 MED ORDER — ERGOCALCIFEROL 1.25 MG (50000 UT) PO CAPS: 50000.0000 [IU] | ORAL_CAPSULE | ORAL | Status: DC

## 2014-08-17 NOTE — Progress Notes (Signed)
Patient ID: Jo White, female   DOB: May 28, 1950, 64 y.o.   MRN: [id number]  Subjective:     Jo White is a 64 y.o. female and is here for a comprehensive physical exam. The patient reports as follows:.  Weight gain  Inability to lose weight.  Past exercise effirts have been hindered byu orthopedic complaints.   Had a steroid injection in left shoulder,  No help.  Tried meloxicam ,  No help .  Can't abduct above shoulder   Wants to see Charlann Boxer    Bilateral heel pain worse by end of day  Has tried dieting with limited success .  [facility]    [facility]   Social History  . Marital Status: Married    Spouse Name: N/A  . Number of Children: 2  . Years of Education: N/A   Occupational History  . [employer]    Social History Main Topics  . Smoking status: Former Smoker    Types: Cigarettes    Quit date: 01/11/1972  . Smokeless tobacco: Never Used     Comment: 1 1/2 years as a teen  . Alcohol Use: Yes     Comment: rare alcohol intake  . Drug Use: No  . Sexual Activity: Not on file   Other Topics Concern  . Not on file   Social History Narrative   No regular exercise, some walking   Diet- some fruit and veggies, avoid fast food   Health Maintenance  Topic Date Due  . HIV Screening  08/03/1966  . ZOSTAVAX  08/03/2011  . MAMMOGRAM  02/22/2014  . INFLUENZA VACCINE  12/17/2014  . PAP SMEAR  11/23/2015  . COLONOSCOPY  01/11/2016  . TETANUS/TDAP  11/22/2021    The following portions of the patient's history were reviewed and updated as appropriate: allergies, current medications, past family history, past medical history, past social history, past surgical history and problem list.   Review of Systems  Patient denies headache, fevers, malaise, unintentional weight loss, skin rash, eye pain, sinus congestion and sinus pain, sore throat, dysphagia,  hemoptysis , cough, dyspnea, wheezing, chest pain, palpitations, orthopnea, edema, abdominal  pain, nausea, melena, diarrhea, constipation, flank pain, dysuria, hematuria, urinary  Frequency, nocturia, numbness, tingling, seizures,  Focal weakness, Loss of consciousness,  Tremor, insomnia, depression, anxiety, and suicidal ideation.      Objective:    BP 130/78 mmHg  Pulse 69  Temp(Src) 97.5 F (36.4 C) (Oral)  Resp 16  Ht 5' 6.5" (1.689 m)  Wt 225 lb 8 oz (102.286 kg)  BMI 35.86 kg/m2  SpO2 97%  General appearance: alert, cooperative and appears stated age Head: Normocephalic, without obvious abnormality, atraumatic Eyes: conjunctivae/corneas clear. PERRL, EOM's intact. Fundi benign. Ears: normal TM's and external ear canals both ears Nose: Nares normal. Septum midline. Mucosa normal. No drainage or sinus tenderness. Throat: lips, mucosa, and tongue normal; teeth and gums normal Neck: no adenopathy, no carotid bruit, no JVD, supple, symmetrical, trachea midline and thyroid not enlarged, symmetric, no tenderness/mass/nodules Lungs: clear to auscultation bilaterally Breasts: normal appearance, no masses or tenderness Heart: regular rate and rhythm, S1, S2 normal, no murmur, click, rub or gallop Abdomen: soft, non-tender; bowel sounds normal; no masses,  no organomegaly Extremities: extremities normal, atraumatic, no cyanosis or edema Pulses: 2+ and symmetric Skin: Skin color, texture, turgor normal. No rashes or lesions Neurologic: Alert and oriented X 3, normal strength and tone. Normal symmetric reflexes.  Normal coordination and gait.  MSK:  Left shoulder limited to active ROM to 180 degerees, passive to 210 ,  Internal rotation limited due to pain    Assessment and Plan:    Problem List Items Addressed This Visit      Unprioritized   Osteopenia    With Vit D deficiency,    I recommend to ontinue calcium 1200 to 1800 mg daily through diet and supplements ,and weight bearing exercise on a regular basis. .Discussed the current controversies surrounding the risks and  benefits of calcium supplementation.  Encouraged her to increase dietary calcium through natural foods including almond/coconut milk      B12 deficiency anemia    Patient has not had supplementation in close to 6 months and advised to resume monthly or sublingual .  Lab Results  Component Value Date   VITAMINB12 281 08/13/2014         Relevant Medications   cyanocobalamin (,VITAMIN B-12,) 1000 MCG/ML injection   Vitamin D deficiency    Recurrent,  Rx sent       Routine general medical examination at a health care facility    Annual wellness  exam was done as well as a comprehensive physical exam and management of acute and chronic conditions .  During the course of the visit the patient was educated and counseled about appropriate screening and preventive services including :  diabetes screening, lipid analysis with projected  10 year  risk for CAD , nutrition counseling, colorectal cancer screening, and recommended immunizations.  Printed recommendations for health maintenance screenings was given.       Obesity    I have addressed  BMI and recommended wt loss of 10% of body weigh over the next 6 months using a low glycemic index diet and regular exercise a minimum of 5 days per week.   Lab Results  Component Value Date   TSH 2.55 08/13/2014   No results found for: HGBA1C Lab Results  Component Value Date   CHOL 192 08/13/2014   HDL 38.80* 08/13/2014   LDLCALC 120* 08/13/2014   LDLDIRECT 171.3 05/23/2013   TRIG 164.0* 08/13/2014   CHOLHDL 5 08/13/2014          Pain in joint, shoulder region    No relief with prior steroid injection by Orthopedics,  Discussed potential diagnoses of rotator cuff syndrome vs adhesive capsulitis,  And referral to sports medicine.       Relevant Orders   Ambulatory referral to Sports Medicine   Chronic heel pain    Unclear if she has a bone spur vs early plantar fasciitis.  She will discuss with Dr Tamala Julian        Other Visit Diagnoses     Breast cancer screening, high risk patient    -  Primary    Relevant Orders    MM DIGITAL SCREENING BILATERAL

## 2014-08-17 NOTE — Patient Instructions (Addendum)
Resume your monthly b12 injections  Your vitamin D is very low,  But your cholesterol has improved..   I am calling in a megadose of Vit D to take once weekly for a total of 3 months,  Then after you finish the weekly supplement, you should start taking an OTC  Vit D3 supplement 1000 units daily.    I recommend getting the majority of your calcium and Vitamin D  through diet rather than supplements given the recent association of calcium supplements with increased coronary artery calcium scores  Try the Silk almond Light,  Original formula.  Delicious,  Low carb,  Low cal,  Cholesterol free  Health Maintenance Adopting a healthy lifestyle and getting preventive care can go a long way to promote health and wellness. Talk with your health care provider about what schedule of regular examinations is right for you. This is a good chance for you to check in with your provider about disease prevention and staying healthy. In between checkups, there are plenty of things you can do on your own. Experts have done a lot of research about which lifestyle changes and preventive measures are most likely to keep you healthy. Ask your health care provider for more information. WEIGHT AND DIET  Eat a healthy diet  Be sure to include plenty of vegetables, fruits, low-fat dairy products, and lean protein.  Do not eat a lot of foods high in solid fats, added sugars, or salt.  Get regular exercise. This is one of the most important things you can do for your health.  Most adults should exercise for at least 150 minutes each week. The exercise should increase your heart rate and make you sweat (moderate-intensity exercise).  Most adults should also do strengthening exercises at least twice a week. This is in addition to the moderate-intensity exercise.  Maintain a healthy weight  Body mass index (BMI) is a measurement that can be used to identify possible weight problems. It estimates body fat based on height  and weight. Your health care provider can help determine your BMI and help you achieve or maintain a healthy weight.  For females 38 years of age and older:   A BMI below 18.5 is considered underweight.  A BMI of 18.5 to 24.9 is normal.  A BMI of 25 to 29.9 is considered overweight.  A BMI of 30 and above is considered obese.  Watch levels of cholesterol and blood lipids  You should start having your blood tested for lipids and cholesterol at 63 years of age, then have this test every 5 years.  You may need to have your cholesterol levels checked more often if:  Your lipid or cholesterol levels are high.  You are older than 63 years of age.  You are at high risk for heart disease.  CANCER SCREENING   Lung Cancer  Lung cancer screening is recommended for adults 17-69 years old who are at high risk for lung cancer because of a history of smoking.  A yearly low-dose CT scan of the lungs is recommended for people who:  Currently smoke.  Have quit within the past 15 years.  Have at least a 30-pack-year history of smoking. A pack year is smoking an average of one pack of cigarettes a day for 1 year.  Yearly screening should continue until it has been 15 years since you quit.  Yearly screening should stop if you develop a health problem that would prevent you from having lung cancer treatment.  Breast Cancer  Practice breast self-awareness. This means understanding how your breasts normally appear and feel.  It also means doing regular breast self-exams. Let your health care provider know about any changes, no matter how small.  If you are in your 20s or 30s, you should have a clinical breast exam (CBE) by a health care provider every 1-3 years as part of a regular health exam.  If you are 33 or older, have a CBE every year. Also consider having a breast X-ray (mammogram) every year.  If you have a family history of breast cancer, talk to your health care provider about  genetic screening.  If you are at high risk for breast cancer, talk to your health care provider about having an MRI and a mammogram every year.  Breast cancer gene (BRCA) assessment is recommended for women who have family members with BRCA-related cancers. BRCA-related cancers include:  Breast.  Ovarian.  Tubal.  Peritoneal cancers.  Results of the assessment will determine the need for genetic counseling and BRCA1 and BRCA2 testing. Cervical Cancer Routine pelvic examinations to screen for cervical cancer are no longer recommended for nonpregnant women who are considered low risk for cancer of the pelvic organs (ovaries, uterus, and vagina) and who do not have symptoms. A pelvic examination may be necessary if you have symptoms including those associated with pelvic infections. Ask your health care provider if a screening pelvic exam is right for you.   The Pap test is the screening test for cervical cancer for women who are considered at risk.  If you had a hysterectomy for a problem that was not cancer or a condition that could lead to cancer, then you no longer need Pap tests.  If you are older than 65 years, and you have had normal Pap tests for the past 10 years, you no longer need to have Pap tests.  If you have had past treatment for cervical cancer or a condition that could lead to cancer, you need Pap tests and screening for cancer for at least 20 years after your treatment.  If you no longer get a Pap test, assess your risk factors if they change (such as having a new sexual partner). This can affect whether you should start being screened again.  Some women have medical problems that increase their chance of getting cervical cancer. If this is the case for you, your health care provider may recommend more frequent screening and Pap tests.  The human papillomavirus (HPV) test is another test that may be used for cervical cancer screening. The HPV test looks for the virus  that can cause cell changes in the cervix. The cells collected during the Pap test can be tested for HPV.  The HPV test can be used to screen women 58 years of age and older. Getting tested for HPV can extend the interval between normal Pap tests from three to five years.  An HPV test also should be used to screen women of any age who have unclear Pap test results.  After 63 years of age, women should have HPV testing as often as Pap tests.  Colorectal Cancer  This type of cancer can be detected and often prevented.  Routine colorectal cancer screening usually begins at 63 years of age and continues through 63 years of age.  Your health care provider may recommend screening at an earlier age if you have risk factors for colon cancer.  Your health care provider may also recommend using  home test kits to check for hidden blood in the stool.  A small camera at the end of a tube can be used to examine your colon directly (sigmoidoscopy or colonoscopy). This is done to check for the earliest forms of colorectal cancer.  Routine screening usually begins at age 56.  Direct examination of the colon should be repeated every 5-10 years through 63 years of age. However, you may need to be screened more often if early forms of precancerous polyps or small growths are found. Skin Cancer  Check your skin from head to toe regularly.  Tell your health care provider about any new moles or changes in moles, especially if there is a change in a mole's shape or color.  Also tell your health care provider if you have a mole that is larger than the size of a pencil eraser.  Always use sunscreen. Apply sunscreen liberally and repeatedly throughout the day.  Protect yourself by wearing long sleeves, pants, a wide-brimmed hat, and sunglasses whenever you are outside. HEART DISEASE, DIABETES, AND HIGH BLOOD PRESSURE   Have your blood pressure checked at least every 1-2 years. High blood pressure causes  heart disease and increases the risk of stroke.  If you are between 4 years and 10 years old, ask your health care provider if you should take aspirin to prevent strokes.  Have regular diabetes screenings. This involves taking a blood sample to check your fasting blood sugar level.  If you are at a normal weight and have a low risk for diabetes, have this test once every three years after 63 years of age.  If you are overweight and have a high risk for diabetes, consider being tested at a younger age or more often. PREVENTING INFECTION  Hepatitis B  If you have a higher risk for hepatitis B, you should be screened for this virus. You are considered at high risk for hepatitis B if:  You were born in a country where hepatitis B is common. Ask your health care provider which countries are considered high risk.  Your parents were born in a high-risk country, and you have not been immunized against hepatitis B (hepatitis B vaccine).  You have HIV or AIDS.  You use needles to inject street drugs.  You live with someone who has hepatitis B.  You have had sex with someone who has hepatitis B.  You get hemodialysis treatment.  You take certain medicines for conditions, including cancer, organ transplantation, and autoimmune conditions. Hepatitis C  Blood testing is recommended for:  Everyone born from [birth year] through 1964.  Anyone with known risk factors for hepatitis C. Sexually transmitted infections (STIs)  You should be screened for sexually transmitted infections (STIs) including gonorrhea and chlamydia if:  You are sexually active and are younger than 63 years of age.  You are older than 63 years of age and your health care provider tells you that you are at risk for this type of infection.  Your sexual activity has changed since you were last screened and you are at an increased risk for chlamydia or gonorrhea. Ask your health care provider if you are at risk.  If you do not  have HIV, but are at risk, it may be recommended that you take a prescription medicine daily to prevent HIV infection. This is called pre-exposure prophylaxis (PrEP). You are considered at risk if:  You are sexually active and do not regularly use condoms or know the HIV status of your  partner(s).  You take drugs by injection.  You are sexually active with a partner who has HIV. Talk with your health care provider about whether you are at high risk of being infected with HIV. If you choose to begin PrEP, you should first be tested for HIV. You should then be tested every 3 months for as long as you are taking PrEP.  PREGNANCY   If you are premenopausal and you may become pregnant, ask your health care provider about preconception counseling.  If you may become pregnant, take 400 to 800 micrograms (mcg) of folic acid every day.  If you want to prevent pregnancy, talk to your health care provider about birth control (contraception). OSTEOPOROSIS AND MENOPAUSE   Osteoporosis is a disease in which the bones lose minerals and strength with aging. This can result in serious bone fractures. Your risk for osteoporosis can be identified using a bone density scan.  If you are 105 years of age or older, or if you are at risk for osteoporosis and fractures, ask your health care provider if you should be screened.  Ask your health care provider whether you should take a calcium or vitamin D supplement to lower your risk for osteoporosis.  Menopause may have certain physical symptoms and risks.  Hormone replacement therapy may reduce some of these symptoms and risks. Talk to your health care provider about whether hormone replacement therapy is right for you.  HOME CARE INSTRUCTIONS   Schedule regular health, dental, and eye exams.  Stay current with your immunizations.   Do not use any tobacco products including cigarettes, chewing tobacco, or electronic cigarettes.  If you are pregnant, do not  drink alcohol.  If you are breastfeeding, limit how much and how often you drink alcohol.  Limit alcohol intake to no more than 1 drink per day for nonpregnant women. One drink equals 12 ounces of beer, 5 ounces of wine, or 1 ounces of hard liquor.  Do not use street drugs.  Do not share needles.  Ask your health care provider for help if you need support or information about quitting drugs.  Tell your health care provider if you often feel depressed.  Tell your health care provider if you have ever been abused or do not feel safe at home. Document Released: 11/17/2010 Document Revised: 09/18/2013 Document Reviewed: 04/05/2013 [facility] Patient Information 2015 [place], [place]. This information is not intended to replace advice given to you by your health care provider. Make sure you discuss any questions you have with your health care provider.

## 2014-08-17 NOTE — Progress Notes (Signed)
Pre-visit discussion using our clinic review tool. No additional management support is needed unless otherwise documented below in the visit note.  

## 2014-08-19 ENCOUNTER — Encounter: Payer: Self-pay | Admitting: Internal Medicine

## 2014-08-19 DIAGNOSIS — G8929 Other chronic pain: Secondary | ICD-10-CM | POA: Insufficient documentation

## 2014-08-19 DIAGNOSIS — M25519 Pain in unspecified shoulder: Secondary | ICD-10-CM | POA: Insufficient documentation

## 2014-08-19 DIAGNOSIS — M79673 Pain in unspecified foot: Secondary | ICD-10-CM

## 2014-08-19 HISTORY — DX: Other chronic pain: G89.29

## 2014-08-19 HISTORY — DX: Pain in unspecified shoulder: M25.519

## 2014-08-19 MED ORDER — CYANOCOBALAMIN 1000 MCG/ML IJ SOLN: INTRAMUSCULAR | Status: DC

## 2014-08-19 MED ORDER — SYRINGE (DISPOSABLE) 1 ML MISC: Status: DC

## 2014-08-19 NOTE — Assessment & Plan Note (Signed)
No relief with prior steroid injection by Orthopedics,  Discussed referral to sports medicine.

## 2014-08-19 NOTE — Assessment & Plan Note (Signed)
With Vit D deficiency,    I recommend to ontinue calcium 1200 to 1800 mg daily through diet and supplements ,and weight bearing exercise on a regular basis. .Discussed the current controversies surrounding the risks and benefits of calcium supplementation.  Encouraged her to increase dietary calcium through natural foods including almond/coconut milk

## 2014-08-19 NOTE — Assessment & Plan Note (Signed)
Patient has not had supplementation in close to 6 months and advised to resume monthly or sublingual .  Lab Results  Component Value Date   VITAMINB12 281 08/13/2014

## 2014-08-19 NOTE — Assessment & Plan Note (Signed)
I have addressed  BMI and recommended wt loss of 10% of body weigh over the next 6 months using a low glycemic index diet and regular exercise a minimum of 5 days per week.   Lab Results  Component Value Date   TSH 2.55 08/13/2014   No results found for: HGBA1C Lab Results  Component Value Date   CHOL 192 08/13/2014   HDL 38.80* 08/13/2014   LDLCALC 120* 08/13/2014   LDLDIRECT 171.3 05/23/2013   TRIG 164.0* 08/13/2014   CHOLHDL 5 08/13/2014

## 2014-08-19 NOTE — Assessment & Plan Note (Signed)
Unclear if she has a bone spur vs early plantar fasciitis.  She will discuss with Dr Tamala Julian

## 2014-08-19 NOTE — Assessment & Plan Note (Signed)
Recurrent,  Rx sent

## 2014-08-19 NOTE — Assessment & Plan Note (Signed)

## 2014-09-07 DIAGNOSIS — Z7689 Persons encountering health services in other specified circumstances: Secondary | ICD-10-CM

## 2014-09-13 ENCOUNTER — Telehealth: Payer: Self-pay | Admitting: *Deleted

## 2014-09-13 NOTE — Telephone Encounter (Signed)
Fax from insurance, needing PA for B12 injection. Started, given form to Dr. Derrel Nip for completion.

## 2014-09-13 NOTE — Telephone Encounter (Signed)
Done and returned 

## 2014-09-13 NOTE — Telephone Encounter (Signed)
In red folder. 

## 2014-09-14 NOTE — Telephone Encounter (Signed)
PA faxed and copy sent to billing and copy to scan.

## 2014-09-17 NOTE — Telephone Encounter (Signed)
Fax from [facility], B12 injection approved through 09/17/19

## 2014-09-18 ENCOUNTER — Other Ambulatory Visit (INDEPENDENT_AMBULATORY_CARE_PROVIDER_SITE_OTHER): Payer: BLUE CROSS/BLUE SHIELD

## 2014-09-18 ENCOUNTER — Ambulatory Visit (INDEPENDENT_AMBULATORY_CARE_PROVIDER_SITE_OTHER)
Admission: RE | Admit: 2014-09-18 | Discharge: 2014-09-18 | Disposition: A | Discharge status: Home / Self Care | Payer: BLUE CROSS/BLUE SHIELD | Source: Ambulatory Visit | Attending: Family Medicine | Admitting: Family Medicine

## 2014-09-18 ENCOUNTER — Encounter: Payer: Self-pay | Admitting: Family Medicine

## 2014-09-18 ENCOUNTER — Ambulatory Visit (INDEPENDENT_AMBULATORY_CARE_PROVIDER_SITE_OTHER): Payer: BLUE CROSS/BLUE SHIELD | Admitting: Family Medicine

## 2014-09-18 VITALS — BP 122/80 | HR 68 | Ht 66.5 in | Wt 225.0 lb

## 2014-09-18 DIAGNOSIS — M7502 Adhesive capsulitis of left shoulder: Secondary | ICD-10-CM | POA: Diagnosis not present

## 2014-09-18 DIAGNOSIS — M25512 Pain in left shoulder: Secondary | ICD-10-CM

## 2014-09-18 DIAGNOSIS — M7500 Adhesive capsulitis of unspecified shoulder: Secondary | ICD-10-CM | POA: Insufficient documentation

## 2014-09-18 NOTE — Assessment & Plan Note (Signed)
She was given an injection today with good resolution of pain but did not have any significant improvement in range of motion. I do think that this is frozen shoulder and we even discussed the possibility of formal physical therapy the patient will start with home exercises. We discussed icing regimen as well as topical anti-inflammatories and natural supplementations. Patient will come back and see me again in 3 weeks for further evaluation.

## 2014-09-18 NOTE — Progress Notes (Signed)
Pre visit review using our clinic review tool, if applicable. No additional management support is needed unless otherwise documented below in the visit note. 

## 2014-09-18 NOTE — Patient Instructions (Addendum)
Good to see you.  Ice 20 minutes 2 times daily. Usually after activity and before bed. Exercises 3 times a week.  Pennsaid twice daily as needed Turmeric 500mg  twice daily See me again in 3 weeks.

## 2014-09-18 NOTE — Progress Notes (Signed)
[facility] [address] [place], [place] [address] Phone: [phone] Subjective:    I'm seeing this patient by the request  of:  TULLO, Aris Everts, MD   CC: shoulder pain. Left  [id number] [facility] is a 64 y.o. female coming in with complaint of shoulder pain. Is left-sided. Patient states it has been going on for approximately 1 year. Patient did see another provider a year ago and had an injection did not make any significant improvement. Over the course of the last several weeks she has noted she is decreased range of motion. States that the pain seems to be radiating down her arm. Denies any numbness or weakness. Patient states that she sleeps on that side though can be severely tender. Patient rates the severity of 8 out of 10. Starting to affect her daily activities.  Past Medical History  Diagnosis Date  . Iron deficiency anemia, unspecified   . Internal hemorrhoids with other complication   . Otalgia, unspecified   . Swelling, mass, or lump in head and neck   . Blood in stool   . External hemorrhoids without mention of complication   . Personal history of unspecified circulatory disease    Past Surgical History  Procedure Laterality Date  . Fallopian tube removal      r tube  . Appendectomy    . In vitro fertilization  1985  . Breast biopsy      right  . Hemorrhoid surgery  11/2006   History  Substance Use Topics  . Smoking status: Former Smoker    Types: Cigarettes    Quit date: 01/11/1972  . Smokeless tobacco: Never Used     Comment: 1 1/2 years as a teen  . Alcohol Use: Yes     Comment: rare alcohol intake   No Known Allergies Family History  Problem Relation Age of Onset  . Heart disease Father 44    secodnary to Bright's disease  . Heart attack Father   . Hypertension Mother   . Bradycardia Mother   . Heart failure Mother   . Colon polyps Mother   . Diabetes Sister   . Hyperlipidemia Sister   . Cancer Sister     breast  . Hypertension Brother   . Diabetes Paternal Grandmother   . Diabetes Sister         Past medical history, social, surgical and family history all reviewed in electronic medical record.   Review of Systems: No headache, visual changes, nausea, vomiting, diarrhea, constipation, dizziness, abdominal pain, skin rash, fevers, chills, night sweats, weight loss, swollen lymph nodes, body aches, joint swelling, muscle aches, chest pain, shortness of breath, mood changes.   Objective Blood pressure 122/80, pulse 68, weight 225 lb (102.059 kg), SpO2 97 %.  General: No apparent distress alert and oriented x3 mood and affect normal, dressed appropriately.  HEENT: Pupils equal, extraocular movements intact  Respiratory: Patient's speak in full sentences and does not appear short of breath  Cardiovascular: No lower extremity edema, non tender, no erythema  Skin: Warm dry intact with no signs of infection or rash on extremities or on axial skeleton.  Abdomen: Soft nontender  Neuro: Cranial nerves II through XII are intact, neurovascularly intact in all extremities with 2+ DTRs and 2+ pulses.  Lymph: No lymphadenopathy of posterior or anterior cervical chain or axillae bilaterally.  Gait normal with good balance and coordination.  MSK:  Non tender with full range of motion and  good stability and symmetric strength and tone of  elbows, wrist, hip, knee and ankles bilaterally.  Neck: Inspection unremarkable. No palpable stepoffs. Mild positive Spurling's maneuver with radicular symptomsdown the left arm. Full neck range of motion Grip strength and sensation normal in bilateral hands Strength good C4 to T1 distribution No sensory change to C4 to T1 Negative Hoffman sign bilaterally Reflexes normal Shoulder: left Inspection reveals no abnormalities, atrophy or asymmetry. Palpation is normal with no tenderness over AC joint or bicipital groove. ROM decreased with only for flexion of 140  external rotation of 5 and internal rotation to sacrum. Rotator cuff strength normal throughout. signs of impingement with positive Neer and Hawkin's tests, but negative empty can sign. Speeds and Yergason's tests normal. No labral pathology noted with negative Obrien's, negative clunk and good stability. Normal scapular function observed. No painful arc and no drop arm sign. No apprehension sign Contralateral shoulder unremarkable  MSK US performed of: left This study was ordered, performed, and interpreted by [facility] D.O.  Shoulder:   Supraspinatus:  Appears normal on long and transverse views, Bursal bulge seen with shoulder abduction on impingement view. Infraspinatus:  Appears normal on long and transverse views. Significant increase in Doppler flow Subscapularis:  Appears normal on long and transverse views. Positive bursa Teres Minor:  Appears normal on long and transverse views. AC joint:  Moderate arthritis Glenohumeral Joint:  Appears normal without effusion.significant thickening of the posterior capsule Glenoid Labrum:  Intact without visualized tears. Biceps Tendon:  Appears normal on long and transverse views, no fraying of tendon, tendon located in intertubercular groove, no subluxation with shoulder internal or external rotation.  Impression: Subacromial bursitis, frozen shoulder  Procedure: Real-time Ultrasound Guided Injection of left glenohumeral joint Device: GE Logiq E  Ultrasound guided injection is preferred based studies that show increased duration, increased effect, greater accuracy, decreased procedural pain, increased response rate with ultrasound guided versus blind injection.  Verbal informed consent obtained.  Time-out conducted.  Noted no overlying erythema, induration, or other signs of local infection.  Skin prepped in a sterile fashion.  Local anesthesia: Topical Ethyl chloride.  With sterile technique and under real time ultrasound guidance:   Joint visualized.  23g 1  inch needle inserted posterior approach. Pictures taken for needle placement. Patient did have injection of 2 cc of 1% lidocaine, 2 cc of 0.5% Marcaine, and 1.0 cc of Kenalog 40 mg/dL. Completed without difficulty  Pain immediately resolved suggesting accurate placement of the medication.  Advised to call if fevers/chills, erythema, induration, drainage, or persistent bleeding.  Images permanently stored and available for review in the ultrasound unit.  Impression: Technically successful ultrasound guided injection.  Procedure note 53664; 15 minutes spent for Therapeutic exercises as stated in above notes.  This included exercises focusing on stretching, strengthening, with significant focus on eccentric aspects.  Shoulder Exercises that included:  Basic scapular stabilization to include adduction and depression of scapula Scaption, focusing on proper movement and good control Internal and External rotation utilizing a theraband, with elbow tucked at side entire time Rows with theraband Proper technique shown and discussed handout in great detail with ATC.  All questions were discussed and answered.     Impression and Recommendations:     This case required medical decision making of moderate complexity.

## 2014-10-05 ENCOUNTER — Encounter: Payer: Self-pay | Admitting: Internal Medicine

## 2014-10-09 ENCOUNTER — Ambulatory Visit: Payer: BLUE CROSS/BLUE SHIELD | Admitting: Family Medicine

## 2014-11-05 DIAGNOSIS — Z7689 Persons encountering health services in other specified circumstances: Secondary | ICD-10-CM

## 2014-11-06 ENCOUNTER — Telehealth: Payer: Self-pay | Admitting: Internal Medicine

## 2014-11-06 DIAGNOSIS — E559 Vitamin D deficiency, unspecified: Secondary | ICD-10-CM

## 2014-11-06 DIAGNOSIS — D519 Vitamin B12 deficiency anemia, unspecified: Secondary | ICD-10-CM

## 2014-11-06 DIAGNOSIS — Z0000 Encounter for general adult medical examination without abnormal findings: Secondary | ICD-10-CM

## 2014-11-06 DIAGNOSIS — E669 Obesity, unspecified: Secondary | ICD-10-CM

## 2014-11-06 NOTE — Telephone Encounter (Signed)
Patient stated she has always had fasting labs one per year and that she just had them 08/13/14. Please advise.

## 2014-11-06 NOTE — Telephone Encounter (Signed)
Clarification:  A fasting blood sugar was not done.  She still needs a fasting BMEt . Please cancel all future labs and get the BMEt

## 2014-11-06 NOTE — Telephone Encounter (Signed)
Patient scheduled labs cancelled Bmet added,

## 2014-11-06 NOTE — Telephone Encounter (Signed)
My mistake.  I did not seem then when i looked earlier

## 2014-11-06 NOTE — Telephone Encounter (Signed)
Forms not completed because she is  way overdue for labs.  please make fasting lab appt ASAp.  Labs ordered

## 2014-11-08 ENCOUNTER — Other Ambulatory Visit (INDEPENDENT_AMBULATORY_CARE_PROVIDER_SITE_OTHER): Payer: BLUE CROSS/BLUE SHIELD

## 2014-11-08 DIAGNOSIS — Z0000 Encounter for general adult medical examination without abnormal findings: Secondary | ICD-10-CM

## 2014-11-08 LAB — BASIC METABOLIC PANEL
BUN: 19 mg/dL (ref 6–23)
CO2: 28 mEq/L (ref 19–32)
Calcium: 9.1 mg/dL (ref 8.4–10.5)
Chloride: 106 mEq/L (ref 96–112)
Creatinine, Ser: 0.95 mg/dL (ref 0.40–1.20)
GFR: 63.09 mL/min (ref >60.00)
Glucose, Bld: 107 mg/dL — ABNORMAL HIGH (ref 70–99)
Potassium: 4.2 mEq/L (ref 3.5–5.1)
Sodium: 140 mEq/L (ref 135–145)

## 2014-11-11 ENCOUNTER — Encounter: Payer: Self-pay | Admitting: Internal Medicine

## 2014-11-23 ENCOUNTER — Telehealth: Payer: Self-pay | Admitting: Internal Medicine

## 2014-11-23 NOTE — Telephone Encounter (Signed)
The patient stated that she dropped off paper work that needed to be filled out regarding her insurance plan. Asking for a call back to see if that paper work has been mailed to her insurance company.

## 2014-11-29 NOTE — Telephone Encounter (Signed)
Form faxed to insurance and original mailed to patient as requested faxed on 11/15/14 and again on 11/29/14, notified patient by phone and apologized for delay in return phone call. Copy sent for billing and scan.

## 2015-07-17 DIAGNOSIS — N390 Urinary tract infection, site not specified: Secondary | ICD-10-CM

## 2015-07-17 HISTORY — DX: Urinary tract infection, site not specified: N39.0

## 2015-07-30 ENCOUNTER — Other Ambulatory Visit: Payer: Self-pay | Admitting: Urology

## 2015-07-30 DIAGNOSIS — R319 Hematuria, unspecified: Secondary | ICD-10-CM

## 2015-08-01 ENCOUNTER — Ambulatory Visit
Admission: RE | Admit: 2015-08-01 | Discharge: 2015-08-01 | Disposition: A | Discharge status: Home / Self Care | Payer: BLUE CROSS/BLUE SHIELD | Source: Ambulatory Visit | Attending: Urology | Admitting: Urology

## 2015-08-01 DIAGNOSIS — N329 Bladder disorder, unspecified: Secondary | ICD-10-CM | POA: Diagnosis not present

## 2015-08-01 DIAGNOSIS — K5730 Diverticulosis of large intestine without perforation or abscess without bleeding: Secondary | ICD-10-CM | POA: Diagnosis not present

## 2015-08-01 DIAGNOSIS — R310 Gross hematuria: Secondary | ICD-10-CM | POA: Diagnosis present

## 2015-08-01 DIAGNOSIS — M5489 Other dorsalgia: Secondary | ICD-10-CM | POA: Diagnosis present

## 2015-08-01 DIAGNOSIS — R319 Hematuria, unspecified: Secondary | ICD-10-CM

## 2015-08-01 LAB — POCT I-STAT CREATININE: Creatinine, Ser: 0.9 mg/dL (ref 0.44–1.00)

## 2015-08-01 MED ORDER — IOHEXOL 300 MG/ML  SOLN
125.0000 mL | Freq: Once | INTRAMUSCULAR | Status: AC | PRN
  Administered 2015-08-01: 125 mL via INTRAVENOUS

## 2015-08-03 IMAGING — CR DG CERVICAL SPINE COMPLETE 4+V
5 series · 5 of 5 positions shown · non-contrast
Comparison: None.

CLINICAL DATA: Chronic left shoulder pain without known injury

EXAM:
CERVICAL SPINE  4+ VIEWS

[view not recorded (1 of 5)]
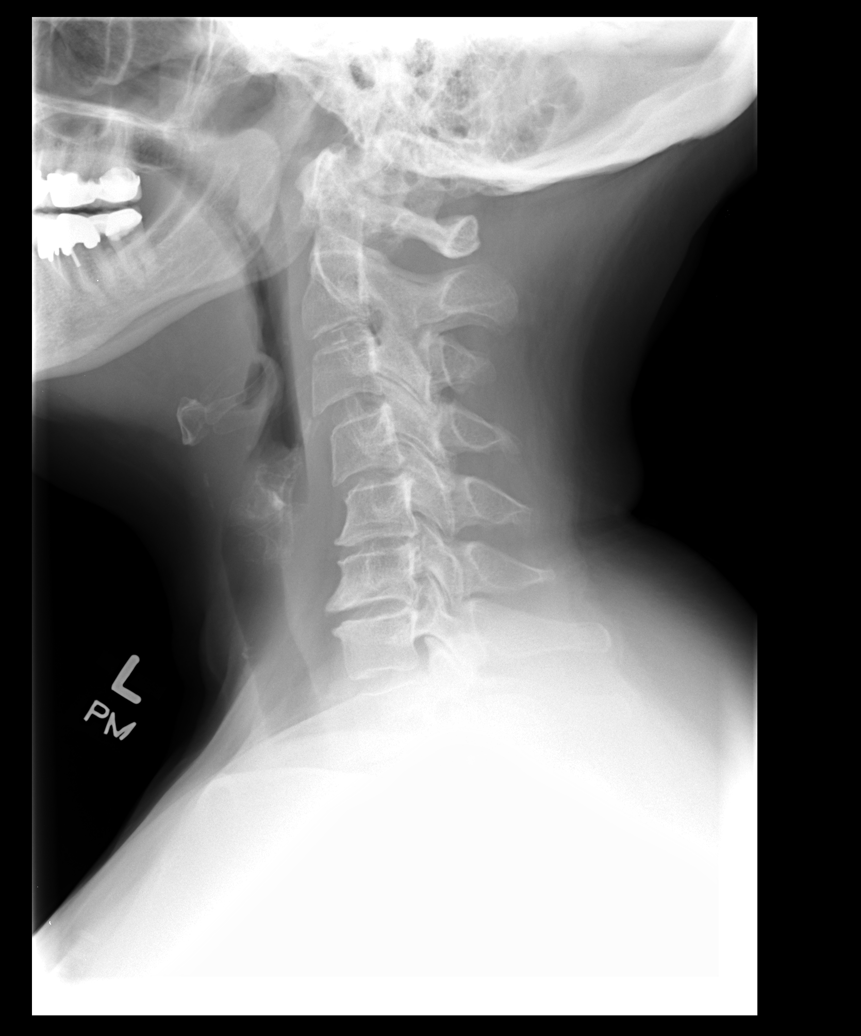

[view not recorded (2 of 5)]
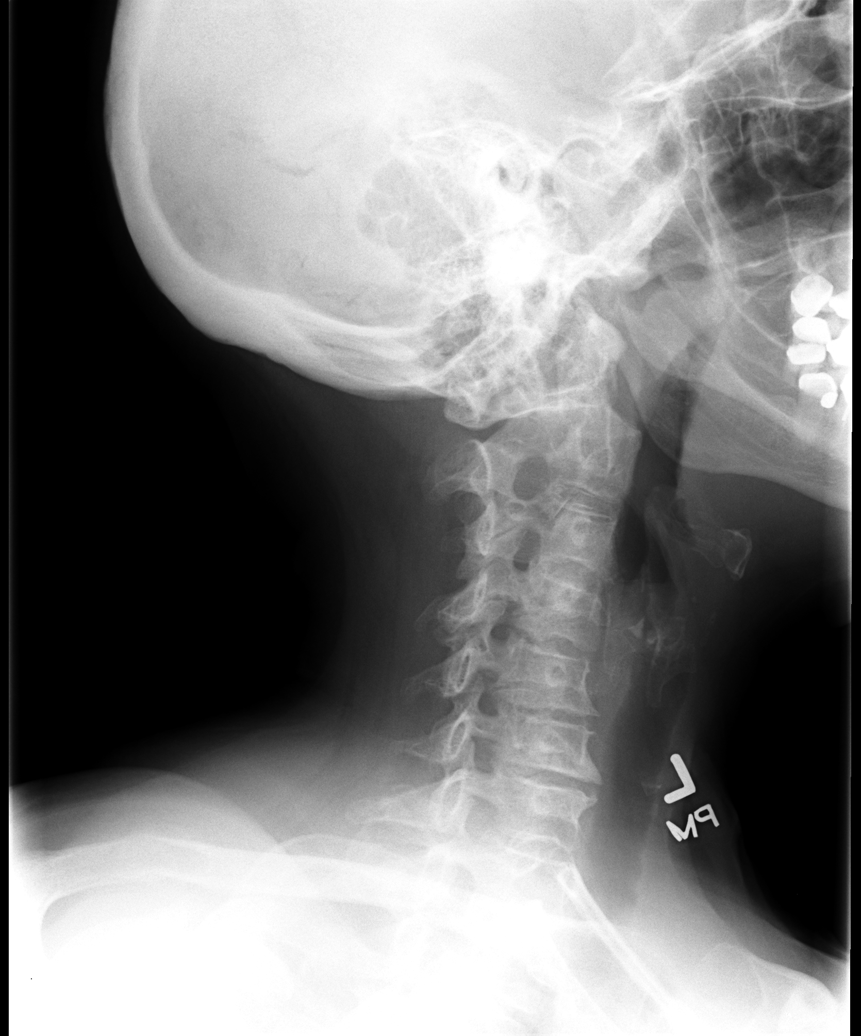

[view not recorded (3 of 5)]
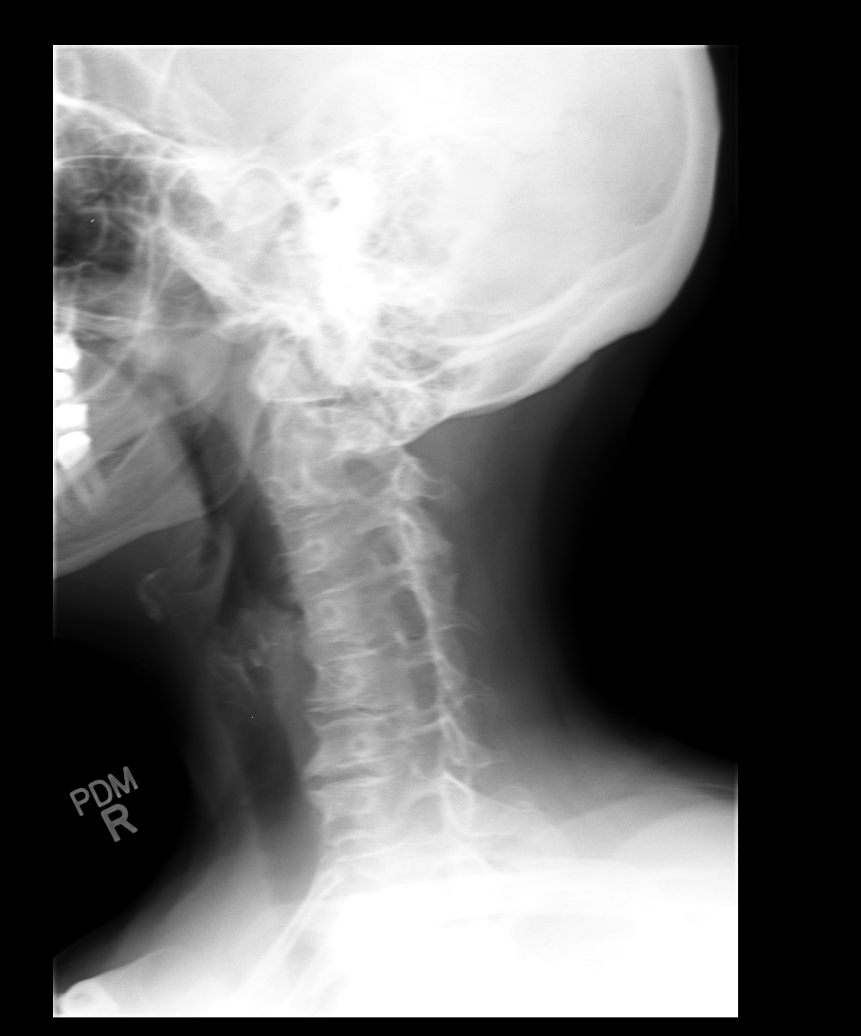

[view not recorded (4 of 5)]
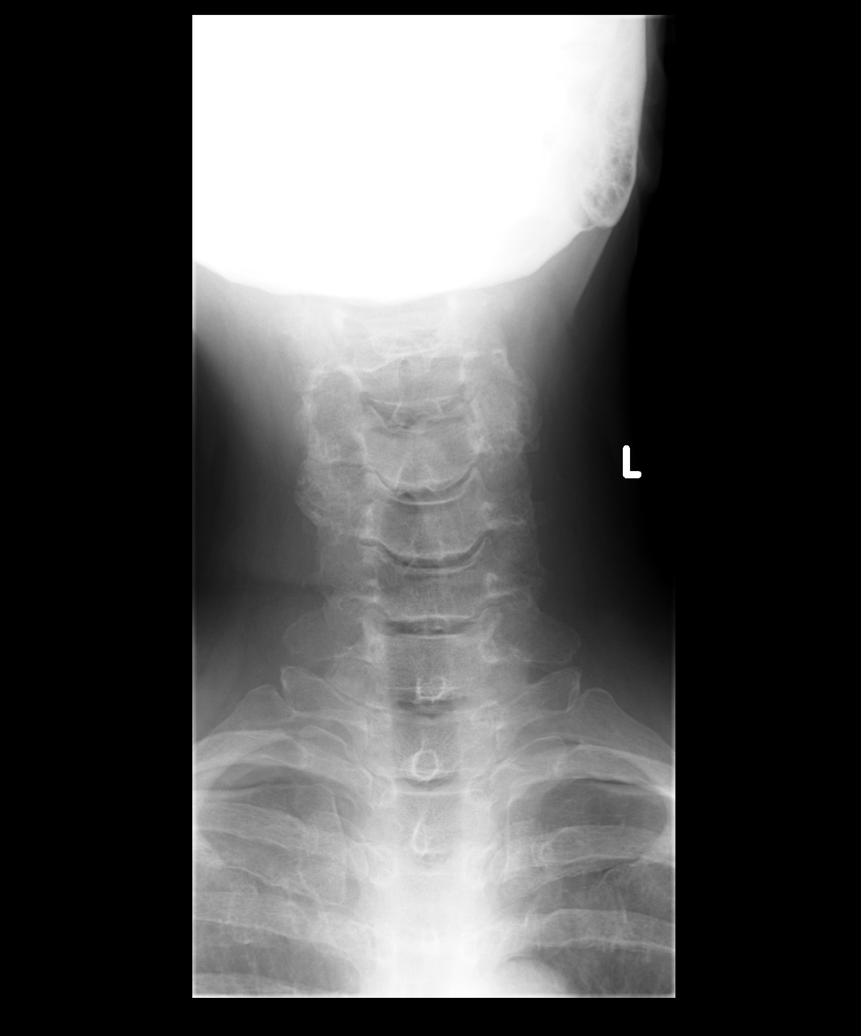

[view not recorded (5 of 5)]
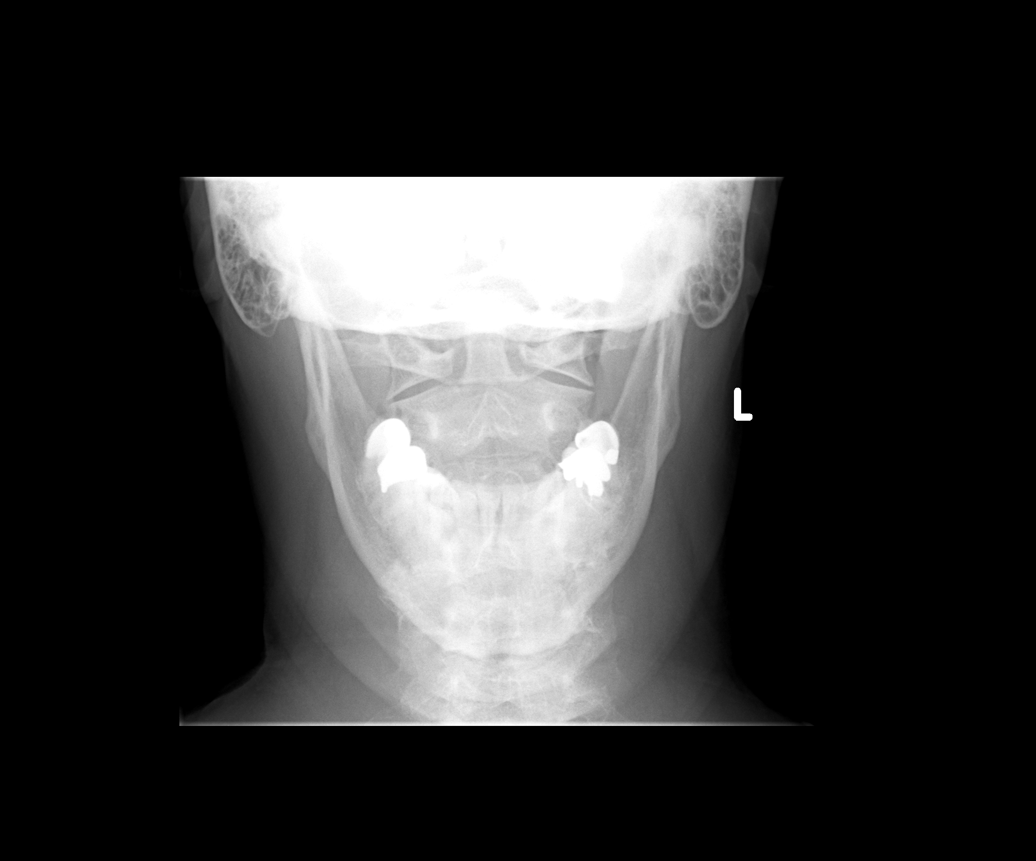

[5 of 5 positions shown; findings below may reference images not displayed]

FINDINGS: There is mild reversal of the normal cervical lordosis. The
vertebral bodies are preserved in height. There is mild disc space
narrowing at C5-6 and C6-7 with minimal narrowing at C4-5 and C 7
T1. There is no perched facet. The oblique views reveal mild bony
encroachment upon the neural foramina on the right from C3 through
C6. The odontoid is intact. There is degenerative change of the
C1-C2 articulation.
IMPRESSION: Reversal of the normal cervical lordosis is consistent with muscle
spasm. There is mild degenerative disc disease from C4 through C7.
There is facet joint hypertrophy on the right at multiple mid
cervical levels.

Given the findings here in the patient's symptoms, cervical spine
MRI may be useful if the patient can undergo the procedure.

## 2015-08-05 ENCOUNTER — Other Ambulatory Visit: Payer: BLUE CROSS/BLUE SHIELD

## 2015-08-05 ENCOUNTER — Encounter: Payer: Self-pay | Admitting: *Deleted

## 2015-08-05 NOTE — Patient Instructions (Signed)
  Your procedure is scheduled on: 08-06-15 Report to [facility] @ 1:30-PT NOTIFIED OF THIS   Remember: Instructions that are not followed completely may result in serious medical risk, up to and including death, or upon the discretion of your surgeon and anesthesiologist your surgery may need to be rescheduled.    _X___ 1. Do not eat food or drink liquids after midnight. No gum chewing or hard candies.     _X___ 2. No Alcohol for 24 hours before or after surgery.   ____ 3. Bring all medications with you on the day of surgery if instructed.    ____ 4. Notify your doctor if there is any change in your medical condition     (cold, fever, infections).     Do not wear jewelry, make-up, hairpins, clips or nail polish.  Do not wear lotions, powders, or perfumes. You may wear deodorant.  Do not shave 48 hours prior to surgery. Men may shave face and neck.  Do not bring valuables to the hospital.    [facility] is not responsible for any belongings or valuables.               Contacts, dentures or bridgework may not be worn into surgery.  Leave your suitcase in the car. After surgery it may be brought to your room.  For patients admitted to the hospital, discharge time is determined by your treatment team.   Patients discharged the day of surgery will not be allowed to drive home.   Please read over the following fact sheets that you were given:     ____ Take these medicines the morning of surgery with A SIP OF WATER:    1.  NONE  2.   3.   4.  5.  6.  ____ Fleet Enema (as directed)   ____ Use CHG Soap as directed  ____ Use inhalers on the day of surgery  ____ Stop metformin 2 days prior to surgery    ____ Take 1/2 of usual insulin dose the night before surgery and none on the morning of surgery.   ____ Stop Coumadin/Plavix/aspirin-N/A  ____ Stop Anti-inflammatories-NO NSAIDS OR ASA PRODUCTS-TYLENOL OK TO TAKE   ____ Stop supplements until after  surgery.    ____ Bring C-Pap to the hospital.

## 2015-08-06 ENCOUNTER — Encounter
Admission: RE | Disposition: A | Discharge status: Home / Self Care | Payer: Self-pay | Source: Ambulatory Visit | Attending: Urology

## 2015-08-06 ENCOUNTER — Ambulatory Visit: Payer: BLUE CROSS/BLUE SHIELD | Admitting: Anesthesiology

## 2015-08-06 ENCOUNTER — Ambulatory Visit
Admission: RE | Admit: 2015-08-06 | Discharge: 2015-08-06 | Disposition: A | Discharge status: Home / Self Care | Payer: BLUE CROSS/BLUE SHIELD | Source: Ambulatory Visit | Attending: Urology | Admitting: Urology

## 2015-08-06 DIAGNOSIS — Z87891 Personal history of nicotine dependence: Secondary | ICD-10-CM | POA: Insufficient documentation

## 2015-08-06 DIAGNOSIS — K219 Gastro-esophageal reflux disease without esophagitis: Secondary | ICD-10-CM | POA: Diagnosis not present

## 2015-08-06 DIAGNOSIS — C670 Malignant neoplasm of trigone of bladder: Secondary | ICD-10-CM

## 2015-08-06 DIAGNOSIS — C679 Malignant neoplasm of bladder, unspecified: Secondary | ICD-10-CM | POA: Diagnosis not present

## 2015-08-06 DIAGNOSIS — M1990 Unspecified osteoarthritis, unspecified site: Secondary | ICD-10-CM | POA: Insufficient documentation

## 2015-08-06 DIAGNOSIS — N359 Urethral stricture, unspecified: Secondary | ICD-10-CM | POA: Diagnosis not present

## 2015-08-06 DIAGNOSIS — D649 Anemia, unspecified: Secondary | ICD-10-CM | POA: Diagnosis not present

## 2015-08-06 DIAGNOSIS — M549 Dorsalgia, unspecified: Secondary | ICD-10-CM | POA: Insufficient documentation

## 2015-08-06 DIAGNOSIS — D494 Neoplasm of unspecified behavior of bladder: Secondary | ICD-10-CM | POA: Diagnosis present

## 2015-08-06 HISTORY — DX: Other specified postprocedural states: Z98.890

## 2015-08-06 HISTORY — PX: TRANSURETHRAL RESECTION OF BLADDER TUMOR: SHX2575

## 2015-08-06 HISTORY — DX: Adverse effect of unspecified anesthetic, initial encounter: T41.45XA

## 2015-08-06 HISTORY — DX: Urinary tract infection, site not specified: N39.0

## 2015-08-06 HISTORY — DX: Other complications of anesthesia, initial encounter: T88.59XA

## 2015-08-06 HISTORY — DX: Nausea with vomiting, unspecified: R11.2

## 2015-08-06 LAB — HEMOGLOBIN: Hemoglobin: 13.3 g/dL (ref 12.0–16.0)

## 2015-08-06 SURGERY — TURBT (TRANSURETHRAL RESECTION OF BLADDER TUMOR)
Anesthesia: General | Wound class: Clean Contaminated

## 2015-08-06 MED ORDER — ONDANSETRON HCL 4 MG/2ML IJ SOLN
INTRAMUSCULAR | Status: DC | PRN
  Administered 2015-08-06: 4 mg via INTRAVENOUS

## 2015-08-06 MED ORDER — NUCYNTA 50 MG PO TABS: 50.0000 mg | ORAL_TABLET | Freq: Four times a day (QID) | ORAL | Status: DC | PRN

## 2015-08-06 MED ORDER — LIDOCAINE HCL 2 % EX GEL
CUTANEOUS | Status: DC | PRN
  Administered 2015-08-06: 1 via URETHRAL

## 2015-08-06 MED ORDER — FENTANYL CITRATE (PF) 100 MCG/2ML IJ SOLN
INTRAMUSCULAR | Status: DC | PRN
  Administered 2015-08-06: 50 ug via INTRAVENOUS
  Administered 2015-08-06 (×2): 25 ug via INTRAVENOUS

## 2015-08-06 MED ORDER — BELLADONNA ALKALOIDS-OPIUM 16.2-60 MG RE SUPP
RECTAL | Status: AC
  Filled 2015-08-06: qty 1

## 2015-08-06 MED ORDER — PROPOFOL 10 MG/ML IV BOLUS
INTRAVENOUS | Status: DC | PRN
  Administered 2015-08-06: 170 mg via INTRAVENOUS

## 2015-08-06 MED ORDER — MITOMYCIN CHEMO FOR BLADDER INSTILLATION 40 MG
40.0000 mg | Freq: Once | INTRAVENOUS | Status: AC
  Administered 2015-08-06: 40 mg via INTRAVESICAL
  Filled 2015-08-06: qty 40

## 2015-08-06 MED ORDER — UROGESIC-BLUE 81.6 MG PO TABS: 1.0000 | ORAL_TABLET | Freq: Four times a day (QID) | ORAL | Status: DC | PRN

## 2015-08-06 MED ORDER — MIDAZOLAM HCL 5 MG/5ML IJ SOLN
INTRAMUSCULAR | Status: DC | PRN
  Administered 2015-08-06: 2 mg via INTRAVENOUS

## 2015-08-06 MED ORDER — FAMOTIDINE 20 MG PO TABS
ORAL_TABLET | ORAL | Status: AC
  Administered 2015-08-06: 20 mg via ORAL
  Filled 2015-08-06: qty 1

## 2015-08-06 MED ORDER — ONDANSETRON HCL 4 MG/2ML IJ SOLN: 4.0000 mg | Freq: Once | INTRAMUSCULAR | Status: DC | PRN

## 2015-08-06 MED ORDER — CEFAZOLIN SODIUM 1-5 GM-% IV SOLN
1.0000 g | Freq: Once | INTRAVENOUS | Status: AC
  Administered 2015-08-06: 1 g via INTRAVENOUS

## 2015-08-06 MED ORDER — CEFAZOLIN SODIUM 1-5 GM-% IV SOLN
INTRAVENOUS | Status: AC
  Administered 2015-08-06: 1 g via INTRAVENOUS
  Filled 2015-08-06: qty 50

## 2015-08-06 MED ORDER — LACTATED RINGERS IV SOLN: INTRAVENOUS | Status: DC | PRN

## 2015-08-06 MED ORDER — LIDOCAINE HCL (CARDIAC) 20 MG/ML IV SOLN
INTRAVENOUS | Status: DC | PRN
  Administered 2015-08-06: 60 mg via INTRAVENOUS

## 2015-08-06 MED ORDER — DEXAMETHASONE SODIUM PHOSPHATE 10 MG/ML IJ SOLN
INTRAMUSCULAR | Status: DC | PRN
  Administered 2015-08-06: 5 mg via INTRAVENOUS

## 2015-08-06 MED ORDER — LIDOCAINE HCL 2 % EX GEL
CUTANEOUS | Status: AC
  Filled 2015-08-06: qty 10

## 2015-08-06 MED ORDER — HYOSCYAMINE SULFATE 0.125 MG SL SUBL: 0.1250 mg | SUBLINGUAL_TABLET | SUBLINGUAL | Status: DC | PRN

## 2015-08-06 MED ORDER — DOCUSATE SODIUM 100 MG PO CAPS: 200.0000 mg | ORAL_CAPSULE | Freq: Two times a day (BID) | ORAL | Status: DC

## 2015-08-06 MED ORDER — LACTATED RINGERS IV SOLN
INTRAVENOUS | Status: DC
  Administered 2015-08-06: 14:00:00 via INTRAVENOUS

## 2015-08-06 MED ORDER — FENTANYL CITRATE (PF) 100 MCG/2ML IJ SOLN
25.0000 ug | INTRAMUSCULAR | Status: DC | PRN
  Administered 2015-08-06: 25 ug via INTRAVENOUS

## 2015-08-06 MED ORDER — BELLADONNA ALKALOIDS-OPIUM 16.2-60 MG RE SUPP
RECTAL | Status: DC | PRN
  Administered 2015-08-06: 1 via RECTAL

## 2015-08-06 MED ORDER — PHENYLEPHRINE HCL 10 MG/ML IJ SOLN
INTRAMUSCULAR | Status: DC | PRN
  Administered 2015-08-06: 100 ug via INTRAVENOUS

## 2015-08-06 MED ORDER — FAMOTIDINE 20 MG PO TABS
20.0000 mg | ORAL_TABLET | Freq: Once | ORAL | Status: AC
  Administered 2015-08-06: 20 mg via ORAL

## 2015-08-06 MED ORDER — FENTANYL CITRATE (PF) 100 MCG/2ML IJ SOLN
INTRAMUSCULAR | Status: AC
  Administered 2015-08-06: 25 ug via INTRAVENOUS
  Filled 2015-08-06: qty 2

## 2015-08-06 SURGICAL SUPPLY — 24 items
BAG DRAIN CYSTO-URO LG1000N (MISCELLANEOUS) ×2 IMPLANT
BAG URO DRAIN 2000ML W/SPOUT (MISCELLANEOUS) ×2 IMPLANT
CATH FOL 2WAY LX 18X30 (CATHETERS) ×2 IMPLANT
CATH FOLEY 2WAY  5CC 20FR SIL (CATHETERS)
CATH FOLEY 2WAY 5CC 20FR SIL (CATHETERS) IMPLANT
ELECT LOOP 22F BIPOLAR SML (ELECTROSURGICAL)
ELECT REM PT RETURN 9FT ADLT (ELECTROSURGICAL)
ELECTRODE LOOP 22F BIPOLAR SML (ELECTROSURGICAL) IMPLANT
ELECTRODE REM PT RTRN 9FT ADLT (ELECTROSURGICAL) IMPLANT
GLOVE BIO SURGEON STRL SZ7 (GLOVE) ×4 IMPLANT
GLOVE BIO SURGEON STRL SZ7.5 (GLOVE) ×2 IMPLANT
GOWN STRL REUS W/ TWL LRG LVL4 (GOWN DISPOSABLE) ×1 IMPLANT
GOWN STRL REUS W/ TWL XL LVL3 (GOWN DISPOSABLE) ×1 IMPLANT
GOWN STRL REUS W/TWL LRG LVL4 (GOWN DISPOSABLE) ×2
GOWN STRL REUS W/TWL XL LVL3 (GOWN DISPOSABLE) ×1
KIT RM TURNOVER CYSTO AR (KITS) ×2 IMPLANT
LOOP CUT BIPOLAR 24F LRG (ELECTROSURGICAL) ×2 IMPLANT
PACK CYSTO AR (MISCELLANEOUS) ×2 IMPLANT
PLUG CATH AND CAP STER (CATHETERS) ×2 IMPLANT
PREP PVP WINGED SPONGE (MISCELLANEOUS) ×2 IMPLANT
SET IRRIG Y TYPE TUR BLADDER L (SET/KITS/TRAYS/PACK) ×2 IMPLANT
SYRINGE IRR TOOMEY STRL 70CC (SYRINGE) ×2 IMPLANT
WATER STERILE IRR 1000ML POUR (IV SOLUTION) ×2 IMPLANT
WATER STERILE IRR 3000ML UROMA (IV SOLUTION) ×4 IMPLANT

## 2015-08-06 NOTE — Anesthesia Procedure Notes (Signed)
Procedure Name: LMA Insertion Date/Time: 08/06/2015 2:24 PM Performed by: Dionne Bucy Pre-anesthesia Checklist: Patient identified, Patient being monitored, Timeout performed, Emergency Drugs available and Suction available Patient Re-evaluated:Patient Re-evaluated prior to inductionOxygen Delivery Method: Circle system utilized Preoxygenation: Pre-oxygenation with 100% oxygen Intubation Type: IV induction Ventilation: Mask ventilation without difficulty LMA: LMA inserted LMA Size: 3.5 Tube type: Oral Number of attempts: 1 Placement Confirmation: positive ETCO2 and breath sounds checked- equal and bilateral Tube secured with: Tape Dental Injury: Teeth and Oropharynx as per pre-operative assessment

## 2015-08-06 NOTE — Transfer of Care (Signed)
Immediate Anesthesia Transfer of Care Note  Patient: Jo White  Procedure(s) Performed: Procedure(s): TRANSURETHRAL RESECTION OF BLADDER TUMOR (TURBT) (N/A)  Patient Location: PACU  Anesthesia Type:General  Level of Consciousness: awake and patient cooperative  Airway & Oxygen Therapy: Patient Spontanous Breathing and Patient connected to face mask oxygen  Post-op Assessment: Report given to RN  Post vital signs: Reviewed and stable  Last Vitals:  Filed Vitals:   08/06/15 1321 08/06/15 1540  BP: 160/118 140/100  Pulse: 84 70  Temp: 36.8 C 36.1 C  Resp: 16 16    Complications: No apparent anesthesia complications

## 2015-08-06 NOTE — Progress Notes (Signed)
Drained foley   125cc of gross bloody urine

## 2015-08-06 NOTE — Anesthesia Preprocedure Evaluation (Addendum)
Anesthesia Evaluation  Patient identified by MRN, date of birth, ID band Patient awake    Reviewed: Allergy & Precautions, NPO status , Patient's Chart, lab work & pertinent test results  History of Anesthesia Complications (+) PONV and history of anesthetic complications  Airway Mallampati: III  TM Distance: >3 FB Neck ROM: Limited  Mouth opening: Limited Mouth Opening  Dental  (+) Dental Advisory Given   Pulmonary former smoker,           Cardiovascular negative cardio ROS       Neuro/Psych negative neurological ROS  negative psych ROS   GI/Hepatic Neg liver ROS, GERD  Medicated and Controlled,  Endo/Other  negative endocrine ROS  Renal/GU negative Renal ROS Bladder dysfunction  Bladder tumor    Musculoskeletal  (+) Arthritis , Osteoarthritis,    Abdominal   Peds  Hematology  (+) anemia ,   Anesthesia Other Findings   Reproductive/Obstetrics                           Anesthesia Physical Anesthesia Plan  ASA: II  Anesthesia Plan: General   Post-op Pain Management:    Induction: Intravenous  Airway Management Planned: LMA  Additional Equipment:   Intra-op Plan:   Post-operative Plan: Extubation in OR  Informed Consent: I have reviewed the patients History and Physical, chart, labs and discussed the procedure including the risks, benefits and alternatives for the proposed anesthesia with the patient or authorized representative who has indicated his/her understanding and acceptance.   Dental advisory given  Plan Discussed with: Surgeon and CRNA  Anesthesia Plan Comments:        Anesthesia Quick Evaluation

## 2015-08-06 NOTE — Op Note (Signed)
Preoperative diagnosis: Bladder cancer Postoperative diagnosis: 1. Bladder cancer                                              2. Urethral stenosis  Procedure: 1. Transurethral resection of bladder tumor                       2. Instillation of mitomycin into the bladder                      3. Foley catheter placement                       4. Urethral dilatation  Surgeon: Otelia Limes. Yves Dill MD, FACS  Anesthesia: Gen.  Indications:See the history and physical. After informed consent the above procedure(s) were requested     Technique and findings: After adequate general anesthesia had been obtained patient was placed into dorsal lithotomy position and the perineum was prepped and draped in the usual fashion. An attempt was made to place the resectoscope but the urethra was stenotic. Therefore, the urethra was dilated from 40 [place] up to 25 [place] with the female dilators. There was narrowing and stenosis at 20 [place]. At this point the 76 [place] sector scope sheath was advanced into the bladder with the obturator in place. The resectoscope was then coupled to the camera and then placed into the sheath. The bladder was thoroughly inspected. Right ureteral orifice was identified and had clear reflux. Patient had a 3 x 3 cm papillary tumor obscuring the left renal orifice and left lateral trigone. At this point, the lateral portion of the tumor was resected and fragments submitted to pathology. Several deep muscle fragments were also resected and submitted separately. Upon approaching the ureteral orifice several short papillary fronds were seen surrounding the orifice. No papillary fronds were seen then the orifice. The shorter fronds surrounding orifice were cauterized carefully and the ureteral orifice was preserved. This point the loop electrode was exchanged for the rollerball electrode. The resection site was thoroughly cauterized taking care to avoid the ureteral orifice. This point the bladder was  again irrigated and several small fragments removed. Reinspection of the bladder did not reveal any further residual tumor fragments. The resectoscope was removed and 10 cc of viscous Xylocaine instilled within the urethra and the bladder. An 52 French Foley catheter was placed. After had clear drainage. 40 mg of mitomycin was instilled within the bladder and the catheter was plugged. A B&O suppository was placed. Procedure was then terminated and patient transferred to the recovery room in stable condition.

## 2015-08-06 NOTE — H&P (Addendum)
Date of Initial H&P: 07/30/15  History reviewed, patient examined, no change in status, stable for surgery.   Exam:  Lungs clear to auscultation. RRR without M or G.  Plan TURBT.

## 2015-08-06 NOTE — Discharge Instructions (Signed)
Bladder Cancer Bladder cancer is an abnormal growth of tissue in your bladder. Your bladder is the balloon-like sac in your pelvis. It collects and stores urine that comes from the kidneys through the ureters. The bladder wall is made of layers. If cancer spreads into these layers and through the wall of the bladder, it becomes more difficult to treat.  There are four stages of bladder cancer:  Stage I. Cancer at this stage occurs in the bladder's inner lining but has not invaded the muscular bladder wall.  Stage II. At this stage, cancer has invaded the bladder wall but is still confined to the bladder.  Stage III. By this stage, the cancer cells have spread through the bladder wall to surrounding tissue. They may also have spread to the prostate in men or the uterus or vagina in women.  Stage IV. By this stage, cancer cells may have spread to the lymph nodes and other organs, such as your lungs, bones, or liver. RISK FACTORS Although the cause of bladder cancer is not known, the following risk factors can increase your chances of getting bladder cancer:   Smoking.   Occupational exposures, such as rubber, leather, textile, dyes, chemicals, and paint.  Being white.  Age.   Being female.   Having chronic bladder inflammation.   Having a bladder cancer history.   Having a family history of bladder cancer (heredity).   Having had chemotherapy or radiation therapy to the pelvis.   Being exposed to arsenic.  SYMPTOMS   Blood in the urine.   Pain with urination.   Frequent bladder or urine infections.  Increase in urgency and frequency of urination. DIAGNOSIS  Your health care provider may suspect bladder cancer based on your description of urinary symptoms or based on the finding of blood or infection in the urine (especially if this has recurred several times). Other tests or procedures that may be performed include:   A narrow tube being inserted into your bladder  through your urethra (cystoscopy) in order to view the lining of your bladder for tumors.   A biopsy to sample the tumor to see if cancer is present.  If cancer is present, it will then be staged to determine its severity and extent. It is important to know how deeply into the bladder wall the cancer has grown and whether the cancer has spread to any other parts of your body. Staging may require blood tests or special scans such as a CT scan, MRI, bone scan, or chest X-ray.  TREATMENT  Once your cancer has been diagnosed and staged, you should discuss a treatment plan with your health care provider. Based on the stage of the cancer, one treatment or a combination of treatments may be recommended. The most common forms of treatment are:   Surgery. Procedures that may be done include transurethral resection and cystectomy.  Radiation therapy. This is infrequently used to treat bladder cancer.   Chemotherapy. During this treatment, drugs are used to kill cancer cells.  Immunotherapy. This is usually administered directly into the bladder. HOME CARE INSTRUCTIONS  Take medicines only as directed by your health care provider.   Maintain a healthy diet.   Consider joining a support group. This may help you learn to cope with the stress of having bladder cancer.   Seek advice to help you manage treatment side effects.   Keep all follow-up visits as directed by your health care provider.   Inform your cancer specialist if you are  admitted to the hospital.  [place] IF:  There is blood in your urine.  You have symptoms of a urinary tract infection. These include:  Tiredness.  Shakiness.  Weakness.  Muscle aches.  Abdominal pain.  Frequent and intense urge to urinate (in young women).  Burning feeling in the bladder or urethra during urination (in young women). SEEK IMMEDIATE MEDICAL CARE IF:  You are unable to urinate.   This information is not intended to  replace advice given to you by your health care provider. Make sure you discuss any questions you have with your health care provider.   Document Released: 05/07/2003 Document Revised: 05/25/2014 Document Reviewed: 10/25/2012 Elsevier Interactive Patient Education 2016 [employer]  Transurethral Resection, Bladder Tumor A cancerous growth (tumor) can develop on the inside wall of the bladder. The bladder is the organ that holds urine. One way to remove the tumor is a procedure called a transurethral resection. The tumor is removed (resected) through the tube that carries urine from the bladder out of the body (urethra). No cuts (incisions) are made in the skin. Instead, the procedure is done through a thin telescope, called a resectoscope. Attached to it is a light and usually a tiny camera. The resectoscope is put into the urethra. In men, the urethra opens at the end of the penis. In women, it opens just above the vagina.  A transurethral resection is usually used to remove tumors that have not gotten too big or too deep. These are called Stage 0, Stage 1 or Stage 2 bladder cancers. LET YOUR CAREGIVER KNOW ABOUT:  On the day of the procedure, your caregivers will need to know the last time you had anything to eat or drink. This includes water, gum, and candy. In advance, make sure they know about:   Any allergies.  All medications you are taking, including:  Herbs, eyedrops, over-the-counter medications and creams.  Blood thinners (anticoagulants), aspirin or other drugs that could affect blood clotting.  Use of steroids (by mouth or as creams).  Previous problems with anesthetics, including local anesthetics.  Possibility of pregnancy, if this applies.  Any history of blood clots.  Any history of bleeding or other blood problems.  Previous surgery.  Smoking history.  Any recent symptoms of colds or infections.  Other health problems. RISKS AND COMPLICATIONS This is usually  a safe procedure. Every procedure has risks, though. For a transurethral resection, they include:  Infection. Antibiotic medication would need to be taken.  Bleeding.  Light bleeding may last for several days after the procedure.  If bleeding continues or is heavy, the bladder may need rinsing. Or, a new catheter might be put in for awhile.  Sometimes bed rest is needed.  Urination problems.  Pain and burning can occur when urinating. This usually goes away in a few days.  Scarring from the procedure can block the flow of urine.  Bladder damage.  It can be punctured or torn during removal of the tumor. If this happens, a catheter might be needed for longer. Antibiotics would be taken while the bladder heals.  Urine can leak through the hole or tear into the abdomen. If this happens, surgery may be needed to repair the bladder. BEFORE THE PROCEDURE   A medical evaluation will be done. This may include:  A physical examination.  Urine test. This is to make sure you do not have a urinary tract infection.  Blood tests.  A test that checks the heart's rhythm (  electrocardiogram).  Talking with an anesthesiologist. This is the person who will be in charge of the medication (anesthesia) to keep you from feeling pain during the transurethral resection. You might be asleep during the procedure (general anesthesia) or numb from the waist down, but awake during the procedure (spinal anesthesia). Ask your surgeon what to expect.  The person who is having a transurethral resection needs to give what is called informed consent. This requires signing a legal paper that gives permission for the procedure. To give informed consent:  You must understand how the procedure is done and why.  You must be told all the risks and benefits of the procedure.  You must sign the consent. Sometimes a legal guardian can do this.  Signing should be witnessed by a healthcare professional.  The day  before the surgery, eat only a light dinner. Then, do not eat or drink anything for at least 8 hours before the surgery. Ask your caregiver if it is OK to take any needed medicines with a sip of water.  Arrive at least an hour before the surgery or whenever your surgeon recommends. This will give you time to check in and fill out any needed paperwork. PROCEDURE  The preparation:  You will change into a hospital gown.  A needle will be inserted in your arm. This is an intravenous access tube (IV). Medication will be able to flow directly into your body through this needle.  Small monitors will be put on your body. They are used to check your heart, blood pressure, and oxygen level.  You might be given medication that will help you relax (sedative).  You will be given a general anesthetic or spinal anesthesia.  The procedure:  Once you are asleep or numb from the waist down, your legs will be placed in stirrups.  The resectoscope will be passed through the urethra into the bladder.  Fluid will be passed through the resectoscope. This will fill the bladder with water.  The surgeon will examine the bladder through the scope. If the scope has a camera, it can take pictures from inside the bladder. They can be projected onto a TV screen.  The surgeon will use various tools to remove the tumor in small pieces. Sometimes a laser (a beam of light energy) is used. Other tools may use electric current.  A tube (catheter) will often be placed so that urine can drain into a bag outside the body. This process helps stop bleeding. This tube keeps blood clots from blocking the urethra.  The procedure usually takes 30 to 45 minutes. AFTER THE PROCEDURE   You will stay in a recovery area until the anesthesia has worn off. Your blood pressure and pulse will be checked every so often. Then you will be taken to a hospital room.  You may continue to get fluids through the IV for awhile.  Some pain is  normal. The catheter might be uncomfortable. Pain is usually not severe. If it is, ask for pain medicine.  Your urine may look bloody after a transurethral resection. This is normal.  If bleeding is heavy, a hospital caregiver may rinse out the bladder (irrigation) through the catheter.  Once the urine is clear, the catheter will be taken out.  You will need to stay in the hospital until you can urinate on your own.  Most people stay in the hospital for up to 4 days. PROGNOSIS   Transurethral resection is considered the best way to treat  bladder tumors that are not too far along. For most people, the treatment is successful. Sometimes, though, more treatment is needed.  Bladder cancers can come back even after a successful procedure. Because of this, be sure to have a checkup with your caregiver every 3 to 6 months. If everything is OK for 3 years, you can reduce the checkups to once a year.   This information is not intended to replace advice given to you by your health care provider. Make sure you discuss any questions you have with your health care provider.   Document Released: 02/28/2009 Document Revised: 07/27/2011 Document Reviewed: 05/06/2009 Elsevier Interactive Patient Education 2016 [place]   1) The drugs that you were given will stay in your system until tomorrow so for the next 24 hours you should not:  A) Drive an automobile B) Make any legal decisions C) Drink any alcoholic beverage   2) You may resume regular meals tomorrow.  Today it is better to start with liquids and gradually work up to solid foods.  You may eat anything you prefer, but it is better to start with liquids, then soup and crackers, and gradually work up to solid foods.   3) Please notify your doctor immediately if you have any unusual bleeding, trouble breathing, redness and pain at the surgery site, drainage, fever, or pain not relieved by  medication.    4) Additional Instructions:    Please contact your physician with any problems or Same Day Surgery at [phone], Monday through Friday 6 am to 4 pm, or [place] at [facility] number at [phone].

## 2015-08-06 NOTE — OR Nursing (Signed)
Mitomycin instilled at 1524pm.

## 2015-08-06 NOTE — Progress Notes (Signed)
Feels like needs to void  Cath intact and plugged  Pt does not want medication at present

## 2015-08-06 NOTE — Anesthesia Postprocedure Evaluation (Signed)
Anesthesia Post Note  Patient: Jo White  Procedure(s) Performed: Procedure(s) (LRB): TRANSURETHRAL RESECTION OF BLADDER TUMOR (TURBT) (N/A)  Patient location during evaluation: PACU Anesthesia Type: General Level of consciousness: awake and alert Pain management: pain level controlled Vital Signs Assessment: post-procedure vital signs reviewed and stable Respiratory status: spontaneous breathing, nonlabored ventilation, respiratory function stable and patient connected to nasal cannula oxygen Cardiovascular status: blood pressure returned to baseline and stable Postop Assessment: no signs of nausea or vomiting Anesthetic complications: no    Last Vitals:  Filed Vitals:   08/06/15 1653 08/06/15 1729  BP: 153/77 166/81  Pulse: 71 73  Temp: 36.5 C   Resp: 16 16    Last Pain:  Filed Vitals:   08/06/15 1739  PainSc: 1                  Martha Clan

## 2015-08-07 ENCOUNTER — Encounter: Payer: Self-pay | Admitting: Urology

## 2015-08-08 LAB — SURGICAL PATHOLOGY

## 2015-09-17 ENCOUNTER — Encounter: Payer: Self-pay | Admitting: Gastroenterology

## 2015-10-25 ENCOUNTER — Other Ambulatory Visit: Payer: Self-pay | Admitting: Internal Medicine

## 2015-10-25 DIAGNOSIS — E785 Hyperlipidemia, unspecified: Secondary | ICD-10-CM

## 2015-10-25 DIAGNOSIS — R5383 Other fatigue: Secondary | ICD-10-CM

## 2015-10-25 DIAGNOSIS — E559 Vitamin D deficiency, unspecified: Secondary | ICD-10-CM

## 2015-10-25 NOTE — Telephone Encounter (Signed)
Patient seems to have chronically low Vit d but last level was 3/16 ok to refill Drisdol?

## 2015-10-25 NOTE — Telephone Encounter (Signed)
HAS NOT BEEN SEEN IN A YEAR.  VIT D  HAS BEEN  REFILLED BUT NEEDS TO MAKE APPT FOR FASTIN GLABS AND OFFICE VISIT

## 2016-04-22 ENCOUNTER — Other Ambulatory Visit: Payer: Self-pay | Admitting: Internal Medicine

## 2016-05-29 ENCOUNTER — Other Ambulatory Visit (HOSPITAL_COMMUNITY)
Admission: RE | Admit: 2016-05-29 | Discharge: 2016-05-29 | Disposition: A | Discharge status: Home / Self Care | Payer: BLUE CROSS/BLUE SHIELD | Source: Ambulatory Visit | Attending: Internal Medicine | Admitting: Internal Medicine

## 2016-05-29 ENCOUNTER — Ambulatory Visit (INDEPENDENT_AMBULATORY_CARE_PROVIDER_SITE_OTHER): Payer: BLUE CROSS/BLUE SHIELD | Admitting: Internal Medicine

## 2016-05-29 ENCOUNTER — Encounter: Payer: Self-pay | Admitting: Internal Medicine

## 2016-05-29 VITALS — BP 144/90 | HR 71 | Temp 98.1°F | Ht 66.0 in | Wt 225.0 lb

## 2016-05-29 DIAGNOSIS — Z1231 Encounter for screening mammogram for malignant neoplasm of breast: Secondary | ICD-10-CM

## 2016-05-29 DIAGNOSIS — E7800 Pure hypercholesterolemia, unspecified: Secondary | ICD-10-CM

## 2016-05-29 DIAGNOSIS — Z01419 Encounter for gynecological examination (general) (routine) without abnormal findings: Secondary | ICD-10-CM | POA: Insufficient documentation

## 2016-05-29 DIAGNOSIS — D513 Other dietary vitamin B12 deficiency anemia: Secondary | ICD-10-CM

## 2016-05-29 DIAGNOSIS — Z6833 Body mass index (BMI) 33.0-33.9, adult: Secondary | ICD-10-CM

## 2016-05-29 DIAGNOSIS — Z0000 Encounter for general adult medical examination without abnormal findings: Secondary | ICD-10-CM | POA: Diagnosis not present

## 2016-05-29 DIAGNOSIS — Z1151 Encounter for screening for human papillomavirus (HPV): Secondary | ICD-10-CM | POA: Insufficient documentation

## 2016-05-29 DIAGNOSIS — Z1239 Encounter for other screening for malignant neoplasm of breast: Secondary | ICD-10-CM

## 2016-05-29 DIAGNOSIS — Z1211 Encounter for screening for malignant neoplasm of colon: Secondary | ICD-10-CM

## 2016-05-29 DIAGNOSIS — C679 Malignant neoplasm of bladder, unspecified: Secondary | ICD-10-CM

## 2016-05-29 DIAGNOSIS — Z124 Encounter for screening for malignant neoplasm of cervix: Secondary | ICD-10-CM | POA: Diagnosis not present

## 2016-05-29 DIAGNOSIS — E6609 Other obesity due to excess calories: Secondary | ICD-10-CM

## 2016-05-29 DIAGNOSIS — E538 Deficiency of other specified B group vitamins: Secondary | ICD-10-CM

## 2016-05-29 NOTE — Progress Notes (Signed)
Patient ID: Jo White, female    DOB: 1951-01-29  Age: 66 y.o. MRN: [id number]  The patient is here for annual  wellness examination and management of other chronic and acute problems.  Last seen April 2016 Bladder cancer diagnosed s/p TURB, mitomycin irrigation and urethral dilation  March 2017 Rogers Blocker) Mammogram 2014 [facility]  Normal  PAP normal 2014  Tubular adenoma  2012,  Due 2017 Oretha Caprice)   Retired in April 2017.         The risk factors are reflected in the social history.  The roster of all physicians providing medical care to patient - is listed in the Snapshot section of the chart.  Activities of daily living:  The patient is 100% independent in all ADLs: dressing, toileting, feeding as well as independent mobility  Home safety : The patient has smoke detectors in the home. They wear seatbelts.  There are no firearms at home. There is no violence in the home.   There is no risks for hepatitis, STDs or HIV. There is no   history of blood transfusion. They have no travel history to infectious disease endemic areas of the world.  The patient has seen their dentist in the last six month. They have seen their eye doctor in the last year. They admit to slight hearing difficulty with regard to whispered voices and some television programs.  They have deferred audiologic testing in the last year.  They do not  have excessive sun exposure. Discussed the need for sun protection: hats, long sleeves and use of sunscreen if there is significant sun exposure.   Diet: the importance of a healthy diet is discussed. They do have a healthy diet.  The benefits of regular aerobic exercise were discussed. She walks 4 times per week ,  20 minutes.   Depression screen: there are no signs or vegative symptoms of depression- irritability, change in appetite, anhedonia, sadness/tearfullness.  Cognitive assessment: the patient manages all their financial and personal affairs and is actively engaged.  They could relate day,date,year and events; recalled 2/3 objects at 3 minutes; performed clock-face test normally.  The following portions of the patient's history were reviewed and updated as appropriate: allergies, current medications, past family history, past medical history,  past surgical history, past social history  and problem list.  Visual acuity was not assessed per patient preference since she has regular follow up with her ophthalmologist. Hearing and body mass index were assessed and reviewed.   During the course of the visit the patient was educated and counseled about appropriate screening and preventive services including : fall prevention , diabetes screening, nutrition counseling, colorectal cancer screening, and recommended immunizations.    CC: The primary encounter diagnosis was Colon cancer screening. Diagnoses of Breast cancer screening, high risk patient, Cervical cancer screening, B12 deficiency, Encounter for preventive health examination, Class 1 obesity due to excess calories without serious comorbidity with body mass index (BMI) of 33.0 to 33.9 in adult, Other dietary vitamin B12 deficiency anemia, High cholesterol, and Malignant neoplasm of urinary bladder, unspecified site [facility]) were also pertinent to this visit.  No acute issues today  History Baileyann has a past medical history of Blood in stool; Complication of anesthesia; External hemorrhoids without mention of complication; Internal hemorrhoids with other complication; Iron deficiency anemia, unspecified; Otalgia, unspecified; Personal history of unspecified circulatory disease; PONV (postoperative nausea and vomiting); Swelling, mass, or lump in head and neck; and UTI (urinary tract infection) (07-2015).   She has  a past surgical history that includes fallopian tube removal; Appendectomy; in vitro fertilization (1985); Breast biopsy; Hemorrhoid surgery (11/2006); Diagnostic laparoscopy; Colonoscopy; and Transurethral  resection of bladder tumor (N/A, 08/06/2015).   Her family history includes Bradycardia in her mother; Cancer in her sister; Colon polyps in her mother; Diabetes in her paternal grandmother, sister, and sister; Heart attack in her father; Heart disease (age of onset: 7) in her father; Heart failure in her mother; Hyperlipidemia in her sister; Hypertension in her brother and mother.She reports that she quit smoking about 44 years ago. Her smoking use included Cigarettes. She has a 1.50 pack-year smoking history. She has never used smokeless tobacco. She reports that she drinks alcohol. She reports that she does not use drugs.  Outpatient Medications Prior to Visit  Medication Sig Dispense Refill  . levofloxacin (LEVAQUIN) 500 MG tablet Take 500 mg by mouth daily.    . Vitamin D, Ergocalciferol, (DRISDOL) 50000 units CAPS capsule TAKE 1 CAPSULE ONCE A WEEK 12 capsule 4  . cyanocobalamin (,VITAMIN B-12,) 1000 MCG/ML injection INJECT 1 ML INTO THE MUSCLE ONCE MONTHLY 10 mL 0  . docusate sodium (COLACE) 100 MG capsule Take 2 capsules (200 mg total) by mouth 2 (two) times daily. 120 capsule 3  . hyoscyamine (LEVSIN/SL) 0.125 MG SL tablet Place 1 tablet (0.125 mg total) under the tongue every 4 (four) hours as needed (bladder spasm). 1-2 TABS 40 tablet 3  . Methen-Hyosc-Meth Blue-Na Phos (UROGESIC-BLUE) 81.6 MG TABS Take 1 tablet (81.6 mg total) by mouth every 6 (six) hours as needed (dysuria). 40 tablet 3  . NUCYNTA 50 MG TABS tablet Take 1 tablet (50 mg total) by mouth every 6 (six) hours as needed for moderate pain. 1 TO 2 TABS Q 6 HOURS PRN PAIN 30 tablet 0   No facility-administered medications prior to visit.     Review of Systems   Patient denies headache, fevers, malaise, unintentional weight loss, skin rash, eye pain, sinus congestion and sinus pain, sore throat, dysphagia,  hemoptysis , cough, dyspnea, wheezing, chest pain, palpitations, orthopnea, edema, abdominal pain, nausea, melena,  diarrhea, constipation, flank pain, dysuria, hematuria, urinary  Frequency, nocturia, numbness, tingling, seizures,  Focal weakness, Loss of consciousness,  Tremor, insomnia, depression, anxiety, and suicidal ideation.      Objective:  BP (!) 144/90   Pulse 71   Temp 98.1 F (36.7 C) (Oral)   Ht 5\' 6"  (1.676 m)   Wt 225 lb (102.1 kg)   SpO2 97%   BMI 36.32 kg/m   Physical Exam   General Appearance:    Alert, cooperative, no distress, appears stated age  Head:    Normocephalic, without obvious abnormality, atraumatic  Eyes:    PERRL, conjunctiva/corneas clear, EOM's intact, fundi    benign, both eyes  Ears:    Normal TM's and external ear canals, both ears  Nose:   Nares normal, septum midline, mucosa normal, no drainage    or sinus tenderness  Throat:   Lips, mucosa, and tongue normal; teeth and gums normal  Neck:   Supple, symmetrical, trachea midline, no adenopathy;    thyroid:  no enlargement/tenderness/nodules; no carotid   bruit or JVD  Back:     Symmetric, no curvature, ROM normal, no CVA tenderness  Lungs:     Clear to auscultation bilaterally, respirations unlabored  Chest Wall:    No tenderness or deformity   Heart:    Regular rate and rhythm, S1 and S2 normal, no murmur, rub  or gallop  Breast Exam:    No tenderness, masses, or nipple abnormality  Abdomen:     Soft, non-tender, bowel sounds active all four quadrants,    no masses, no organomegaly  Genitalia:    Pelvic: cervix normal in appearance, external genitalia normal, no adnexal masses or tenderness, no cervical motion tenderness, rectovaginal septum normal, uterus normal size, shape, and consistency and vagina normal without discharge  Extremities:   Extremities normal, atraumatic, no cyanosis or edema  Pulses:   2+ and symmetric all extremities  Skin:   Skin color, texture, turgor normal, no rashes or lesions  Lymph nodes:   Cervical, supraclavicular, and axillary nodes normal  Neurologic:   CNII-XII  intact, normal strength, sensation and reflexes    throughout        Assessment & Plan:   Problem List Items Addressed This Visit    B12 deficiency anemia    Managed with monthly IM injections      Bladder cancer ([place])    Found during workup for hematuria in the setting of infection. She was  diagnosed and treated within two weeks, s/p TURB, mitomycin irrigation and urethral dilation  March 2017 Rogers Blocker)      Encounter for preventive health examination    Annual comprehensive preventive exam was done as well as an evaluation and management of chronic conditions .  During the course of the visit the patient was educated and counseled about appropriate screening and preventive services including :  diabetes screening, lipid analysis with projected  10 year  risk for CAD , nutrition counseling, breast, cervical and colorectal cancer screening, and recommended immunizations.  Printed recommendations for health maintenance screenings was given      High cholesterol    10 yr is 12.6 % based on last  lipid profile. She prefers to avoid statins but has a strong FH .  Wt loss and Meditereranean low GI diet recommended.    Lab Results  Component Value Date   CHOL 192 08/13/2014   HDL 38.80 (L) 08/13/2014   LDLCALC 120 (H) 08/13/2014   LDLDIRECT 171.3 05/23/2013   TRIG 164.0 (H) 08/13/2014   CHOLHDL 5 08/13/2014        Obesity    I have addressed  BMI and recommended a low glycemic index diet utilizing smaller more frequent meals to increase metabolism.  I have also recommended that patient start exercising with a goal of 30 minutes of aerobic exercise a minimum of 5 days per week. Screening for lipid disorders, thyroid and diabetes to be done today.         Other Visit Diagnoses    Colon cancer screening    -  Primary   Relevant Orders   Ambulatory referral to Gastroenterology   Breast cancer screening, high risk patient       Relevant Orders   MM DIGITAL SCREENING BILATERAL    Cervical cancer screening       Relevant Orders   Cytology - PAP   B12 deficiency       Relevant Orders   Vitamin B12      I have discontinued Ms. Breuer's cyanocobalamin, NUCYNTA, UROGESIC-BLUE, hyoscyamine, and docusate sodium. I am also having her maintain her levofloxacin and Vitamin D (Ergocalciferol).  No orders of the defined types were placed in this encounter.   Medications Discontinued During This Encounter  Medication Reason  . NUCYNTA 50 MG TABS tablet Patient has not taken in last 30 days  . Methen-Hyosc-Meth Ree Kida  Phos (UROGESIC-BLUE) 81.6 MG TABS Patient has not taken in last 30 days  . hyoscyamine (LEVSIN/SL) 0.125 MG SL tablet Patient has not taken in last 30 days  . docusate sodium (COLACE) 100 MG capsule Patient has not taken in last 30 days  . cyanocobalamin (,VITAMIN B-12,) 1000 MCG/ML injection Patient has not taken in last 30 days    Follow-up: No Follow-up on file.   Crecencio Mc, MD

## 2016-05-29 NOTE — Patient Instructions (Signed)
Your mammogram and referral to GI are in process  Please return for fasting labs soon    Health Maintenance for Postmenopausal Women Introduction Menopause is a normal process in which your reproductive ability comes to an end. This process happens gradually over a span of months to years, usually between the ages of 42 and 53. Menopause is complete when you have missed 12 consecutive menstrual periods. It is important to talk with your health care provider about some of the most common conditions that affect postmenopausal women, such as heart disease, cancer, and bone loss (osteoporosis). Adopting a healthy lifestyle and getting preventive care can help to promote your health and wellness. Those actions can also lower your chances of developing some of these common conditions. What should I know about menopause? During menopause, you may experience a number of symptoms, such as:  Moderate-to-severe hot flashes.  Night sweats.  Decrease in sex drive.  Mood swings.  Headaches.  Tiredness.  Irritability.  Memory problems.  Insomnia. Choosing to treat or not to treat menopausal changes is an individual decision that you make with your health care provider. What should I know about hormone replacement therapy and supplements? Hormone therapy products are effective for treating symptoms that are associated with menopause, such as hot flashes and night sweats. Hormone replacement carries certain risks, especially as you become older. If you are thinking about using estrogen or estrogen with progestin treatments, discuss the benefits and risks with your health care provider. What should I know about heart disease and stroke? Heart disease, heart attack, and stroke become more likely as you age. This may be due, in part, to the hormonal changes that your body experiences during menopause. These can affect how your body processes dietary fats, triglycerides, and cholesterol. Heart attack and  stroke are both medical emergencies. There are many things that you can do to help prevent heart disease and stroke:  Have your blood pressure checked at least every 1-2 years. High blood pressure causes heart disease and increases the risk of stroke.  If you are 68-78 years old, ask your health care provider if you should take aspirin to prevent a heart attack or a stroke.  Do not use any tobacco products, including cigarettes, chewing tobacco, or electronic cigarettes. If you need help quitting, ask your health care provider.  It is important to eat a healthy diet and maintain a healthy weight.  Be sure to include plenty of vegetables, fruits, low-fat dairy products, and lean protein.  Avoid eating foods that are high in solid fats, added sugars, or salt (sodium).  Get regular exercise. This is one of the most important things that you can do for your health.  Try to exercise for at least 150 minutes each week. The type of exercise that you do should increase your heart rate and make you sweat. This is known as moderate-intensity exercise.  Try to do strengthening exercises at least twice each week. Do these in addition to the moderate-intensity exercise.  Know your numbers.Ask your health care provider to check your cholesterol and your blood glucose. Continue to have your blood tested as directed by your health care provider. What should I know about cancer screening? There are several types of cancer. Take the following steps to reduce your risk and to catch any cancer development as early as possible. Breast Cancer  Practice breast self-awareness.  This means understanding how your breasts normally appear and feel.  It also means doing regular breast self-exams.  Let your health care provider know about any changes, no matter how small.  If you are 30 or older, have a clinician do a breast exam (clinical breast exam or CBE) every year. Depending on your age, family history, and  medical history, it may be recommended that you also have a yearly breast X-ray (mammogram).  If you have a family history of breast cancer, talk with your health care provider about genetic screening.  If you are at high risk for breast cancer, talk with your health care provider about having an MRI and a mammogram every year.  Breast cancer (BRCA) gene test is recommended for women who have family members with BRCA-related cancers. Results of the assessment will determine the need for genetic counseling and BRCA1 and for BRCA2 testing. BRCA-related cancers include these types:  Breast. This occurs in males or females.  Ovarian.  Tubal. This may also be called fallopian tube cancer.  Cancer of the abdominal or pelvic lining (peritoneal cancer).  Prostate.  Pancreatic. Cervical, Uterine, and Ovarian Cancer  Your health care provider may recommend that you be screened regularly for cancer of the pelvic organs. These include your ovaries, uterus, and vagina. This screening involves a pelvic exam, which includes checking for microscopic changes to the surface of your cervix (Pap test).  For women ages 21-65, health care providers may recommend a pelvic exam and a Pap test every three years. For women ages 102-65, they may recommend the Pap test and pelvic exam, combined with testing for human papilloma virus (HPV), every five years. Some types of HPV increase your risk of cervical cancer. Testing for HPV may also be done on women of any age who have unclear Pap test results.  Other health care providers may not recommend any screening for nonpregnant women who are considered low risk for pelvic cancer and have no symptoms. Ask your health care provider if a screening pelvic exam is right for you.  If you have had past treatment for cervical cancer or a condition that could lead to cancer, you need Pap tests and screening for cancer for at least 20 years after your treatment. If Pap tests have  been discontinued for you, your risk factors (such as having a new sexual partner) need to be reassessed to determine if you should start having screenings again. Some women have medical problems that increase the chance of getting cervical cancer. In these cases, your health care provider may recommend that you have screening and Pap tests more often.  If you have a family history of uterine cancer or ovarian cancer, talk with your health care provider about genetic screening.  If you have vaginal bleeding after reaching menopause, tell your health care provider.  There are currently no reliable tests available to screen for ovarian cancer. Lung Cancer  Lung cancer screening is recommended for adults 68-65 years old who are at high risk for lung cancer because of a history of smoking. A yearly low-dose CT scan of the lungs is recommended if you:  Currently smoke.  Have a history of at least 30 pack-years of smoking and you currently smoke or have quit within the past 15 years. A pack-year is smoking an average of one pack of cigarettes per day for one year. Yearly screening should:  Continue until it has been 15 years since you quit.  Stop if you develop a health problem that would prevent you from having lung cancer treatment. Colorectal Cancer  This type of cancer  can be detected and can often be prevented.  Routine colorectal cancer screening usually begins at age 105 and continues through age 61.  If you have risk factors for colon cancer, your health care provider may recommend that you be screened at an earlier age.  If you have a family history of colorectal cancer, talk with your health care provider about genetic screening.  Your health care provider may also recommend using home test kits to check for hidden blood in your stool.  A small camera at the end of a tube can be used to examine your colon directly (sigmoidoscopy or colonoscopy). This is done to check for the earliest  forms of colorectal cancer.  Direct examination of the colon should be repeated every 5-10 years until age 26. However, if early forms of precancerous polyps or small growths are found or if you have a family history or genetic risk for colorectal cancer, you may need to be screened more often. Skin Cancer  Check your skin from head to toe regularly.  Monitor any moles. Be sure to tell your health care provider:  About any new moles or changes in moles, especially if there is a change in a mole's shape or color.  If you have a mole that is larger than the size of a pencil eraser.  If any of your family members has a history of skin cancer, especially at a young age, talk with your health care provider about genetic screening.  Always use sunscreen. Apply sunscreen liberally and repeatedly throughout the day.  Whenever you are outside, protect yourself by wearing long sleeves, pants, a wide-brimmed hat, and sunglasses. What should I know about osteoporosis? Osteoporosis is a condition in which bone destruction happens more quickly than new bone creation. After menopause, you may be at an increased risk for osteoporosis. To help prevent osteoporosis or the bone fractures that can happen because of osteoporosis, the following is recommended:  If you are 19-49 years old, get at least 1,000 mg of calcium and at least 600 mg of vitamin D per day.  If you are older than age 22 but younger than age 45, get at least 1,200 mg of calcium and at least 600 mg of vitamin D per day.  If you are older than age 58, get at least 1,200 mg of calcium and at least 800 mg of vitamin D per day. Smoking and excessive alcohol intake increase the risk of osteoporosis. Eat foods that are rich in calcium and vitamin D, and do weight-bearing exercises several times each week as directed by your health care provider. What should I know about how menopause affects my mental health? Depression may occur at any age, but  it is more common as you become older. Common symptoms of depression include:  Low or sad mood.  Changes in sleep patterns.  Changes in appetite or eating patterns.  Feeling an overall lack of motivation or enjoyment of activities that you previously enjoyed.  Frequent crying spells. Talk with your health care provider if you think that you are experiencing depression. What should I know about immunizations? It is important that you get and maintain your immunizations. These include:  Tetanus, diphtheria, and pertussis (Tdap) booster vaccine.  Influenza every year before the flu season begins.  Pneumonia vaccine.  Shingles vaccine. Your health care provider may also recommend other immunizations. This information is not intended to replace advice given to you by your health care provider. Make sure you discuss any  questions you have with your health care provider. Document Released: 06/26/2005 Document Revised: 11/22/2015 Document Reviewed: 02/05/2015  2017 [employer]vier

## 2016-05-29 NOTE — Progress Notes (Signed)
Pre visit review using our clinic review tool, if applicable. No additional management support is needed unless otherwise documented below in the visit note. 

## 2016-05-31 ENCOUNTER — Encounter: Payer: Self-pay | Admitting: Internal Medicine

## 2016-05-31 DIAGNOSIS — Z8551 Personal history of malignant neoplasm of bladder: Secondary | ICD-10-CM | POA: Insufficient documentation

## 2016-05-31 DIAGNOSIS — C679 Malignant neoplasm of bladder, unspecified: Secondary | ICD-10-CM

## 2016-05-31 NOTE — Assessment & Plan Note (Signed)
Managed with monthly IM injections.    

## 2016-05-31 NOTE — Assessment & Plan Note (Signed)
Found during workup for hematuria in the setting of infection. She was  diagnosed and treated within two weeks, s/p TURB, mitomycin irrigation and urethral dilation  March 2017 Rogers Blocker)

## 2016-05-31 NOTE — Assessment & Plan Note (Signed)
10 yr is 12.6 % based on last  lipid profile. She prefers to avoid statins but has a strong FH .  Wt loss and Meditereranean low GI diet recommended.    Lab Results  Component Value Date   CHOL 192 08/13/2014   HDL 38.80 (L) 08/13/2014   LDLCALC 120 (H) 08/13/2014   LDLDIRECT 171.3 05/23/2013   TRIG 164.0 (H) 08/13/2014   CHOLHDL 5 08/13/2014

## 2016-05-31 NOTE — Assessment & Plan Note (Signed)
Annual comprehensive preventive exam was done as well as an evaluation and management of chronic conditions .  During the course of the visit the patient was educated and counseled about appropriate screening and preventive services including :  diabetes screening, lipid analysis with projected  10 year  risk for CAD , nutrition counseling, breast, cervical and colorectal cancer screening, and recommended immunizations.  Printed recommendations for health maintenance screenings was given 

## 2016-05-31 NOTE — Assessment & Plan Note (Signed)
I have addressed  BMI and recommended a low glycemic index diet utilizing smaller more frequent meals to increase metabolism.  I have also recommended that patient start exercising with a goal of 30 minutes of aerobic exercise a minimum of 5 days per week. Screening for lipid disorders, thyroid and diabetes to be done today.   

## 2016-06-02 LAB — CYTOLOGY - PAP
Diagnosis: NEGATIVE
HPV: NOT DETECTED

## 2016-06-03 ENCOUNTER — Other Ambulatory Visit: Payer: BLUE CROSS/BLUE SHIELD

## 2016-06-03 ENCOUNTER — Encounter: Payer: Self-pay | Admitting: Internal Medicine

## 2016-06-05 ENCOUNTER — Other Ambulatory Visit (INDEPENDENT_AMBULATORY_CARE_PROVIDER_SITE_OTHER): Payer: BLUE CROSS/BLUE SHIELD

## 2016-06-05 DIAGNOSIS — E559 Vitamin D deficiency, unspecified: Secondary | ICD-10-CM

## 2016-06-05 DIAGNOSIS — E538 Deficiency of other specified B group vitamins: Secondary | ICD-10-CM | POA: Diagnosis not present

## 2016-06-05 DIAGNOSIS — R5383 Other fatigue: Secondary | ICD-10-CM | POA: Diagnosis not present

## 2016-06-05 DIAGNOSIS — E785 Hyperlipidemia, unspecified: Secondary | ICD-10-CM

## 2016-06-05 LAB — CBC WITH DIFFERENTIAL/PLATELET
Basophils Absolute: 0 10*3/uL (ref 0.0–0.1)
Basophils Relative: 0.4 % (ref 0.0–3.0)
Eosinophils Absolute: 0.1 10*3/uL (ref 0.0–0.7)
Eosinophils Relative: 2.9 % (ref 0.0–5.0)
HCT: 37.8 % (ref 36.0–46.0)
Hemoglobin: 13.1 g/dL (ref 12.0–15.0)
Lymphocytes Relative: 34.3 % (ref 12.0–46.0)
Lymphs Abs: 1.6 10*3/uL (ref 0.7–4.0)
MCHC: 34.6 g/dL (ref 30.0–36.0)
MCV: 85 fl (ref 78.0–100.0)
Monocytes Absolute: 0.3 10*3/uL (ref 0.1–1.0)
Monocytes Relative: 6.7 % (ref 3.0–12.0)
Neutro Abs: 2.6 10*3/uL (ref 1.4–7.7)
Neutrophils Relative %: 55.7 % (ref 43.0–77.0)
Platelets: 221 10*3/uL (ref 150.0–400.0)
RBC: 4.44 Mil/uL (ref 3.87–5.11)
RDW: 13.8 % (ref 11.5–15.5)
WBC: 4.6 10*3/uL (ref 4.0–10.5)

## 2016-06-05 LAB — LIPID PANEL
Cholesterol: 217 mg/dL — ABNORMAL HIGH (ref 0–200)
HDL: 43.5 mg/dL (ref >39.00)
LDL Cholesterol: 141 mg/dL — ABNORMAL HIGH (ref 0–99)
NonHDL: 173.29
Total CHOL/HDL Ratio: 5
Triglycerides: 161 mg/dL — ABNORMAL HIGH (ref 0.0–149.0)
VLDL: 32.2 mg/dL (ref 0.0–40.0)

## 2016-06-05 LAB — COMPREHENSIVE METABOLIC PANEL
ALT: 13 U/L (ref 0–35)
AST: 12 U/L (ref 0–37)
Albumin: 4 g/dL (ref 3.5–5.2)
Alkaline Phosphatase: 87 U/L (ref 39–117)
BUN: 14 mg/dL (ref 6–23)
CO2: 26 mEq/L (ref 19–32)
Calcium: 9 mg/dL (ref 8.4–10.5)
Chloride: 106 mEq/L (ref 96–112)
Creatinine, Ser: 0.87 mg/dL (ref 0.40–1.20)
GFR: 69.49 mL/min (ref >60.00)
Glucose, Bld: 105 mg/dL — ABNORMAL HIGH (ref 70–99)
Potassium: 4.2 mEq/L (ref 3.5–5.1)
Sodium: 141 mEq/L (ref 135–145)
Total Bilirubin: 0.6 mg/dL (ref 0.2–1.2)
Total Protein: 6.6 g/dL (ref 6.0–8.3)

## 2016-06-05 LAB — VITAMIN D 25 HYDROXY (VIT D DEFICIENCY, FRACTURES): VITD: 21.08 ng/mL — ABNORMAL LOW (ref 30.00–100.00)

## 2016-06-05 LAB — VITAMIN B12: Vitamin B-12: 167 pg/mL — ABNORMAL LOW (ref 211–911)

## 2016-06-05 LAB — TSH: TSH: 2.42 u[IU]/mL (ref 0.35–4.50)

## 2016-06-06 LAB — HIV ANTIBODY (ROUTINE TESTING W REFLEX): HIV 1&2 Ab, 4th Generation: NONREACTIVE

## 2016-06-06 LAB — HEPATITIS C ANTIBODY: HCV Ab: NEGATIVE

## 2016-06-08 ENCOUNTER — Other Ambulatory Visit: Payer: Self-pay | Admitting: Internal Medicine

## 2016-06-08 ENCOUNTER — Encounter: Payer: Self-pay | Admitting: Internal Medicine

## 2016-06-08 MED ORDER — VITAMIN D (ERGOCALCIFEROL) 1.25 MG (50000 UNIT) PO CAPS: ORAL_CAPSULE | ORAL | 3 refills | Status: DC

## 2016-06-08 MED ORDER — CYANOCOBALAMIN 1000 MCG/ML IJ SOLN: 1000.0000 ug | INTRAMUSCULAR | 3 refills | Status: DC

## 2016-07-13 ENCOUNTER — Telehealth: Payer: Self-pay | Admitting: *Deleted

## 2016-07-13 NOTE — Telephone Encounter (Signed)
Pt was charged for her HIV and Hep C testing, insurance stated that a medical diagnosis was attached to the visit. Pt stated that she did not discuss having HIV or Hep C drawn . Pt requested call.  Pt contact [phone]

## 2016-07-20 ENCOUNTER — Encounter: Payer: Self-pay | Admitting: Gastroenterology

## 2016-08-31 DIAGNOSIS — Z8551 Personal history of malignant neoplasm of bladder: Secondary | ICD-10-CM | POA: Diagnosis not present

## 2016-09-03 DIAGNOSIS — C172 Malignant neoplasm of ileum: Secondary | ICD-10-CM | POA: Diagnosis not present

## 2016-09-04 ENCOUNTER — Ambulatory Visit (AMBULATORY_SURGERY_CENTER): Payer: Self-pay | Admitting: *Deleted

## 2016-09-04 VITALS — Ht 67.0 in | Wt 229.8 lb

## 2016-09-04 DIAGNOSIS — Z86010 Personal history of colonic polyps: Secondary | ICD-10-CM

## 2016-09-04 NOTE — Progress Notes (Signed)
Pt denies allergies to eggs or soy products. Denies difficulty with sedation or anesthesia. Denies any diet or weight loss medications. Denies use of supplemental oxygen.  Emmi instructions given for procedure.  

## 2016-09-07 MED ORDER — NA SULFATE-K SULFATE-MG SULF 17.5-3.13-1.6 GM/177ML PO SOLN: ORAL | 0 refills | Status: DC

## 2016-09-08 DIAGNOSIS — Z8551 Personal history of malignant neoplasm of bladder: Secondary | ICD-10-CM | POA: Diagnosis not present

## 2016-09-18 ENCOUNTER — Encounter: Payer: Self-pay | Admitting: Gastroenterology

## 2016-09-18 ENCOUNTER — Ambulatory Visit (AMBULATORY_SURGERY_CENTER): Payer: Medicare Other | Admitting: Gastroenterology

## 2016-09-18 VITALS — BP 147/85 | HR 68 | Temp 97.7°F | Resp 14 | Ht 67.0 in | Wt 229.0 lb

## 2016-09-18 DIAGNOSIS — Z86010 Personal history of colonic polyps: Secondary | ICD-10-CM

## 2016-09-18 DIAGNOSIS — K5730 Diverticulosis of large intestine without perforation or abscess without bleeding: Secondary | ICD-10-CM | POA: Diagnosis not present

## 2016-09-18 MED ORDER — SODIUM CHLORIDE 0.9 % IV SOLN: 500.0000 mL | INTRAVENOUS | Status: DC

## 2016-09-18 NOTE — Patient Instructions (Signed)
Handouts given on diverticulosis and Hemorrhoids   YOU HAD AN ENDOSCOPIC PROCEDURE TODAY: Refer to the procedure report and other information in the discharge instructions given to you for any specific questions about what was found during the examination. If this information does not answer your questions, please call [place] office at [phone] to clarify.   YOU SHOULD EXPECT: Some feelings of bloating in the abdomen. Passage of more gas than usual. Walking can help get rid of the air that was put into your GI tract during the procedure and reduce the bloating. If you had a lower endoscopy (such as a colonoscopy or flexible sigmoidoscopy) you may notice spotting of blood in your stool or on the toilet paper. Some abdominal soreness may be present for a day or two, also.  DIET: Your first meal following the procedure should be a light meal and then it is ok to progress to your normal diet. A half-sandwich or bowl of soup is an example of a good first meal. Heavy or fried foods are harder to digest and may make you feel nauseous or bloated. Drink plenty of fluids but you should avoid alcoholic beverages for 24 hours. If you had a esophageal dilation, please see attached instructions for diet.    ACTIVITY: Your care partner should take you home directly after the procedure. You should plan to take it easy, moving slowly for the rest of the day. You can resume normal activity the day after the procedure however YOU SHOULD NOT DRIVE, use power tools, machinery or perform tasks that involve climbing or major physical exertion for 24 hours (because of the sedation medicines used during the test).   SYMPTOMS TO REPORT IMMEDIATELY: A gastroenterologist can be reached at any hour. Please call [phone]  for any of the following symptoms:  Following lower endoscopy (colonoscopy, flexible sigmoidoscopy) Excessive amounts of blood in the stool  Significant tenderness, worsening of abdominal pains   Swelling of the abdomen that is new, acute  Fever of 100 or higher    FOLLOW UP:  If any biopsies were taken you will be contacted by phone or by letter within the next 1-3 weeks. Call [phone]  if you have not heard about the biopsies in 3 weeks.  Please also call with any specific questions about appointments or follow up tests.

## 2016-09-18 NOTE — Progress Notes (Signed)
Right at end of procedure pt noted to be excessively chewing and swallowing.  Immediate suctioning of oropharynx yield mainly saliva/mucous.  Pt continued.  Eventually two big burst of bile colored vomit. Pt head immediately dropped into steep t burg  About 100cc into suction maybe at least that into bed.  Pt was already strating to come around since we were finishing up.  Pt instructed to swallow and clear oropharnyx with aid of suctioning.  PACU nurse Megan made aware   VSS and breath sounds clear in immediate pacu

## 2016-09-18 NOTE — Op Note (Signed)
[facility] Patient Name: Jo White Procedure Date: 09/18/2016 10:21 AM MRN: [id number] Endoscopist: Milus Banister , MD Age: 66 Referring MD:  Date of Birth: 01/05/1951 Gender: Female Account #: [id number] Procedure:                Colonoscopy Indications:              High risk colon cancer surveillance: Personal                            history of colonic polyps; colonoscopy 2012 Dr.                            Ardis Hughs one subCM adenoma; colonoscopy 2002                            elsewhere with hyperplastic polyp Medicines:                Monitored Anesthesia Care Procedure:                Pre-Anesthesia Assessment:                           - Prior to the procedure, a History and Physical                            was performed, and patient medications and                            allergies were reviewed. The patient's tolerance of                            previous anesthesia was also reviewed. The risks                            and benefits of the procedure and the sedation                            options and risks were discussed with the patient.                            All questions were answered, and informed consent                            was obtained. Prior Anticoagulants: The patient has                            taken no previous anticoagulant or antiplatelet                            agents. ASA Grade Assessment: II - A patient with                            mild systemic disease. After reviewing the risks  and benefits, the patient was deemed in                            satisfactory condition to undergo the procedure.                           After obtaining informed consent, the colonoscope                            was passed under direct vision. Throughout the                            procedure, the patient's blood pressure, pulse, and                            oxygen saturations were monitored  continuously. The                            Colonoscope was introduced through the anus and                            advanced to the the cecum, identified by                            appendiceal orifice and ileocecal valve. The                            colonoscopy was performed without difficulty. The                            patient tolerated the procedure well. The quality                            of the bowel preparation was adequate to identify                            polyps. The ileocecal valve, appendiceal orifice,                            and rectum were photographed. Scope In: 10:27:43 AM Scope Out: 10:41:59 AM Scope Withdrawal Time: 0 hours 8 minutes 20 seconds  Total Procedure Duration: 0 hours 14 minutes 16 seconds  Findings:                 Multiple small and large-mouthed diverticula were                            found in the left colon.                           External hemorrhoids were found. The hemorrhoids                            were small.  The exam was otherwise without abnormality on                            direct and retroflexion views. Complications:            No immediate complications. Estimated blood loss:                            None. Estimated Blood Loss:     Estimated blood loss: none. Impression:               - Diverticulosis in the left colon.                           - External hemorrhoids.                           - The examination was otherwise normal on direct                            and retroflexion views.                           - No specimens collected. Recommendation:           - Patient has a contact number available for                            emergencies. The signs and symptoms of potential                            delayed complications were discussed with the                            patient. Return to normal activities tomorrow.                            Written discharge  instructions were provided to the                            patient.                           - Resume previous diet.                           - Continue present medications.                           - Repeat colonoscopy in 10 years for screening                            purposes. Milus Banister, MD 09/18/2016 10:45:08 AM This report has been signed electronically.

## 2016-09-21 ENCOUNTER — Telehealth: Payer: Self-pay

## 2016-09-21 NOTE — Telephone Encounter (Signed)
  Follow up Call-  Call back number 09/18/2016  Post procedure Call Back phone  # [phone]  Permission to leave phone message Yes  Some recent data might be hidden     Patient questions:  Do you have a fever, pain , or abdominal swelling? No. Pain Score  0 *  Have you tolerated food without any problems? Yes.    Have you been able to return to your normal activities? Yes.    Do you have any questions about your discharge instructions: Diet   No. Medications  No. Follow up visit  No.  Do you have questions or concerns about your Care? No.  Actions: * If pain score is 4 or above: No action needed, pain <4.

## 2016-10-01 DIAGNOSIS — Z1231 Encounter for screening mammogram for malignant neoplasm of breast: Secondary | ICD-10-CM | POA: Diagnosis not present

## 2016-10-01 LAB — HM MAMMOGRAPHY

## 2016-10-05 ENCOUNTER — Telehealth: Payer: Self-pay | Admitting: Internal Medicine

## 2017-01-04 NOTE — Telephone Encounter (Signed)
Bill paid.

## 2017-02-10 NOTE — Telephone Encounter (Signed)
Order

## 2017-02-22 DIAGNOSIS — Z23 Encounter for immunization: Secondary | ICD-10-CM | POA: Diagnosis not present

## 2017-03-01 DIAGNOSIS — N393 Stress incontinence (female) (male): Secondary | ICD-10-CM | POA: Diagnosis not present

## 2017-03-01 DIAGNOSIS — Z8551 Personal history of malignant neoplasm of bladder: Secondary | ICD-10-CM | POA: Diagnosis not present

## 2017-05-12 ENCOUNTER — Ambulatory Visit: Payer: Self-pay

## 2017-05-12 NOTE — Telephone Encounter (Signed)
Patient called in with c/o "having episode in the middle of the night of unable to breathe, wheezing." Patient reported "I cough some at night and the coughing has been there x 1 week, nothing coming up, just a dry cough. I woke up with difficulty breathing, I couldn't catch my breath. I turned on my shower and inhaled the steam, then I went outside in the cold for a few minutes. I felt better and sit down in the chair. I dozed off and had 2 more mild episodes of the same thing, but it passed after about 2 minutes. I was scared and decided I needed to call, because I didn't want this to happen again tonight." At present, patient reports "my breathing is fine and my cough is not bad." Denies having a fever today or any time during the past week.  She does report having some mild sinus drainage from time to time, but not severe. She denies taking any medication during this week for cold symptoms. According to the protocol, see PCP within 3 days.  Her PCP practice has no available appointments, patient agrees to go to another practice and requested [facility].  Appointment made for 05/13/17 with Dr. Lorelei Pont, care advice given, patient verbalized understanding.  Reason for Disposition . [1] Patient also has allergy symptoms (e.g., itchy eyes, clear nasal discharge, postnasal drip) AND [2] they are acting up  Answer Assessment - Initial Assessment Questions 1. ONSET: "When did the cough begin?"      1 week 2. SEVERITY: "How bad is the cough today?"      Not that bad right now 3. RESPIRATORY DISTRESS: "Describe your breathing."      Breathing fine right now. Had episode of difficulty breathing last night 4. FEVER: "Do you have a fever?" If so, ask: "What is your temperature, how was it measured, and when did it start?"     No fever now or during the week of the cough, not feeling bad at all 5. HEMOPTYSIS: "Are you coughing up any blood?" If so ask: "How much?" (flecks, streaks, tablespoons, etc.)  Denies 6. TREATMENT: "What have you done so far to treat the cough?" (e.g., meds, fluids, humidifier)     Nothing except fluids 7. CARDIAC HISTORY: "Do you have any history of heart disease?" (e.g., heart attack, congestive heart failure)      Denies 8. LUNG HISTORY: "Do you have any history of lung disease?"  (e.g., pulmonary embolus, asthma, emphysema)     Denies 9. PE RISK FACTORS: "Do you have a history of blood clots?" (or: recent major surgery, recent prolonged travel, bedridden )     Denise 10. OTHER SYMPTOMS: "Do you have any other symptoms? (e.g., runny nose, wheezing, chest pain)       Very minimal sinus drainage, wheezing was last night like my airway was closed 11. PREGNANCY: "Is there any chance you are pregnant?" "When was your last menstrual period?"       No 12. TRAVEL: "Have you traveled out of the country in the last month?" (e.g., travel history, exposures)       No  Protocols used: COUGH - ACUTE NON-PRODUCTIVE-A-AH

## 2017-05-13 ENCOUNTER — Ambulatory Visit: Payer: Medicare Other | Admitting: Family Medicine

## 2017-08-30 DIAGNOSIS — Z8551 Personal history of malignant neoplasm of bladder: Secondary | ICD-10-CM | POA: Diagnosis not present

## 2017-08-30 DIAGNOSIS — N393 Stress incontinence (female) (male): Secondary | ICD-10-CM | POA: Diagnosis not present

## 2017-10-21 ENCOUNTER — Telehealth: Payer: Self-pay | Admitting: Family Medicine

## 2017-10-21 NOTE — Telephone Encounter (Signed)
Patient called in to request an appt (no availability) she is requesting medication for possible sinus infection. Symptoms include: Cough Rattling in chest Weakness Thick greenish brown mucus  Wheezing at night  Pharmacy is [facility]  Patient cb#: [phone]

## 2017-10-21 NOTE — Telephone Encounter (Signed)
Is this the correct patient?

## 2017-10-21 NOTE — Telephone Encounter (Signed)
PLEASE ADVISE WRONG PATIENT CHART

## 2017-11-30 DIAGNOSIS — Z1231 Encounter for screening mammogram for malignant neoplasm of breast: Secondary | ICD-10-CM | POA: Diagnosis not present

## 2017-11-30 LAB — HM MAMMOGRAPHY

## 2017-12-06 DIAGNOSIS — L820 Inflamed seborrheic keratosis: Secondary | ICD-10-CM | POA: Diagnosis not present

## 2017-12-06 DIAGNOSIS — D225 Melanocytic nevi of trunk: Secondary | ICD-10-CM | POA: Diagnosis not present

## 2017-12-06 DIAGNOSIS — C44722 Squamous cell carcinoma of skin of right lower limb, including hip: Secondary | ICD-10-CM | POA: Diagnosis not present

## 2017-12-06 DIAGNOSIS — Z1283 Encounter for screening for malignant neoplasm of skin: Secondary | ICD-10-CM | POA: Diagnosis not present

## 2017-12-06 DIAGNOSIS — C44729 Squamous cell carcinoma of skin of left lower limb, including hip: Secondary | ICD-10-CM | POA: Diagnosis not present

## 2017-12-06 DIAGNOSIS — D1801 Hemangioma of skin and subcutaneous tissue: Secondary | ICD-10-CM | POA: Diagnosis not present

## 2017-12-06 DIAGNOSIS — L821 Other seborrheic keratosis: Secondary | ICD-10-CM | POA: Diagnosis not present

## 2017-12-06 DIAGNOSIS — D229 Melanocytic nevi, unspecified: Secondary | ICD-10-CM | POA: Diagnosis not present

## 2017-12-06 DIAGNOSIS — L720 Epidermal cyst: Secondary | ICD-10-CM | POA: Diagnosis not present

## 2017-12-06 DIAGNOSIS — D2230 Melanocytic nevi of unspecified part of face: Secondary | ICD-10-CM | POA: Diagnosis not present

## 2017-12-06 DIAGNOSIS — L812 Freckles: Secondary | ICD-10-CM | POA: Diagnosis not present

## 2017-12-06 DIAGNOSIS — C4492 Squamous cell carcinoma of skin, unspecified: Secondary | ICD-10-CM

## 2017-12-06 DIAGNOSIS — L578 Other skin changes due to chronic exposure to nonionizing radiation: Secondary | ICD-10-CM | POA: Diagnosis not present

## 2017-12-06 HISTORY — DX: Squamous cell carcinoma of skin, unspecified: C44.92

## 2017-12-07 ENCOUNTER — Ambulatory Visit (INDEPENDENT_AMBULATORY_CARE_PROVIDER_SITE_OTHER): Payer: Medicare Other | Admitting: Internal Medicine

## 2017-12-07 ENCOUNTER — Encounter: Payer: Self-pay | Admitting: Internal Medicine

## 2017-12-07 VITALS — BP 142/82 | HR 70 | Temp 98.1°F | Resp 15 | Ht 67.0 in | Wt 226.0 lb

## 2017-12-07 DIAGNOSIS — G8929 Other chronic pain: Secondary | ICD-10-CM | POA: Diagnosis not present

## 2017-12-07 DIAGNOSIS — M79673 Pain in unspecified foot: Secondary | ICD-10-CM | POA: Diagnosis not present

## 2017-12-07 DIAGNOSIS — R5383 Other fatigue: Secondary | ICD-10-CM | POA: Diagnosis not present

## 2017-12-07 DIAGNOSIS — C679 Malignant neoplasm of bladder, unspecified: Secondary | ICD-10-CM | POA: Diagnosis not present

## 2017-12-07 DIAGNOSIS — E559 Vitamin D deficiency, unspecified: Secondary | ICD-10-CM

## 2017-12-07 DIAGNOSIS — Z1239 Encounter for other screening for malignant neoplasm of breast: Secondary | ICD-10-CM

## 2017-12-07 DIAGNOSIS — E785 Hyperlipidemia, unspecified: Secondary | ICD-10-CM

## 2017-12-07 DIAGNOSIS — Z1231 Encounter for screening mammogram for malignant neoplasm of breast: Secondary | ICD-10-CM

## 2017-12-07 MED ORDER — ZOSTER VAC RECOMB ADJUVANTED 50 MCG/0.5ML IM SUSR: 0.5000 mL | Freq: Once | INTRAMUSCULAR | 1 refills | Status: AC

## 2017-12-07 NOTE — Assessment & Plan Note (Signed)
No recurrence by follow up since 2017.  Now on annual check ups with Dr Rogers Blocker.

## 2017-12-07 NOTE — Patient Instructions (Signed)
The ShingRx vaccine is now available in local pharmacies and is much more protective thant Zostavaxs,  It is therefore ADVISED for all interested adults over 50 to prevent shingles   Please make an appointment for fasting labs.  (5 hours of fasting is PLENTY)   Health Maintenance for Postmenopausal Women Menopause is a normal process in which your reproductive ability comes to an end. This process happens gradually over a span of months to years, usually between the ages of 27 and 40. Menopause is complete when you have missed 12 consecutive menstrual periods. It is important to talk with your health care provider about some of the most common conditions that affect postmenopausal women, such as heart disease, cancer, and bone loss (osteoporosis). Adopting a healthy lifestyle and getting preventive care can help to promote your health and wellness. Those actions can also lower your chances of developing some of these common conditions. What should I know about menopause? During menopause, you may experience a number of symptoms, such as:  Moderate-to-severe hot flashes.  Night sweats.  Decrease in sex drive.  Mood swings.  Headaches.  Tiredness.  Irritability.  Memory problems.  Insomnia.  Choosing to treat or not to treat menopausal changes is an individual decision that you make with your health care provider. What should I know about hormone replacement therapy and supplements? Hormone therapy products are effective for treating symptoms that are associated with menopause, such as hot flashes and night sweats. Hormone replacement carries certain risks, especially as you become older. If you are thinking about using estrogen or estrogen with progestin treatments, discuss the benefits and risks with your health care provider. What should I know about heart disease and stroke? Heart disease, heart attack, and stroke become more likely as you age. This may be due, in part, to the  hormonal changes that your body experiences during menopause. These can affect how your body processes dietary fats, triglycerides, and cholesterol. Heart attack and stroke are both medical emergencies. There are many things that you can do to help prevent heart disease and stroke:  Have your blood pressure checked at least every 1-2 years. High blood pressure causes heart disease and increases the risk of stroke.  If you are 61-6 years old, ask your health care provider if you should take aspirin to prevent a heart attack or a stroke.  Do not use any tobacco products, including cigarettes, chewing tobacco, or electronic cigarettes. If you need help quitting, ask your health care provider.  It is important to eat a healthy diet and maintain a healthy weight. ? Be sure to include plenty of vegetables, fruits, low-fat dairy products, and lean protein. ? Avoid eating foods that are high in solid fats, added sugars, or salt (sodium).  Get regular exercise. This is one of the most important things that you can do for your health. ? Try to exercise for at least 150 minutes each week. The type of exercise that you do should increase your heart rate and make you sweat. This is known as moderate-intensity exercise. ? Try to do strengthening exercises at least twice each week. Do these in addition to the moderate-intensity exercise.  Know your numbers.Ask your health care provider to check your cholesterol and your blood glucose. Continue to have your blood tested as directed by your health care provider.  What should I know about cancer screening? There are several types of cancer. Take the following steps to reduce your risk and to catch any cancer  development as early as possible. Breast Cancer  Practice breast self-awareness. ? This means understanding how your breasts normally appear and feel. ? It also means doing regular breast self-exams. Let your health care provider know about any changes,  no matter how small.  If you are 41 or older, have a clinician do a breast exam (clinical breast exam or CBE) every year. Depending on your age, family history, and medical history, it may be recommended that you also have a yearly breast X-ray (mammogram).  If you have a family history of breast cancer, talk with your health care provider about genetic screening.  If you are at high risk for breast cancer, talk with your health care provider about having an MRI and a mammogram every year.  Breast cancer (BRCA) gene test is recommended for women who have family members with BRCA-related cancers. Results of the assessment will determine the need for genetic counseling and BRCA1 and for BRCA2 testing. BRCA-related cancers include these types: ? Breast. This occurs in males or females. ? Ovarian. ? Tubal. This may also be called fallopian tube cancer. ? Cancer of the abdominal or pelvic lining (peritoneal cancer). ? Prostate. ? Pancreatic.  Cervical, Uterine, and Ovarian Cancer Your health care provider may recommend that you be screened regularly for cancer of the pelvic organs. These include your ovaries, uterus, and vagina. This screening involves a pelvic exam, which includes checking for microscopic changes to the surface of your cervix (Pap test).  For women ages 21-65, health care providers may recommend a pelvic exam and a Pap test every three years. For women ages 19-65, they may recommend the Pap test and pelvic exam, combined with testing for human papilloma virus (HPV), every five years. Some types of HPV increase your risk of cervical cancer. Testing for HPV may also be done on women of any age who have unclear Pap test results.  Other health care providers may not recommend any screening for nonpregnant women who are considered low risk for pelvic cancer and have no symptoms. Ask your health care provider if a screening pelvic exam is right for you.  If you have had past treatment  for cervical cancer or a condition that could lead to cancer, you need Pap tests and screening for cancer for at least 20 years after your treatment. If Pap tests have been discontinued for you, your risk factors (such as having a new sexual partner) need to be reassessed to determine if you should start having screenings again. Some women have medical problems that increase the chance of getting cervical cancer. In these cases, your health care provider may recommend that you have screening and Pap tests more often.  If you have a family history of uterine cancer or ovarian cancer, talk with your health care provider about genetic screening.  If you have vaginal bleeding after reaching menopause, tell your health care provider.  There are currently no reliable tests available to screen for ovarian cancer.  Lung Cancer Lung cancer screening is recommended for adults 8-75 years old who are at high risk for lung cancer because of a history of smoking. A yearly low-dose CT scan of the lungs is recommended if you:  Currently smoke.  Have a history of at least 30 pack-years of smoking and you currently smoke or have quit within the past 15 years. A pack-year is smoking an average of one pack of cigarettes per day for one year.  Yearly screening should:  Continue until it  has been 15 years since you quit.  Stop if you develop a health problem that would prevent you from having lung cancer treatment.  Colorectal Cancer  This type of cancer can be detected and can often be prevented.  Routine colorectal cancer screening usually begins at age 53 and continues through age 75.  If you have risk factors for colon cancer, your health care provider may recommend that you be screened at an earlier age.  If you have a family history of colorectal cancer, talk with your health care provider about genetic screening.  Your health care provider may also recommend using home test kits to check for hidden  blood in your stool.  A small camera at the end of a tube can be used to examine your colon directly (sigmoidoscopy or colonoscopy). This is done to check for the earliest forms of colorectal cancer.  Direct examination of the colon should be repeated every 5-10 years until age 53. However, if early forms of precancerous polyps or small growths are found or if you have a family history or genetic risk for colorectal cancer, you may need to be screened more often.  Skin Cancer  Check your skin from head to toe regularly.  Monitor any moles. Be sure to tell your health care provider: ? About any new moles or changes in moles, especially if there is a change in a mole's shape or color. ? If you have a mole that is larger than the size of a pencil eraser.  If any of your family members has a history of skin cancer, especially at a young age, talk with your health care provider about genetic screening.  Always use sunscreen. Apply sunscreen liberally and repeatedly throughout the day.  Whenever you are outside, protect yourself by wearing long sleeves, pants, a wide-brimmed hat, and sunglasses.  What should I know about osteoporosis? Osteoporosis is a condition in which bone destruction happens more quickly than new bone creation. After menopause, you may be at an increased risk for osteoporosis. To help prevent osteoporosis or the bone fractures that can happen because of osteoporosis, the following is recommended:  If you are 71-6 years old, get at least 1,000 mg of calcium and at least 600 mg of vitamin D per day.  If you are older than age 61 but younger than age 33, get at least 1,200 mg of calcium and at least 600 mg of vitamin D per day.  If you are older than age 80, get at least 1,200 mg of calcium and at least 800 mg of vitamin D per day.  Smoking and excessive alcohol intake increase the risk of osteoporosis. Eat foods that are rich in calcium and vitamin D, and do weight-bearing  exercises several times each week as directed by your health care provider. What should I know about how menopause affects my mental health? Depression may occur at any age, but it is more common as you become older. Common symptoms of depression include:  Low or sad mood.  Changes in sleep patterns.  Changes in appetite or eating patterns.  Feeling an overall lack of motivation or enjoyment of activities that you previously enjoyed.  Frequent crying spells.  Talk with your health care provider if you think that you are experiencing depression. What should I know about immunizations? It is important that you get and maintain your immunizations. These include:  Tetanus, diphtheria, and pertussis (Tdap) booster vaccine.  Influenza every year before the flu season begins.  Pneumonia vaccine.  Shingles vaccine.  Your health care provider may also recommend other immunizations. This information is not intended to replace advice given to you by your health care provider. Make sure you discuss any questions you have with your health care provider. Document Released: 06/26/2005 Document Revised: 11/22/2015 Document Reviewed: 02/05/2015 Elsevier Interactive Patient Education  2018 [employer].

## 2017-12-07 NOTE — Assessment & Plan Note (Signed)
Annual comprehensive preventive exam was done as well as an evaluation and management of chronic conditions .  During the course of the visit the patient was educated and counseled about appropriate screening and preventive services including :  diabetes screening, lipid analysis with projected  10 year  risk for CAD , nutrition counseling, breast, cervical and colorectal cancer screening, and recommended immunizations.  Printed recommendations for health maintenance screenings was given 

## 2017-12-07 NOTE — Assessment & Plan Note (Signed)
History is suggestive of plantar fasciitis. She plans to see podiatry

## 2017-12-07 NOTE — Assessment & Plan Note (Signed)
10 yr risk is 12.6 % based on  2018 lipid profile. She prefers to avoid statins but has a strong FH .  Wt loss and Mediterranean low GI diet recommended.  Continue aspirin.

## 2017-12-07 NOTE — Progress Notes (Signed)
Patient ID: Jo White, female    DOB: Jul 29, 1950  Age: 67 y.o. MRN: [id number]  The patient is here for annual Medicare wellness examination and management of other chronic and acute problems.  Last seem Jan 2018 Colon May 2018 mamogram July 2019  at [facility]   Bladder cancer  Treated March 2017 Rogers Blocker) regular follow up since ,  Last time April 2019 annual  Has PF bilaterally  For the last 4-5 years,  No worse   Wearing tennis shoes. Prevents her from walking great distances      The risk factors are reflected in the social history.  The roster of all physicians providing medical care to patient - is listed in the Snapshot section of the chart.  Activities of daily living:  The patient is 100% independent in all ADLs: dressing, toileting, feeding as well as independent mobility  Home safety : The patient has smoke detectors in the home. They wear seatbelts.  There are no firearms at home. There is no violence in the home.   There is no risks for hepatitis, STDs or HIV. There is no   history of blood transfusion. They have no travel history to infectious disease endemic areas of the world.  The patient has seen their dentist in the last six month. They have seen their eye doctor in the last year. They admit to slight hearing difficulty with regard to whispered voices and some television programs.  They have deferred audiologic testing in the last year.  They do not  have excessive sun exposure. Discussed the need for sun protection: hats, long sleeves and use of sunscreen if there is significant sun exposure.   Diet: the importance of a healthy diet is discussed. They do have a healthy diet.  The benefits of regular aerobic exercise were discussed. She walks 4 times per week ,  20 minutes.   Depression screen: there are no signs or vegative symptoms of depression- irritability, change in appetite, anhedonia, sadness/tearfullness.  Cognitive assessment: the patient manages all their  financial and personal affairs and is actively engaged. They could relate day,date,year and events; recalled 2/3 objects at 3 minutes; performed clock-face test normally.  The following portions of the patient's history were reviewed and updated as appropriate: allergies, current medications, past family history, past medical history,  past surgical history, past social history  and problem list.  Visual acuity was not assessed per patient preference since she has regular follow up with her ophthalmologist. Hearing and body mass index were assessed and reviewed.   During the course of the visit the patient was educated and counseled about appropriate screening and preventive services including : fall prevention , diabetes screening, nutrition counseling, colorectal cancer screening, and recommended immunizations.    CC: The primary encounter diagnosis was Fatigue, unspecified type. Diagnoses of Hyperlipidemia LDL goal <130, Vitamin D deficiency, Hyperlipidemia LDL goal <100, Chronic heel pain, unspecified laterality, Malignant neoplasm of urinary bladder, unspecified site [facility]), and Breast cancer screening were also pertinent to this visit.   Foot pain secondary to plantar fasciitis. Plans to see podiatry    History Jo White has a past medical history of Anemia, Blood in stool, Cancer ([place]), Complication of anesthesia, External hemorrhoids without mention of complication, Internal hemorrhoids with other complication, Otalgia, unspecified, Personal history of unspecified circulatory disease, PONV (postoperative nausea and vomiting), Swelling, mass, or lump in head and neck, and UTI (urinary tract infection) (07-2015).   She has a past surgical history that includes fallopian  tube removal; Appendectomy; in vitro fertilization (1985); Breast biopsy; Hemorrhoid surgery (11/2006); Diagnostic laparoscopy; Colonoscopy; Transurethral resection of bladder tumor (N/A, 08/06/2015); and Cystoscopy.   Her family  history includes Bradycardia in her mother; Breast cancer in her sister; Cancer in her sister; Colon polyps in her mother; Diabetes in her paternal grandmother, sister, and sister; Diverticulosis in her mother; Heart attack in her father; Heart disease in her mother; Heart disease (age of onset: 66) in her father; Heart failure in her mother; Hyperlipidemia in her sister; Hypertension in her brother and mother; Skin cancer in her sister.She reports that she quit smoking about 45 years ago. Her smoking use included cigarettes. She has a 1.50 pack-year smoking history. She has never used smokeless tobacco. She reports that she drinks alcohol. She reports that she does not use drugs.  Outpatient Medications Prior to Visit  Medication Sig Dispense Refill  . Vitamin D, Ergocalciferol, (DRISDOL) 50000 units CAPS capsule TAKE 1 CAPSULE ONCE A WEEK 4 capsule 3  . cyanocobalamin (,VITAMIN B-12,) 1000 MCG/ML injection Inject 1 mL (1,000 mcg total) into the muscle once a week. For 3 weeks,  Then monthly (Patient not taking: Reported on 12/07/2017) 10 mL 3  . levofloxacin (LEVAQUIN) 500 MG tablet Take 500 mg by mouth daily.     Facility-Administered Medications Prior to Visit  Medication Dose Route Frequency Provider Last Rate Last Dose  . 0.9 %  sodium chloride infusion  500 mL Intravenous Continuous Milus Banister, MD        Review of Systems   Patient denies headache, fevers, malaise, unintentional weight loss, skin rash, eye pain, sinus congestion and sinus pain, sore throat, dysphagia,  hemoptysis , cough, dyspnea, wheezing, chest pain, palpitations, orthopnea, edema, abdominal pain, nausea, melena, diarrhea, constipation, flank pain, dysuria, hematuria, urinary  Frequency, nocturia, numbness, tingling, seizures,  Focal weakness, Loss of consciousness,  Tremor, insomnia, depression, anxiety, and suicidal ideation.      Objective:  BP (!) 142/82 (BP Location: Left Arm, Patient Position: Sitting, Cuff  Size: Large)   Pulse 70   Temp 98.1 F (36.7 C) (Oral)   Resp 15   Ht 5\' 7"  (1.702 m)   Wt 226 lb (102.5 kg)   SpO2 97%   BMI 35.40 kg/m   Physical Exam   General appearance: alert, cooperative and appears stated age Head: Normocephalic, without obvious abnormality, atraumatic Eyes: conjunctivae/corneas clear. PERRL, EOM's intact. Fundi benign. Ears: normal TM's and external ear canals both ears Nose: Nares normal. Septum midline. Mucosa normal. No drainage or sinus tenderness. Throat: lips, mucosa, and tongue normal; teeth and gums normal Neck: no adenopathy, no carotid bruit, no JVD, supple, symmetrical, trachea midline and thyroid not enlarged, symmetric, no tenderness/mass/nodules Lungs: clear to auscultation bilaterally Breasts: normal appearance, no masses or tenderness Heart: regular rate and rhythm, S1, S2 normal, no murmur, click, rub or gallop Abdomen: soft, non-tender; bowel sounds normal; no masses,  no organomegaly Extremities: extremities normal, atraumatic, no cyanosis or edema Pulses: 2+ and symmetric Skin: Skin color, texture, turgor normal. No rashes or lesions Neurologic: Alert and oriented X 3, normal strength and tone. Normal symmetric reflexes. Normal coordination and gait.     Assessment & Plan:   Problem List Items Addressed This Visit    Vitamin D deficiency   Relevant Orders   VITAMIN D 25 Hydroxy (Vit-D Deficiency, Fractures)   Hyperlipidemia LDL goal <100    10 yr risk is 12.6 % based on  2018 lipid profile. She prefers  to avoid statins but has a strong FH .  Wt loss and Mediterranean low GI diet recommended.  Continue aspirin.         Chronic heel pain    History is suggestive of plantar fasciitis. She plans to see podiatry       Breast cancer screening    Annual comprehensive preventive exam was done as well as an evaluation and management of chronic conditions .  During the course of the visit the patient was educated and counseled about  appropriate screening and preventive services including :  diabetes screening, lipid analysis with projected  10 year  risk for CAD , nutrition counseling, breast, cervical and colorectal cancer screening, and recommended immunizations.  Printed recommendations for health maintenance screenings was given      Bladder cancer ([place])    No recurrence by follow up since 2017.  Now on annual check ups with Dr Rogers Blocker.        Other Visit Diagnoses    Fatigue, unspecified type    -  Primary   Relevant Orders   Comprehensive metabolic panel   TSH   Vitamin B12   CBC with Differential/Platelet   Hyperlipidemia LDL goal <130       Relevant Orders   Lipid panel    A total of 40 minutes was spent with patient more than half of which was spent in counseling patient on the above mentioned issues , reviewing and explaining recent labs and imaging studies done, and coordination of care.   I am having [name] start on Zoster Vaccine Adjuvanted and Zoster Vaccine Adjuvanted. I am also having her maintain her levofloxacin, Vitamin D (Ergocalciferol), and cyanocobalamin. We will continue to administer sodium chloride.  Meds ordered this encounter  Medications  . Zoster Vaccine Adjuvanted [facility]) injection    Sig: Inject 0.5 mLs into the muscle once for 1 dose.    Dispense:  1 each    Refill:  1  . Zoster Vaccine Adjuvanted [facility]) injection    Sig: Inject 0.5 mLs into the muscle once for 1 dose.    Dispense:  1 each    Refill:  1    There are no discontinued medications.  Follow-up: No follow-ups on file.   Crecencio Mc, MD

## 2017-12-09 ENCOUNTER — Other Ambulatory Visit: Payer: Medicare Other

## 2017-12-10 ENCOUNTER — Other Ambulatory Visit (INDEPENDENT_AMBULATORY_CARE_PROVIDER_SITE_OTHER): Payer: Medicare Other

## 2017-12-10 DIAGNOSIS — E785 Hyperlipidemia, unspecified: Secondary | ICD-10-CM | POA: Diagnosis not present

## 2017-12-10 DIAGNOSIS — R5383 Other fatigue: Secondary | ICD-10-CM

## 2017-12-10 DIAGNOSIS — E538 Deficiency of other specified B group vitamins: Secondary | ICD-10-CM | POA: Diagnosis not present

## 2017-12-10 DIAGNOSIS — E559 Vitamin D deficiency, unspecified: Secondary | ICD-10-CM

## 2017-12-10 LAB — COMPREHENSIVE METABOLIC PANEL
ALT: 10 U/L (ref 0–35)
AST: 13 U/L (ref 0–37)
Albumin: 3.8 g/dL (ref 3.5–5.2)
Alkaline Phosphatase: 75 U/L (ref 39–117)
BUN: 13 mg/dL (ref 6–23)
CO2: 29 mEq/L (ref 19–32)
Calcium: 8.8 mg/dL (ref 8.4–10.5)
Chloride: 106 mEq/L (ref 96–112)
Creatinine, Ser: 0.99 mg/dL (ref 0.40–1.20)
GFR: 59.58 mL/min — ABNORMAL LOW (ref >60.00)
Glucose, Bld: 110 mg/dL — ABNORMAL HIGH (ref 70–99)
Potassium: 4.2 mEq/L (ref 3.5–5.1)
Sodium: 142 mEq/L (ref 135–145)
Total Bilirubin: 0.5 mg/dL (ref 0.2–1.2)
Total Protein: 6.6 g/dL (ref 6.0–8.3)

## 2017-12-10 LAB — LIPID PANEL
Cholesterol: 195 mg/dL (ref 0–200)
HDL: 35.5 mg/dL — ABNORMAL LOW (ref >39.00)
LDL Cholesterol: 120 mg/dL — ABNORMAL HIGH (ref 0–99)
NonHDL: 159.13
Total CHOL/HDL Ratio: 5
Triglycerides: 194 mg/dL — ABNORMAL HIGH (ref 0.0–149.0)
VLDL: 38.8 mg/dL (ref 0.0–40.0)

## 2017-12-10 LAB — CBC WITH DIFFERENTIAL/PLATELET
Basophils Absolute: 0 10*3/uL (ref 0.0–0.1)
Basophils Relative: 0.6 % (ref 0.0–3.0)
Eosinophils Absolute: 0.2 10*3/uL (ref 0.0–0.7)
Eosinophils Relative: 3.2 % (ref 0.0–5.0)
HCT: 37.5 % (ref 36.0–46.0)
Hemoglobin: 12.8 g/dL (ref 12.0–15.0)
Lymphocytes Relative: 36.2 % (ref 12.0–46.0)
Lymphs Abs: 1.8 10*3/uL (ref 0.7–4.0)
MCHC: 34 g/dL (ref 30.0–36.0)
MCV: 85.4 fl (ref 78.0–100.0)
Monocytes Absolute: 0.5 10*3/uL (ref 0.1–1.0)
Monocytes Relative: 9.5 % (ref 3.0–12.0)
Neutro Abs: 2.5 10*3/uL (ref 1.4–7.7)
Neutrophils Relative %: 50.5 % (ref 43.0–77.0)
Platelets: 223 10*3/uL (ref 150.0–400.0)
RBC: 4.39 Mil/uL (ref 3.87–5.11)
RDW: 13.5 % (ref 11.5–15.5)
WBC: 5 10*3/uL (ref 4.0–10.5)

## 2017-12-10 LAB — VITAMIN D 25 HYDROXY (VIT D DEFICIENCY, FRACTURES): VITD: 17.17 ng/mL — ABNORMAL LOW (ref 30.00–100.00)

## 2017-12-10 LAB — TSH: TSH: 5.15 u[IU]/mL — ABNORMAL HIGH (ref 0.35–4.50)

## 2017-12-10 LAB — VITAMIN B12: Vitamin B-12: 209 pg/mL — ABNORMAL LOW (ref 211–911)

## 2017-12-12 ENCOUNTER — Other Ambulatory Visit: Payer: Self-pay | Admitting: Internal Medicine

## 2017-12-12 DIAGNOSIS — E039 Hypothyroidism, unspecified: Secondary | ICD-10-CM

## 2017-12-12 DIAGNOSIS — D513 Other dietary vitamin B12 deficiency anemia: Secondary | ICD-10-CM

## 2017-12-12 DIAGNOSIS — E785 Hyperlipidemia, unspecified: Secondary | ICD-10-CM

## 2017-12-12 DIAGNOSIS — R7989 Other specified abnormal findings of blood chemistry: Secondary | ICD-10-CM

## 2017-12-12 DIAGNOSIS — R7301 Impaired fasting glucose: Secondary | ICD-10-CM

## 2017-12-12 DIAGNOSIS — E559 Vitamin D deficiency, unspecified: Secondary | ICD-10-CM

## 2017-12-12 MED ORDER — CYANOCOBALAMIN 1000 MCG/ML IJ SOLN: 1000.0000 ug | INTRAMUSCULAR | 3 refills | Status: DC

## 2017-12-12 MED ORDER — VITAMIN D (ERGOCALCIFEROL) 1.25 MG (50000 UNIT) PO CAPS: ORAL_CAPSULE | ORAL | 0 refills | Status: DC

## 2017-12-13 ENCOUNTER — Other Ambulatory Visit: Payer: Self-pay | Admitting: Internal Medicine

## 2018-03-14 ENCOUNTER — Ambulatory Visit: Payer: Medicare Other

## 2018-03-15 ENCOUNTER — Other Ambulatory Visit: Payer: Medicare Other

## 2018-03-15 ENCOUNTER — Ambulatory Visit (INDEPENDENT_AMBULATORY_CARE_PROVIDER_SITE_OTHER): Payer: Medicare Other

## 2018-03-15 DIAGNOSIS — R7301 Impaired fasting glucose: Secondary | ICD-10-CM

## 2018-03-15 DIAGNOSIS — R7989 Other specified abnormal findings of blood chemistry: Secondary | ICD-10-CM

## 2018-03-15 DIAGNOSIS — E039 Hypothyroidism, unspecified: Secondary | ICD-10-CM

## 2018-03-15 DIAGNOSIS — D513 Other dietary vitamin B12 deficiency anemia: Secondary | ICD-10-CM

## 2018-03-15 DIAGNOSIS — Z23 Encounter for immunization: Secondary | ICD-10-CM

## 2018-03-15 NOTE — Addendum Note (Signed)
Addended by: Arby Barrette on: 03/15/2018 08:52 AM   Modules accepted: Orders

## 2018-03-15 NOTE — Progress Notes (Signed)
Pt was seen today for a NV to receive high dose flu shot given in LD. Pt tolerated well.

## 2018-03-19 LAB — HEMOGLOBIN A1C
Hgb A1c MFr Bld: 5.2 % of total Hgb (ref <5.7)
Mean Plasma Glucose: 103 (calc)
eAG (mmol/L): 5.7 (calc)

## 2018-03-19 LAB — COMPREHENSIVE METABOLIC PANEL
AG Ratio: 1.4 (calc) (ref 1.0–2.5)
ALT: 10 U/L (ref 6–29)
AST: 14 U/L (ref 10–35)
Albumin: 3.9 g/dL (ref 3.6–5.1)
Alkaline phosphatase (APISO): 87 U/L (ref 33–130)
BUN: 14 mg/dL (ref 7–25)
CO2: 23 mmol/L (ref 20–32)
Calcium: 8.9 mg/dL (ref 8.6–10.4)
Chloride: 107 mmol/L (ref 98–110)
Creat: 0.99 mg/dL (ref 0.50–0.99)
Globulin: 2.7 g/dL (calc) (ref 1.9–3.7)
Glucose, Bld: 115 mg/dL — ABNORMAL HIGH (ref 65–99)
Potassium: 4.4 mmol/L (ref 3.5–5.3)
Sodium: 144 mmol/L (ref 135–146)
Total Bilirubin: 0.4 mg/dL (ref 0.2–1.2)
Total Protein: 6.6 g/dL (ref 6.1–8.1)

## 2018-03-19 LAB — TSH: TSH: 4.28 mIU/L (ref 0.40–4.50)

## 2018-03-19 LAB — INTRINSIC FACTOR ANTIBODIES: Intrinsic Factor: NEGATIVE

## 2018-04-20 ENCOUNTER — Encounter: Payer: Self-pay | Admitting: Emergency Medicine

## 2018-04-20 ENCOUNTER — Emergency Department
Admission: EM | Admit: 2018-04-20 | Discharge: 2018-04-20 | Disposition: A | Discharge status: Home / Self Care | Payer: Medicare Other | Attending: Emergency Medicine | Admitting: Emergency Medicine

## 2018-04-20 ENCOUNTER — Emergency Department: Payer: Medicare Other

## 2018-04-20 ENCOUNTER — Ambulatory Visit: Payer: Self-pay

## 2018-04-20 ENCOUNTER — Other Ambulatory Visit: Payer: Self-pay

## 2018-04-20 DIAGNOSIS — Z8551 Personal history of malignant neoplasm of bladder: Secondary | ICD-10-CM | POA: Insufficient documentation

## 2018-04-20 DIAGNOSIS — M79604 Pain in right leg: Secondary | ICD-10-CM

## 2018-04-20 DIAGNOSIS — Z87891 Personal history of nicotine dependence: Secondary | ICD-10-CM | POA: Insufficient documentation

## 2018-04-20 DIAGNOSIS — R2241 Localized swelling, mass and lump, right lower limb: Secondary | ICD-10-CM | POA: Insufficient documentation

## 2018-04-20 DIAGNOSIS — Z79899 Other long term (current) drug therapy: Secondary | ICD-10-CM | POA: Insufficient documentation

## 2018-04-20 DIAGNOSIS — M79661 Pain in right lower leg: Secondary | ICD-10-CM | POA: Insufficient documentation

## 2018-04-20 DIAGNOSIS — I83811 Varicose veins of right lower extremities with pain: Secondary | ICD-10-CM | POA: Diagnosis not present

## 2018-04-20 LAB — BASIC METABOLIC PANEL
Anion gap: 8 (ref 5–15)
BUN: 13 mg/dL (ref 8–23)
CO2: 24 mmol/L (ref 22–32)
Calcium: 8.9 mg/dL (ref 8.9–10.3)
Chloride: 109 mmol/L (ref 98–111)
Creatinine, Ser: 0.78 mg/dL (ref 0.44–1.00)
GFR calc Af Amer: >60 mL/min (ref >60)
GFR calc non Af Amer: >60 mL/min (ref >60)
Glucose, Bld: 127 mg/dL — ABNORMAL HIGH (ref 70–99)
Potassium: 3.9 mmol/L (ref 3.5–5.1)
Sodium: 141 mmol/L (ref 135–145)

## 2018-04-20 LAB — CBC
HCT: 36.3 % (ref 36.0–46.0)
Hemoglobin: 12 g/dL (ref 12.0–15.0)
MCH: 28.5 pg (ref 26.0–34.0)
MCHC: 33.1 g/dL (ref 30.0–36.0)
MCV: 86.2 fL (ref 80.0–100.0)
Platelets: 211 10*3/uL (ref 150–400)
RBC: 4.21 MIL/uL (ref 3.87–5.11)
RDW: 13.6 % (ref 11.5–15.5)
WBC: 7.5 10*3/uL (ref 4.0–10.5)
nRBC: 0 % (ref 0.0–0.2)

## 2018-04-20 NOTE — ED Triage Notes (Signed)
Pt in via POV with complaints of right leg pain, noticing a "knot" to right lateral calf.  No redness, warmth noted at this time.  Pt denies any hx of blood clots, denies blood thinners.  NAD noted at this time.

## 2018-04-20 NOTE — Telephone Encounter (Signed)
Pt called stating that last night she started to have burning pain to her rt outer calf. She has a small lump today to the area. She is able to walk.  She states it may be a little warm to touch.  She has no chest pain or SOB. She has not had any injury to area although she states she had a cancer removed near this area in July. She states she never had any issues and the area has been completely healed. Call placed to office [facility]. Pt will be seen today in ED to R/O blood clot. Care advice read to patient. Pt verbalized understanding of all instructions.  Reason for Disposition . [1] Thigh or calf pain AND [2] only 1 side AND [3] present > 1 hour  Answer Assessment - Initial Assessment Questions 1. ONSET: "When did the pain start?"      Last night 2. LOCATION: "Where is the pain located?"      Rt calf outer part of leg now a lump 3. PAIN: "How bad is the pain?"    (Scale 1-10; or mild, moderate, severe)   -  MILD (1-3): doesn't interfere with normal activities    -  MODERATE (4-7): interferes with normal activities (e.g., work or school) or awakens from sleep, limping    -  SEVERE (8-10): excruciating pain, unable to do any normal activities, unable to walk     Burning sensation in the area Moderate 4. WORK OR EXERCISE: "Has there been any recent work or exercise that involved this part of the body?"     no 5. CAUSE: "What do you think is causing the leg pain?"    Lump size of large cherry 6. OTHER SYMPTOMS: "Do you have any other symptoms?" (e.g., chest pain, back pain, breathing difficulty, swelling, rash, fever, numbness, weakness)     No swelling just a knot no redness not hot to touch maybe a little warm. 7. PREGNANCY: "Is there any chance you are pregnant?" "When was your last menstrual period?"     No  Protocols used: LEG PAIN-A-AH

## 2018-04-20 NOTE — ED Notes (Signed)
See triage note  Presents with pain to right lower leg  States she developed a "knot" to lateral calf   No redness or swelling noted  Ambulatory to room

## 2018-04-20 NOTE — ED Notes (Signed)
Blue top sent to lab as well.  

## 2018-04-20 NOTE — ED Provider Notes (Signed)
[facility] Emergency Department Provider Note  ____________________________________________   First MD Initiated Contact with Patient 04/20/18 1342     (approximate)  I have reviewed the triage vital signs and the nursing notes.   HISTORY  Chief Complaint Leg Pain    HPI Jo White is a 67 y.o. female presents emergency department complaining of right lower leg pain.  She states she noticed a knot and has had stinging in that part of her leg.  She called her PCP and they told her to come to the emergency department to be evaluated for DVT.  She denies any chest pain or shortness of breath.  She denies any known injury.  No history of DVTs.    Past Medical History:  Diagnosis Date  . Anemia   . Blood in stool   . Cancer ([place])    bladder  . Complication of anesthesia   . External hemorrhoids without mention of complication   . Internal hemorrhoids with other complication   . Otalgia, unspecified   . Personal history of unspecified circulatory disease   . PONV (postoperative nausea and vomiting)   . Swelling, mass, or lump in head and neck   . UTI (urinary tract infection) 07-2015    Patient Active Problem List   Diagnosis Date Noted  . Bladder cancer ([place]) 05/31/2016  . Frozen shoulder syndrome 09/18/2014  . Pain in joint, shoulder region 08/19/2014  . Chronic heel pain 08/19/2014  . Obesity 05/27/2013  . Breast cancer screening 11/24/2012  . B12 deficiency anemia 11/16/2012  . Vitamin D deficiency 11/16/2012  . Lipoma of neck 01/12/2012  . Family history of MI (myocardial infarction) 05/08/2011  . GERD (gastroesophageal reflux disease) 05/08/2011  . Hyperlipidemia LDL goal <100 09/04/2010  . Osteopenia 09/04/2010  . HEART MURMUR, HX OF 10/15/2006    Past Surgical History:  Procedure Laterality Date  . APPENDECTOMY    . BREAST BIOPSY     right  . COLONOSCOPY    . CYSTOSCOPY    . DIAGNOSTIC LAPAROSCOPY     MULITPLE FOR IN VITRO    . fallopian tube removal     r tube  . HEMORRHOID SURGERY  11/2006  . in vitro fertilization  1985  . TRANSURETHRAL RESECTION OF BLADDER TUMOR N/A 08/06/2015   Procedure: TRANSURETHRAL RESECTION OF BLADDER TUMOR (TURBT);  Surgeon: Royston Cowper, MD;  Location: [facility] ORS;  Service: Urology;  Laterality: N/A;    Prior to Admission medications   Medication Sig Start Date End Date Taking? Authorizing Provider  cyanocobalamin (,VITAMIN B-12,) 1000 MCG/ML injection Inject 1 mL (1,000 mcg total) into the muscle once a week. For 3 weeks,  Then monthly 12/12/17   Crecencio Mc, MD  levofloxacin (LEVAQUIN) 500 MG tablet Take 500 mg by mouth daily.    [provider]  Vitamin D, Ergocalciferol, (DRISDOL) 50000 units CAPS capsule TAKE 1 CAPSULE ONCE A WEEK 12/12/17   Crecencio Mc, MD    Allergies Patient has no known allergies.  Family History  Problem Relation Age of Onset  . Heart disease Father 46       secodnary to Bright's disease  . Heart attack Father   . Hypertension Mother   . Bradycardia Mother   . Heart failure Mother   . Colon polyps Mother   . Diverticulosis Mother   . Heart disease Mother   . Diabetes Sister   . Hyperlipidemia Sister   . Cancer Sister  breast  . Breast cancer Sister   . Hypertension Brother   . Diabetes Paternal Grandmother   . Diabetes Sister   . Skin cancer Sister   . Colon cancer Neg Hx     Social History Social History   Tobacco Use  . Smoking status: Former Smoker    Packs/day: 1.00    Years: 1.50    Pack years: 1.50    Types: Cigarettes    Last attempt to quit: 01/11/1972    Years since quitting: 46.3  . Smokeless tobacco: Never Used  . Tobacco comment: 1 1/2 years as a teen  Substance Use Topics  . Alcohol use: Yes    Comment: rare alcohol intake  . Drug use: No    Review of Systems  Constitutional: No fever/chills Eyes: No visual changes. ENT: No sore throat. Respiratory: Denies cough Genitourinary:  Negative for dysuria. Musculoskeletal: Negative for back pain.  Positive for right leg pain Skin: Negative for rash.    ____________________________________________   PHYSICAL EXAM:  VITAL SIGNS: ED Triage Vitals  Enc Vitals Group     BP 04/20/18 1240 (!) 187/82     Pulse Rate 04/20/18 1240 83     Resp 04/20/18 1240 18     Temp 04/20/18 1240 98.3 F (36.8 C)     Temp Source 04/20/18 1240 Oral     SpO2 04/20/18 1240 100 %     Weight 04/20/18 1241 215 lb (97.5 kg)     Height 04/20/18 1241 5' 7"  (1.702 m)     Head Circumference --      Peak Flow --      Pain Score 04/20/18 1240 1     Pain Loc --      Pain Edu? --      Excl. in [place]? --     Constitutional: Alert and oriented. Well appearing and in no acute distress. Eyes: Conjunctivae are normal.  Head: Atraumatic. Nose: No congestion/rhinnorhea. Mouth/Throat: Mucous membranes are moist.   Neck:  supple no lymphadenopathy noted Cardiovascular: Normal rate, regular rhythm. Heart sounds are normal Respiratory: Normal respiratory effort.  No retractions, lungs c t a  GU: deferred Musculoskeletal: FROM all extremities, warm and well perfused, the right leg is tender along the lateral aspect.  Small raised area questionably like a lipoma noted along the side of the calf.  Negative Homans sign. Neurologic:  Normal speech and language.  Skin:  Skin is warm, dry and intact. No rash noted. Psychiatric: Mood and affect are normal. Speech and behavior are normal.  ____________________________________________   LABS (all labs ordered are listed, but only abnormal results are displayed)  Labs Reviewed  BASIC METABOLIC PANEL - Abnormal; Notable for the following components:      Result Value   Glucose, Bld 127 (*)    All other components within normal limits  CBC   ____________________________________________   ____________________________________________  RADIOLOGY  Ultrasound of the right lower leg is negative for  DVT  ____________________________________________   PROCEDURES  Procedure(s) performed: No  Procedures    ____________________________________________   INITIAL IMPRESSION / ASSESSMENT AND PLAN / ED COURSE  Pertinent labs & imaging results that were available during my care of the patient were reviewed by me and considered in my medical decision making (see chart for details).   Patient is 67 year old female presents emergency department with complaints of right lower leg pain.  Physical exam shows a small swollen area noted on the right calf.  Ultrasound of the  right lower leg is negative for DVT.  Basic metabolic panel and CBC are normal  Explained the findings to the patient and her husband.  She was instructed to return if worsening.  If the swelling has increased she will need another ultrasound in 1 week.  She is to follow-up with Dr. Derrel Nip.  She states she understands will comply.  Explained to her that would be a good idea to wear compression type sock.  And take a baby aspirin a day.  She states she understands and was discharged in stable condition in the care of her husband.     As part of my medical decision making, I reviewed the following data within the [place] History obtained from family, Nursing notes reviewed and incorporated, Labs reviewed CBC met B are normal, Old chart reviewed, Radiograph reviewed sound is negative for DVT, Notes from prior ED visits and [place] Controlled Substance Database  ____________________________________________   FINAL CLINICAL IMPRESSION(S) / ED DIAGNOSES  Final diagnoses:  Right leg pain      NEW MEDICATIONS STARTED DURING THIS VISIT:  New Prescriptions   No medications on file     Note:  This document was prepared using Dragon voice recognition software and may include unintentional dictation errors.    Versie Starks, PA-C 04/20/18 1520    Earleen Newport, MD 04/20/18 [phone]

## 2018-04-20 NOTE — Telephone Encounter (Signed)
Patient on set sudden pain with lump in right calf, warm to touch pain rated at 7 on 0-10 scale , patient says has burning sensation , no injury just on set sudden during the night, advised ED to RO blood clot. Flexion of foot caused pain to increase. Patient going to ED R/O blood clot.

## 2018-04-20 NOTE — Discharge Instructions (Addendum)
You have been given information on DVTs.  Your ultrasound did not show a DVT today.  If you experience any of those symptoms please return to the emergency department for an ultrasound.  If you are not better or feel like you are worsening Dr. Derrel Nip could also order an outpatient ultrasound for you.

## 2018-06-08 DIAGNOSIS — N23 Unspecified renal colic: Secondary | ICD-10-CM | POA: Diagnosis not present

## 2018-06-08 DIAGNOSIS — Z8551 Personal history of malignant neoplasm of bladder: Secondary | ICD-10-CM | POA: Diagnosis not present

## 2018-06-08 DIAGNOSIS — N390 Urinary tract infection, site not specified: Secondary | ICD-10-CM | POA: Diagnosis not present

## 2018-06-08 DIAGNOSIS — R3915 Urgency of urination: Secondary | ICD-10-CM | POA: Diagnosis not present

## 2018-06-15 DIAGNOSIS — N200 Calculus of kidney: Secondary | ICD-10-CM | POA: Diagnosis not present

## 2018-06-17 DIAGNOSIS — N201 Calculus of ureter: Secondary | ICD-10-CM | POA: Diagnosis not present

## 2018-06-17 DIAGNOSIS — N390 Urinary tract infection, site not specified: Secondary | ICD-10-CM | POA: Diagnosis not present

## 2018-08-30 ENCOUNTER — Other Ambulatory Visit: Payer: Self-pay | Admitting: Internal Medicine

## 2018-08-30 NOTE — Telephone Encounter (Signed)
Last OV 12/07/2017   Last refilled 12/12/2017 disp 12 with no refills   Last check 7/26/20219 at 17.17 no normal   Next OV none scheduled

## 2018-08-31 DIAGNOSIS — Z8551 Personal history of malignant neoplasm of bladder: Secondary | ICD-10-CM | POA: Diagnosis not present

## 2018-09-30 ENCOUNTER — Other Ambulatory Visit: Payer: Self-pay

## 2018-12-21 ENCOUNTER — Other Ambulatory Visit: Payer: Self-pay

## 2018-12-21 ENCOUNTER — Ambulatory Visit (INDEPENDENT_AMBULATORY_CARE_PROVIDER_SITE_OTHER): Payer: PPO

## 2018-12-21 DIAGNOSIS — E039 Hypothyroidism, unspecified: Secondary | ICD-10-CM

## 2018-12-21 DIAGNOSIS — R5383 Other fatigue: Secondary | ICD-10-CM | POA: Diagnosis not present

## 2018-12-21 DIAGNOSIS — E559 Vitamin D deficiency, unspecified: Secondary | ICD-10-CM | POA: Diagnosis not present

## 2018-12-21 DIAGNOSIS — D513 Other dietary vitamin B12 deficiency anemia: Secondary | ICD-10-CM

## 2018-12-21 DIAGNOSIS — E785 Hyperlipidemia, unspecified: Secondary | ICD-10-CM | POA: Diagnosis not present

## 2018-12-21 DIAGNOSIS — Z1231 Encounter for screening mammogram for malignant neoplasm of breast: Secondary | ICD-10-CM

## 2018-12-21 DIAGNOSIS — R7301 Impaired fasting glucose: Secondary | ICD-10-CM | POA: Diagnosis not present

## 2018-12-21 DIAGNOSIS — Z1239 Encounter for other screening for malignant neoplasm of breast: Secondary | ICD-10-CM | POA: Diagnosis not present

## 2018-12-21 DIAGNOSIS — Z0000 Encounter for general adult medical examination without abnormal findings: Secondary | ICD-10-CM | POA: Diagnosis not present

## 2018-12-21 NOTE — Patient Instructions (Addendum)
  Jo White , Thank you for taking time to come for your Medicare Wellness Visit. I appreciate your ongoing commitment to your health goals. Please review the following plan we discussed and let me know if I can assist you in the future.   These are the goals we discussed: Goals      Patient Stated   . Follow up with Primary Care Provider (pt-stated)     Annual cpe with labs       This is a list of the screening recommended for you and due dates:  Health Maintenance  Topic Date Due  . Pneumonia vaccines (1 of 2 - PCV13) 08/02/2016  . Mammogram  12/01/2018  . Flu Shot  12/17/2018  . Tetanus Vaccine  11/22/2021  . Colon Cancer Screening  09/19/2026  . DEXA scan (bone density measurement)  Completed  .  Hepatitis C: One time screening is recommended by Center for Disease Control  (CDC) for  adults born from [birth year] through 1964.   Completed

## 2018-12-21 NOTE — Progress Notes (Signed)
Subjective:   Jo White is a 68 y.o. female who presents for an Initial Medicare Annual Wellness Visit.  Review of Systems    No ROS.  Medicare Wellness Virtual Visit.  Visual/audio telehealth visit, UTA vital signs.   See social history for additional risk factors.    Cardiac Risk Factors include: advanced age (>64men, >16 women)     Objective:    Today's Vitals   There is no height or weight on file to calculate BMI.  Advanced Directives 12/21/2018 04/20/2018 09/18/2016 09/04/2016 08/06/2015  Does Patient Have a Medical Advance Directive? No No No No No  Would patient like information on creating a medical advance directive? Yes (MAU/Ambulatory/Procedural Areas - Information given) No - Patient declined - - No - patient declined information    Current Medications (verified) Outpatient Encounter Medications as of 12/21/2018  Medication Sig  . cyanocobalamin (,VITAMIN B-12,) 1000 MCG/ML injection Inject 1 mL (1,000 mcg total) into the muscle once a week. For 3 weeks,  Then monthly  . levofloxacin (LEVAQUIN) 500 MG tablet Take 500 mg by mouth daily.  . Vitamin D, Ergocalciferol, (DRISDOL) 1.25 MG (50000 UT) CAPS capsule TAKE 1 CAPSULE EVERY WEEK (Patient not taking: Reported on 12/21/2018)   Facility-Administered Encounter Medications as of 12/21/2018  Medication  . 0.9 %  sodium chloride infusion    Allergies (verified) Patient has no known allergies.   History: Past Medical History:  Diagnosis Date  . Anemia   . Blood in stool   . Cancer ([place])    bladder  . Complication of anesthesia   . External hemorrhoids without mention of complication   . Internal hemorrhoids with other complication   . Otalgia, unspecified   . Personal history of unspecified circulatory disease   . PONV (postoperative nausea and vomiting)   . Swelling, mass, or lump in head and neck   . UTI (urinary tract infection) 07-2015   Past Surgical History:  Procedure Laterality Date  . APPENDECTOMY     . BREAST BIOPSY     right  . COLONOSCOPY    . CYSTOSCOPY    . DIAGNOSTIC LAPAROSCOPY     MULITPLE FOR IN VITRO  . fallopian tube removal     r tube  . HEMORRHOID SURGERY  11/2006  . in vitro fertilization  1985  . TRANSURETHRAL RESECTION OF BLADDER TUMOR N/A 08/06/2015   Procedure: TRANSURETHRAL RESECTION OF BLADDER TUMOR (TURBT);  Surgeon: Royston Cowper, MD;  Location: [facility] ORS;  Service: Urology;  Laterality: N/A;   Family History  Problem Relation Age of Onset  . Heart disease Father 14       secodnary to Bright's disease  . Heart attack Father   . Hypertension Mother   . Bradycardia Mother   . Heart failure Mother   . Colon polyps Mother   . Diverticulosis Mother   . Heart disease Mother   . Diabetes Sister   . Hyperlipidemia Sister   . Cancer Sister        breast  . Breast cancer Sister   . Hypertension Brother   . Diabetes Paternal Grandmother   . Diabetes Sister   . Skin cancer Sister   . Colon cancer Neg Hx    Social History   Socioeconomic History  . Marital status: Married    Spouse name: Not on file  . Number of children: 2  . Years of education: Not on file  . Highest education level: Not on file  Occupational History  . Occupation: [employer]    Employer: [employer]  . Financial resource strain: Not hard at all  . Food insecurity    Worry: Never true    Inability: Never true  . Transportation needs    Medical: No    Non-medical: No  Tobacco Use  . Smoking status: Former Smoker    Packs/day: 1.00    Years: 1.50    Pack years: 1.50    Types: Cigarettes    Quit date: 01/11/1972    Years since quitting: 46.9  . Smokeless tobacco: Never Used  . Tobacco comment: 1 1/2 years as a teen  Substance and Sexual Activity  . Alcohol use: Yes    Comment: rare alcohol intake  . Drug use: No  . Sexual activity: Not on file  Lifestyle  . Physical activity    Days per week: 3 days    Minutes per session: 20 min  . Stress: Not at all   Relationships  . Social Herbalist on phone: Not on file    Gets together: Not on file    Attends religious service: Not on file    Active member of club or organization: Not on file    Attends meetings of clubs or organizations: Not on file    Relationship status: Not on file  Other Topics Concern  . Not on file  Social History Narrative   No regular exercise, some walking   Diet- some fruit and veggies, avoid fast food    Tobacco Counseling Counseling given: Not Answered Comment: 1 1/2 years as a teen   Clinical Intake:  Pre-visit preparation completed: Yes        Diabetes: No  How often do you need to have someone help you when you read instructions, pamphlets, or other written materials from your doctor or pharmacy?: 1 - Never  Interpreter Needed?: No      Activities of Daily Living In your present state of health, do you have any difficulty performing the following activities: 12/21/2018  Hearing? N  Vision? N  Difficulty concentrating or making decisions? N  Walking or climbing stairs? N  Dressing or bathing? N  Doing errands, shopping? N  Preparing Food and eating ? N  Using the Toilet? N  In the past six months, have you accidently leaked urine? N  Do you have problems with loss of bowel control? N  Managing your Medications? N  Managing your Finances? N  Housekeeping or managing your Housekeeping? N  Some recent data might be hidden     Immunizations and Health Maintenance Immunization History  Administered Date(s) Administered  . Influenza Split 02/23/2013, 02/15/2014  . Influenza Whole 01/17/2008  . Influenza, High Dose Seasonal PF 03/15/2018  . Influenza-Unspecified 02/15/2014, 03/21/2016, 02/22/2017  . Td 07/02/2008  . Tdap 11/23/2011   Health Maintenance Due  Topic Date Due  . PNA vac Low Risk Adult (1 of 2 - PCV13) 08/02/2016  . MAMMOGRAM  12/01/2018  . INFLUENZA VACCINE  12/17/2018    Patient Care Team: Crecencio Mc, MD as PCP - General (Internal Medicine)  Indicate any recent Medical Services you may have received from other than [facility] providers in the past year (date may be approximate).     Assessment:   This is a routine wellness examination for Jo White.  I connected with patient 12/21/18 at  9:30 AM EDT by an audio enabled telemedicine application and verified that  I am speaking with the correct person using two identifiers. Patient stated full name and DOB. Patient gave permission to continue with virtual visit. Patient's location was at home and Nurse's location was at [place] office.   Cpe and fasting labs appointment scheduled. Labs ordered.   Health Screenings  Mammogram - 11/2017; order placed  Colonoscopy - 09/2016 Bone Density - 09/2010 Glaucoma -none Hearing -demonstrates normal hearing during visit. Hemoglobin A1C - 02/2018 (5.2) Cholesterol - 11/2017 Dental- UTD Vision- UTD. Wears glasses. Wears contacts.  Social  Alcohol intake - yes      Smoking history- former    Smokers in home? none Illicit drug use? none Exercise - active with family Diet - regular BMI- discussed the importance of a healthy diet, water intake and the benefits of aerobic exercise.  Educational material provided.   Safety  Patient feels safe at home- yes Patient does have smoke detectors at home- yes Patient does wear sunscreen or protective clothing when in direct sunlight -yes Patient does wear seat belt when in a moving vehicle -yes Patient drives- yes  [id number] precautions and sickness symptoms discussed.   Activities of Daily Living Patient denies needing assistance with: driving, household chores, feeding themselves, getting from bed to chair, getting to the toilet, bathing/showering, dressing, managing money, or preparing meals.  No new identified risk were noted.    Depression Screen Patient denies losing interest in daily life, feeling hopeless, or crying easily over simple problems.    Medication-taking as directed and without issues.   Fall Screen Patient denies being afraid of falling or falling in the last year.   Memory Screen Patient is alert.  Patient denies difficulty focusing, concentrating or misplacing items. Correctly identified the president of the [place], season and recall. Patient likes to read and play word search for brain stimulation.  Immunizations The following Immunizations were discussed: Influenza, shingles, pneumonia, and tetanus.   Other Providers Patient Care Team: Crecencio Mc, MD as PCP - General (Internal Medicine)  Hearing/Vision screen  Hearing Screening   125Hz  250Hz  500Hz  1000Hz  2000Hz  3000Hz  4000Hz  6000Hz  8000Hz   Right ear:           Left ear:           Comments: Patient is able to hear conversational tones without difficulty.  No issues reported.  Vision Screening Comments: Wears corrective lenses Visual acuity not assessed, virtual visit.  They have seen their ophthalmologist in the last 12 months     Dietary issues and exercise activities discussed: Current Exercise Habits: Home exercise routine, Time (Minutes): 20, Frequency (Times/Week): 4, Weekly Exercise (Minutes/Week): 80, Intensity: Mild  Goals      Patient Stated   . Follow up with Primary Care Provider (pt-stated)     Annual cpe with labs      Depression Screen PHQ 2/9 Scores 12/21/2018 12/07/2017 05/29/2016  PHQ - 2 Score 0 0 0  PHQ- 9 Score - 3 -    Fall Risk Fall Risk  12/21/2018 12/07/2017 05/29/2016  Falls in the past year? 0 No No   Cognitive Function:     6CIT Screen 12/21/2018  What Year? 0 points  What month? 0 points  What time? 0 points  Count back from 20 0 points  Months in reverse 0 points  Repeat phrase 0 points  Total Score 0    Screening Tests Health Maintenance  Topic Date Due  . PNA vac Low Risk Adult (1 of 2 - PCV13) 08/02/2016  .  MAMMOGRAM  12/01/2018  . INFLUENZA VACCINE  12/17/2018  . TETANUS/TDAP  11/22/2021  .  COLONOSCOPY  09/19/2026  . DEXA SCAN  Completed  . Hepatitis C Screening  Completed     Plan:    End of life planning; Advance aging; Advanced directives discussed.  Copy of current HCPOA/Living Will requested upon completion.   I have personally reviewed and noted the following in the patient's chart:   . Medical and social history . Use of alcohol, tobacco or illicit drugs  . Current medications and supplements . Functional ability and status . Nutritional status . Physical activity . Advanced directives . List of other physicians . Hospitalizations, surgeries, and ER visits in previous 12 months . Vitals . Screenings to include cognitive, depression, and falls . Referrals and appointments  In addition, I have reviewed and discussed with patient certain preventive protocols, quality metrics, and best practice recommendations. A written personalized care plan for preventive services as well as general preventive health recommendations were provided to patient.     Varney Biles, LPN   10/21/628

## 2019-01-05 ENCOUNTER — Other Ambulatory Visit: Payer: Self-pay

## 2019-01-05 DIAGNOSIS — Z20822 Contact with and (suspected) exposure to covid-19: Secondary | ICD-10-CM

## 2019-01-06 LAB — NOVEL CORONAVIRUS, NAA: SARS-CoV-2, NAA: NOT DETECTED

## 2019-01-16 ENCOUNTER — Other Ambulatory Visit: Payer: Self-pay

## 2019-01-16 ENCOUNTER — Other Ambulatory Visit (INDEPENDENT_AMBULATORY_CARE_PROVIDER_SITE_OTHER): Payer: PPO

## 2019-01-16 DIAGNOSIS — D513 Other dietary vitamin B12 deficiency anemia: Secondary | ICD-10-CM

## 2019-01-16 DIAGNOSIS — E039 Hypothyroidism, unspecified: Secondary | ICD-10-CM

## 2019-01-16 DIAGNOSIS — R5383 Other fatigue: Secondary | ICD-10-CM

## 2019-01-16 DIAGNOSIS — E785 Hyperlipidemia, unspecified: Secondary | ICD-10-CM | POA: Diagnosis not present

## 2019-01-16 DIAGNOSIS — E559 Vitamin D deficiency, unspecified: Secondary | ICD-10-CM | POA: Diagnosis not present

## 2019-01-16 DIAGNOSIS — R7301 Impaired fasting glucose: Secondary | ICD-10-CM | POA: Diagnosis not present

## 2019-01-16 LAB — COMPREHENSIVE METABOLIC PANEL
ALT: 12 U/L (ref 0–35)
AST: 14 U/L (ref 0–37)
Albumin: 3.8 g/dL (ref 3.5–5.2)
Alkaline Phosphatase: 79 U/L (ref 39–117)
BUN: 11 mg/dL (ref 6–23)
CO2: 27 mEq/L (ref 19–32)
Calcium: 8.4 mg/dL (ref 8.4–10.5)
Chloride: 108 mEq/L (ref 96–112)
Creatinine, Ser: 0.86 mg/dL (ref 0.40–1.20)
GFR: 65.73 mL/min (ref >60.00)
Glucose, Bld: 92 mg/dL (ref 70–99)
Potassium: 3.8 mEq/L (ref 3.5–5.1)
Sodium: 143 mEq/L (ref 135–145)
Total Bilirubin: 0.4 mg/dL (ref 0.2–1.2)
Total Protein: 5.9 g/dL — ABNORMAL LOW (ref 6.0–8.3)

## 2019-01-16 LAB — CBC WITH DIFFERENTIAL/PLATELET
Basophils Absolute: 0 10*3/uL (ref 0.0–0.1)
Basophils Relative: 0.6 % (ref 0.0–3.0)
Eosinophils Absolute: 0.2 10*3/uL (ref 0.0–0.7)
Eosinophils Relative: 4.9 % (ref 0.0–5.0)
HCT: 35.7 % — ABNORMAL LOW (ref 36.0–46.0)
Hemoglobin: 12.1 g/dL (ref 12.0–15.0)
Lymphocytes Relative: 38.7 % (ref 12.0–46.0)
Lymphs Abs: 1.5 10*3/uL (ref 0.7–4.0)
MCHC: 33.9 g/dL (ref 30.0–36.0)
MCV: 86.2 fl (ref 78.0–100.0)
Monocytes Absolute: 0.4 10*3/uL (ref 0.1–1.0)
Monocytes Relative: 9.5 % (ref 3.0–12.0)
Neutro Abs: 1.8 10*3/uL (ref 1.4–7.7)
Neutrophils Relative %: 46.3 % (ref 43.0–77.0)
Platelets: 199 10*3/uL (ref 150.0–400.0)
RBC: 4.13 Mil/uL (ref 3.87–5.11)
RDW: 13.9 % (ref 11.5–15.5)
WBC: 3.9 10*3/uL — ABNORMAL LOW (ref 4.0–10.5)

## 2019-01-16 LAB — TSH: TSH: 3.51 u[IU]/mL (ref 0.35–4.50)

## 2019-01-16 LAB — LIPID PANEL
Cholesterol: 189 mg/dL (ref 0–200)
HDL: 37.1 mg/dL — ABNORMAL LOW (ref >39.00)
NonHDL: 151.84
Total CHOL/HDL Ratio: 5
Triglycerides: 205 mg/dL — ABNORMAL HIGH (ref 0.0–149.0)
VLDL: 41 mg/dL — ABNORMAL HIGH (ref 0.0–40.0)

## 2019-01-16 LAB — LDL CHOLESTEROL, DIRECT: Direct LDL: 126 mg/dL

## 2019-01-16 LAB — HEMOGLOBIN A1C: Hgb A1c MFr Bld: 5.3 % (ref 4.6–6.5)

## 2019-01-16 LAB — VITAMIN D 25 HYDROXY (VIT D DEFICIENCY, FRACTURES): VITD: 16.73 ng/mL — ABNORMAL LOW (ref 30.00–100.00)

## 2019-01-16 LAB — VITAMIN B12: Vitamin B-12: 265 pg/mL (ref 211–911)

## 2019-01-17 ENCOUNTER — Other Ambulatory Visit: Payer: Self-pay | Admitting: Internal Medicine

## 2019-01-17 MED ORDER — VITAMIN D (ERGOCALCIFEROL) 1.25 MG (50000 UNIT) PO CAPS: ORAL_CAPSULE | ORAL | 0 refills | Status: DC

## 2019-01-18 ENCOUNTER — Other Ambulatory Visit: Payer: Self-pay

## 2019-01-18 ENCOUNTER — Encounter: Payer: Self-pay | Admitting: Internal Medicine

## 2019-01-18 ENCOUNTER — Ambulatory Visit (INDEPENDENT_AMBULATORY_CARE_PROVIDER_SITE_OTHER): Payer: PPO | Admitting: Internal Medicine

## 2019-01-18 DIAGNOSIS — E785 Hyperlipidemia, unspecified: Secondary | ICD-10-CM

## 2019-01-18 DIAGNOSIS — I10 Essential (primary) hypertension: Secondary | ICD-10-CM

## 2019-01-18 DIAGNOSIS — Z23 Encounter for immunization: Secondary | ICD-10-CM

## 2019-01-18 DIAGNOSIS — Z1239 Encounter for other screening for malignant neoplasm of breast: Secondary | ICD-10-CM

## 2019-01-18 DIAGNOSIS — E66811 Obesity, class 1: Secondary | ICD-10-CM

## 2019-01-18 DIAGNOSIS — E6609 Other obesity due to excess calories: Secondary | ICD-10-CM

## 2019-01-18 DIAGNOSIS — Z8551 Personal history of malignant neoplasm of bladder: Secondary | ICD-10-CM | POA: Diagnosis not present

## 2019-01-18 DIAGNOSIS — Z6833 Body mass index (BMI) 33.0-33.9, adult: Secondary | ICD-10-CM

## 2019-01-18 MED ORDER — CYANOCOBALAMIN 1000 MCG/ML IJ SOLN: 1000.0000 ug | INTRAMUSCULAR | 3 refills | Status: DC

## 2019-01-18 MED ORDER — VITAMIN D (ERGOCALCIFEROL) 1.25 MG (50000 UNIT) PO CAPS: ORAL_CAPSULE | ORAL | 0 refills | Status: DC

## 2019-01-18 NOTE — Patient Instructions (Addendum)
I will order your mammogram  Please resume b12 injections. Weekly for  3 weeks ,  Then monthly thereafter     Health Maintenance After Age 67 After age 5, you are at a higher risk for certain long-term diseases and infections as well as injuries from falls. Falls are a major cause of broken bones and head injuries in people who are older than age 81. Getting regular preventive care can help to keep you healthy and well. Preventive care includes getting regular testing and making lifestyle changes as recommended by your health care provider. Talk with your health care provider about:  Which screenings and tests you should have. A screening is a test that checks for a disease when you have no symptoms.  A diet and exercise plan that is right for you. What should I know about screenings and tests to prevent falls? Screening and testing are the best ways to find a health problem early. Early diagnosis and treatment give you the best chance of managing medical conditions that are common after age 76. Certain conditions and lifestyle choices may make you more likely to have a fall. Your health care provider may recommend:  Regular vision checks. Poor vision and conditions such as cataracts can make you more likely to have a fall. If you wear glasses, make sure to get your prescription updated if your vision changes.  Medicine review. Work with your health care provider to regularly review all of the medicines you are taking, including over-the-counter medicines. Ask your health care provider about any side effects that may make you more likely to have a fall. Tell your health care provider if any medicines that you take make you feel dizzy or sleepy.  Osteoporosis screening. Osteoporosis is a condition that causes the bones to get weaker. This can make the bones weak and cause them to break more easily.  Blood pressure screening. Blood pressure changes and medicines to control blood pressure can make  you feel dizzy.  Strength and balance checks. Your health care provider may recommend certain tests to check your strength and balance while standing, walking, or changing positions.  Foot health exam. Foot pain and numbness, as well as not wearing proper footwear, can make you more likely to have a fall.  Depression screening. You may be more likely to have a fall if you have a fear of falling, feel emotionally low, or feel unable to do activities that you used to do.  Alcohol use screening. Using too much alcohol can affect your balance and may make you more likely to have a fall. What actions can I take to lower my risk of falls? General instructions  Talk with your health care provider about your risks for falling. Tell your health care provider if: ? You fall. Be sure to tell your health care provider about all falls, even ones that seem minor. ? You feel dizzy, sleepy, or off-balance.  Take over-the-counter and prescription medicines only as told by your health care provider. These include any supplements.  Eat a healthy diet and maintain a healthy weight. A healthy diet includes low-fat dairy products, low-fat (lean) meats, and fiber from whole grains, beans, and lots of fruits and vegetables. Home safety  Remove any tripping hazards, such as rugs, cords, and clutter.  Install safety equipment such as grab bars in bathrooms and safety rails on stairs.  Keep rooms and walkways well-lit. Activity   Follow a regular exercise program to stay fit. This will help  you maintain your balance. Ask your health care provider what types of exercise are appropriate for you.  If you need a cane or walker, use it as recommended by your health care provider.  Wear supportive shoes that have nonskid soles. Lifestyle  Do not drink alcohol if your health care provider tells you not to drink.  If you drink alcohol, limit how much you have: ? 0-1 drink a day for women. ? 0-2 drinks a day for  men.  Be aware of how much alcohol is in your drink. In the [place] one drink equals one typical bottle of beer (12 oz), one-half glass of wine (5 oz), or one shot of hard liquor (1 oz).  Do not use any products that contain nicotine or tobacco, such as cigarettes and e-cigarettes. If you need help quitting, ask your health care provider. Summary  Having a healthy lifestyle and getting preventive care can help to protect your health and wellness after age 60.  Screening and testing are the best way to find a health problem early and help you avoid having a fall. Early diagnosis and treatment give you the best chance for managing medical conditions that are more common for people who are older than age 63.  Falls are a major cause of broken bones and head injuries in people who are older than age 39. Take precautions to prevent a fall at home.  Work with your health care provider to learn what changes you can make to improve your health and wellness and to prevent falls. This information is not intended to replace advice given to you by your health care provider. Make sure you discuss any questions you have with your health care provider. Document Released: 03/17/2017 Document Revised: 08/25/2018 Document Reviewed: 03/17/2017 Elsevier Patient Education  2020 [employer].

## 2019-01-18 NOTE — Progress Notes (Signed)
Patient ID: Jo White, female    DOB: [birth year]  Age: 68 y.o. MRN: [id number]  The patient is here for annual preventive examination and management of other chronic and acute problems.   Colonoscopy done 2018 Mammogram July 2019; reordered  The risk factors are reflected in the social history.  The roster of all physicians providing medical care to patient - is listed in the Snapshot section of the chart.  Activities of daily living:  The patient is 100% independent in all ADLs: dressing, toileting, feeding as well as independent mobility  Home safety : The patient has smoke detectors in the home. They wear seatbelts.  There are no firearms at home. There is no violence in the home.   There is no risks for hepatitis, STDs or HIV. There is no   history of blood transfusion. They have no travel history to infectious disease endemic areas of the world.  The patient has seen their dentist in the last six month. They have seen their eye doctor in the last year. They admit to slight hearing difficulty with regard to whispered voices and some television programs.  They have deferred audiologic testing in the last year.  They do not  have excessive sun exposure. Discussed the need for sun protection: hats, long sleeves and use of sunscreen if there is significant sun exposure.   Diet: the importance of a healthy diet is discussed. They do have a healthy diet.  The benefits of regular aerobic exercise were discussed. She walks 4 times per week ,  20 minutes.   Depression screen: there are no signs or vegative symptoms of depression- irritability, change in appetite, anhedonia, sadness/tearfullness.  Cognitive assessment: the patient manages all their financial and personal affairs and is actively engaged. They could relate day,date,year and events; recalled 2/3 objects at 3 minutes; performed clock-face test normally.  The following portions of the patient's history were reviewed and updated as  appropriate: allergies, current medications, past family history, past medical history,  past surgical history, past social history  and problem list.  Visual acuity was not assessed per patient preference since she has regular follow up with her ophthalmologist. Hearing and body mass index were assessed and reviewed.   During the course of the visit the patient was educated and counseled about appropriate screening and preventive services including : fall prevention , diabetes screening, nutrition counseling, colorectal cancer screening, and recommended immunizations.    CC: Diagnoses of Need for immunization against influenza, Class 1 obesity due to excess calories without serious comorbidity with body mass index (BMI) of 33.0 to 33.9 in adult, Essential hypertension, Hyperlipidemia LDL goal <100, History of bladder cancer, and Breast cancer screening were pertinent to this visit.  Obesity: Trying to lose weight. Not exercising due to covid, but states that she is physically active with daily care of  3 grandsons who are  toddlers and 5 yrs of age    58 due   [place] of brca (her sister  is Hinton Dyer)   History Deaira has a past medical history of Anemia, Blood in stool, Cancer ([place]), Complication of anesthesia, External hemorrhoids without mention of complication, Internal hemorrhoids with other complication, Otalgia, unspecified, Personal history of unspecified circulatory disease, PONV (postoperative nausea and vomiting), Swelling, mass, or lump in head and neck, and UTI (urinary tract infection) (07-2015).   She has a past surgical history that includes fallopian tube removal; Appendectomy; in vitro fertilization (1985); Breast biopsy; Hemorrhoid surgery (11/2006); Diagnostic laparoscopy;  Colonoscopy; Transurethral resection of bladder tumor (N/A, 08/06/2015); and Cystoscopy.   Her family history includes Bradycardia in her mother; Breast cancer in her sister; Cancer in her sister; Colon  polyps in her mother; Diabetes in her paternal grandmother, sister, and sister; Diverticulosis in her mother; Heart attack in her father; Heart disease in her mother; Heart disease (age of onset: 58) in her father; Heart failure in her mother; Hyperlipidemia in her sister; Hypertension in her brother and mother; Skin cancer in her sister.She reports that she quit smoking about 47 years ago. Her smoking use included cigarettes. She has a 1.50 pack-year smoking history. She has never used smokeless tobacco. She reports current alcohol use. She reports that she does not use drugs.  Outpatient Medications Prior to Visit  Medication Sig Dispense Refill  . cyanocobalamin (,VITAMIN B-12,) 1000 MCG/ML injection Inject 1 mL (1,000 mcg total) into the muscle once a week. For 3 weeks,  Then monthly 10 mL 3  . levofloxacin (LEVAQUIN) 500 MG tablet Take 500 mg by mouth daily.    . Vitamin D, Ergocalciferol, (DRISDOL) 1.25 MG (50000 UT) CAPS capsule TAKE 1 CAPSULE EVERY WEEK 12 capsule 0   Facility-Administered Medications Prior to Visit  Medication Dose Route Frequency Provider Last Rate Last Dose  . 0.9 %  sodium chloride infusion  500 mL Intravenous Continuous Milus Banister, MD        Review of Systems  Patient denies headache, fevers, malaise, unintentional weight loss, skin rash, eye pain, sinus congestion and sinus pain, sore throat, dysphagia,  hemoptysis , cough, dyspnea, wheezing, chest pain, palpitations, orthopnea, edema, abdominal pain, nausea, melena, diarrhea, constipation, flank pain, dysuria, hematuria, urinary  Frequency, nocturia, numbness, tingling, seizures,  Focal weakness, Loss of consciousness,  Tremor, insomnia, depression, anxiety, and suicidal ideation.     Objective:  BP (!) 142/86 (BP Location: Left Arm, Patient Position: Sitting, Cuff Size: Large)   Pulse 77   Temp (!) 97.3 F (36.3 C) (Temporal)   Resp 15   Ht 5' 7"  (1.702 m)   Wt 219 lb 12.8 oz (99.7 kg)   SpO2 98%    BMI 34.43 kg/m   Physical Exam   General appearance: alert, cooperative and appears stated age Head: Normocephalic, without obvious abnormality, atraumatic Eyes: conjunctivae/corneas clear. PERRL, EOM's intact. Fundi benign. Ears: normal TM's and external ear canals both ears Nose: Nares normal. Septum midline. Mucosa normal. No drainage or sinus tenderness. Throat: lips, mucosa, and tongue normal; teeth and gums normal Neck: no adenopathy, no carotid bruit, no JVD, supple, symmetrical, trachea midline and thyroid not enlarged, symmetric, no tenderness/mass/nodules Lungs: clear to auscultation bilaterally Breasts: normal appearance, no masses or tenderness Heart: regular rate and rhythm, S1, S2 normal, no murmur, click, rub or gallop Abdomen: soft, non-tender; bowel sounds normal; no masses,  no organomegaly Extremities: extremities normal, atraumatic, no cyanosis or edema Pulses: 2+ and symmetric Skin: Skin color, texture, turgor normal. No rashes or lesions Neurologic: Alert and oriented X 3, normal strength and tone. Normal symmetric reflexes. Normal coordination and gait.      Assessment & Plan:   Problem List Items Addressed This Visit      Unprioritized   Hyperlipidemia LDL goal <100    10 yr risk is 14 %  based on  2018 lipid profile. She prefers to avoid statins but has a strong FH .  Wt loss and Mediterranean low GI diet recommended.  Continue aspirin.         Breast  cancer screening    Annual comprehensive preventive exam was done as well as an evaluation and management of chronic conditions .  During the course of the visit the patient was educated and counseled about appropriate screening and preventive services including :  diabetes screening, lipid analysis with projected  10 year  risk for CAD , nutrition counseling, breast, cervical and colorectal cancer screening, and recommended immunizations.  Printed recommendations for health maintenance screenings was given       Obesity    I have addressed  BMI and recommended wt loss of 10% of body weigh over the next 6 months using a low glycemic index diet and regular exercise a minimum of 5 days per week.        History of bladder cancer    Annual follow up with cystoscopy by [facility] last evaluation wasa April 2019.  Reminded to schedule follow up       Essential hypertension    Diagnosed today as her last 4 readings are elevated.  Recommend starting hctz 25 mg daily and returning for BP check/BMEt after one week        Other Visit Diagnoses    Need for immunization against influenza       Relevant Orders   Flu Vaccine QUAD High Dose(Fluad) (Completed)    A total of 40 minutes of face to face time was spent with patient more than half of which was spent in counselling about the above mentioned conditions  and coordination of care   I am having [name] maintain her levofloxacin, Vitamin D (Ergocalciferol), and cyanocobalamin. We will continue to administer sodium chloride.  Meds ordered this encounter  Medications  . Vitamin D, Ergocalciferol, (DRISDOL) 1.25 MG (50000 UT) CAPS capsule    Sig: TAKE 1 CAPSULE EVERY WEEK    Dispense:  12 capsule    Refill:  0  . cyanocobalamin (,VITAMIN B-12,) 1000 MCG/ML injection    Sig: Inject 1 mL (1,000 mcg total) into the muscle once a week. For 3 weeks,  Then monthly    Dispense:  10 mL    Refill:  3    Medications Discontinued During This Encounter  Medication Reason  . Vitamin D, Ergocalciferol, (DRISDOL) 1.25 MG (50000 UT) CAPS capsule Completed Course  . cyanocobalamin (,VITAMIN B-12,) 1000 MCG/ML injection Reorder    Follow-up: Return in about 1 year (around 01/18/2020).   Crecencio Mc, MD

## 2019-01-21 ENCOUNTER — Telehealth: Payer: Self-pay | Admitting: Internal Medicine

## 2019-01-21 DIAGNOSIS — I10 Essential (primary) hypertension: Secondary | ICD-10-CM | POA: Insufficient documentation

## 2019-01-21 NOTE — Assessment & Plan Note (Addendum)
Annual follow up with cystoscopy by [facility] last evaluation wasa April 2019.  Reminded to schedule follow up

## 2019-01-21 NOTE — Telephone Encounter (Signed)
I failed to address her elevated blood pressure during her visit.   She has untreated hypertension,  The last 4 visits have been elevated,  If she is willing to start medication I will send hctz to her pharmacy.  Then  iwould like her to return after a week  For a bp check. .  Her diagnosis of hypertension also raises her risk for heart attach over the nenxt ten years to 14%. I would like her to schedule a 6 month follow up and we will repeat labs priro to visit  And discuss therapy if she has not been able to lower her LDL to < 100 (currently 126)

## 2019-01-21 NOTE — Assessment & Plan Note (Signed)
Annual comprehensive preventive exam was done as well as an evaluation and management of chronic conditions .  During the course of the visit the patient was educated and counseled about appropriate screening and preventive services including :  diabetes screening, lipid analysis with projected  10 year  risk for CAD , nutrition counseling, breast, cervical and colorectal cancer screening, and recommended immunizations.  Printed recommendations for health maintenance screenings was given 

## 2019-01-21 NOTE — Assessment & Plan Note (Signed)
I have addressed  BMI and recommended wt loss of 10% of body weigh over the next 6 months using a low glycemic index diet and regular exercise a minimum of 5 days per week.   

## 2019-01-21 NOTE — Assessment & Plan Note (Addendum)
Diagnosed today as her last 4 readings are elevated.  Recommend starting hctz 25 mg daily and returning for BP check/BMEt after one week

## 2019-01-21 NOTE — Assessment & Plan Note (Signed)
10 yr risk is 14 %  based on  2018 lipid profile. She prefers to avoid statins but has a strong FH .  Wt loss and Mediterranean low GI diet recommended.  Continue aspirin.

## 2019-01-25 MED ORDER — HYDROCHLOROTHIAZIDE 12.5 MG PO TABS: 12.5000 mg | ORAL_TABLET | Freq: Every day | ORAL | 1 refills | Status: DC

## 2019-01-25 NOTE — Telephone Encounter (Signed)
Spoke with pt and informed her of Dr. Tullo's message below. Pt gave a verbal understanding.  

## 2019-01-25 NOTE — Telephone Encounter (Signed)
Spoke with pt and she stated that she is willing to start the HCTZ. However pt stated that she is going to be on vacation all next week and isn't sure if she should start the medication before she goes on vacation or after she returns. Pt is also wanting to know if there is any side effects that she should be aware of?

## 2019-01-25 NOTE — Telephone Encounter (Signed)
Medication sent to [facility].  tka daily in the morning,  Ok to wait until after vacation  Side effects:  increased urination . Needs to suspend during any type of GI illness (vomiting or diarrhea)  to prevent dehydration   Goal bp is 120/70 after one week of therapy  Needs bp check in office

## 2019-02-08 MED ORDER — HYDROCHLOROTHIAZIDE 12.5 MG PO TABS: 12.5000 mg | ORAL_TABLET | Freq: Every day | ORAL | 1 refills | Status: DC

## 2019-02-08 NOTE — Telephone Encounter (Signed)
Called and cancelled Rx for HCTZ at [facility].  Resent Rx to [facility].  Patient aware.  Patient was scheduled a f/u nurse visit to recheck bp on 02/20/19 @ 11:00 am.

## 2019-02-08 NOTE — Telephone Encounter (Signed)
Pt stated this medication is suppose to go to [facility].  Please resend to correct pharmacy

## 2019-02-20 ENCOUNTER — Ambulatory Visit (INDEPENDENT_AMBULATORY_CARE_PROVIDER_SITE_OTHER): Payer: PPO

## 2019-02-20 ENCOUNTER — Other Ambulatory Visit: Payer: Self-pay

## 2019-02-20 VITALS — BP 128/80 | HR 74

## 2019-02-20 DIAGNOSIS — Z1231 Encounter for screening mammogram for malignant neoplasm of breast: Secondary | ICD-10-CM | POA: Diagnosis not present

## 2019-02-20 DIAGNOSIS — I10 Essential (primary) hypertension: Secondary | ICD-10-CM

## 2019-02-20 LAB — HM MAMMOGRAPHY

## 2019-02-20 NOTE — Progress Notes (Addendum)
Patient here for nurse visit BP check per MD order from [place] visit 01/18/19.Jo White   Patient reports compliance with prescribed BP medications: YES  Last dose of BP medication: this morning at 8:00 am.  Patient denied having any symptoms.  Patient's bp first check was 142/80.  After rechecking bp in 10 mins bp was 128/80.    BP Readings from Last 3 Encounters:  02/20/19 (!) 142/80  01/18/19 (!) 142/86  04/20/18 (!) 187/82   Pulse Readings from Last 3 Encounters:  02/20/19 75  01/18/19 77  04/20/18 83    Dorise Bullion, CMA     I have reviewed the above information and agree with above.   contiueu current regimen   Deborra Medina, MD

## 2019-08-14 ENCOUNTER — Other Ambulatory Visit: Payer: Self-pay | Admitting: Internal Medicine

## 2019-09-04 DIAGNOSIS — Z8551 Personal history of malignant neoplasm of bladder: Secondary | ICD-10-CM | POA: Diagnosis not present

## 2019-09-04 IMAGING — US US EXTREM LOW VENOUS*R*
1 series · 14 of 24 positions shown · non-contrast
Comparison: None

CLINICAL DATA: Burning sensation, varicose/spider veins

EXAM:
RIGHT LOWER EXTREMITY VENOUS DOPPLER ULTRASOUND
TECHNIQUE: Gray-scale sonography with compression, as well as color and duplex
ultrasound, were performed to evaluate the deep venous system from
the level of the common femoral vein through the popliteal and
proximal calf veins.

[Series 1: us extrem low venous*right* · 0.08mm/px · 14 of 42 slices shown]
[im 1/42]
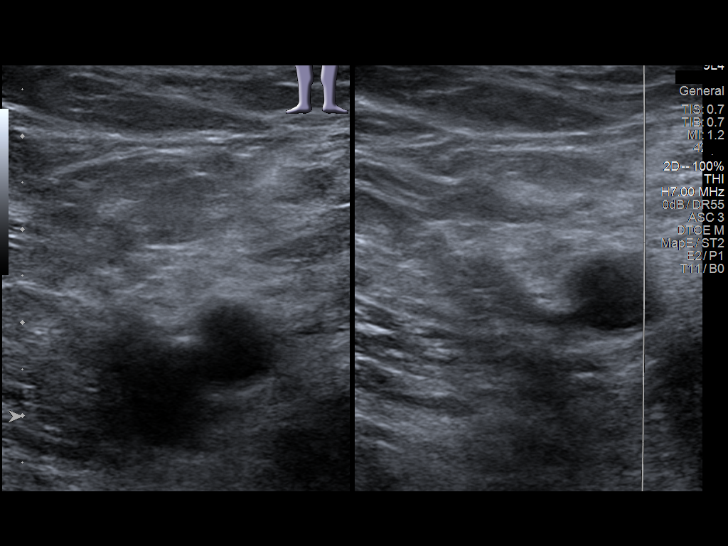
[im 4/42]
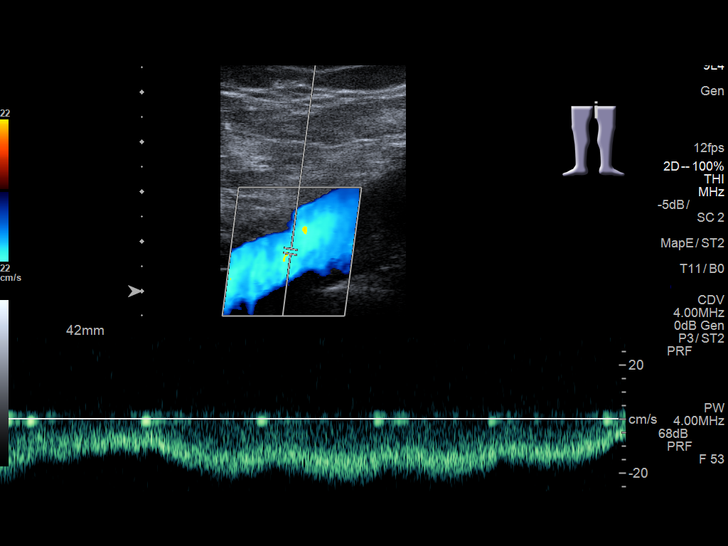
[im 8/42]
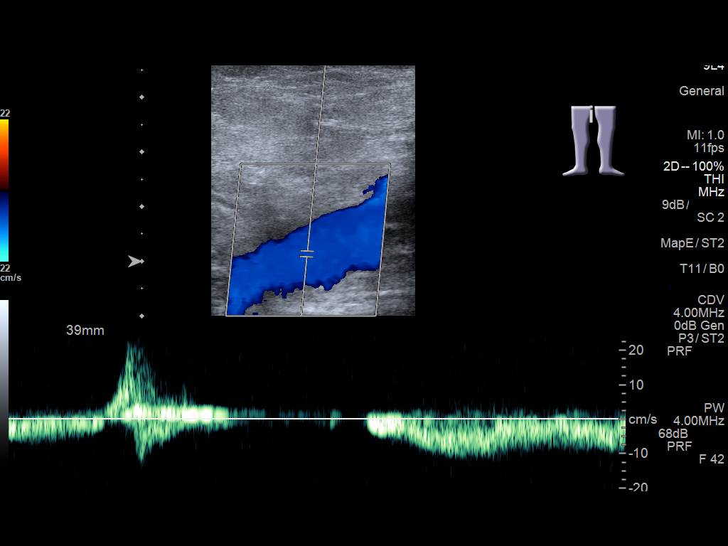
[im 11/42]
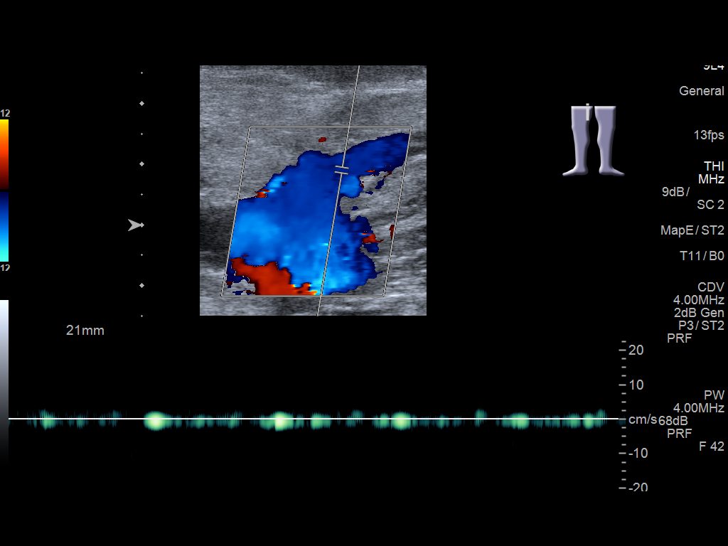
[im 13/42]
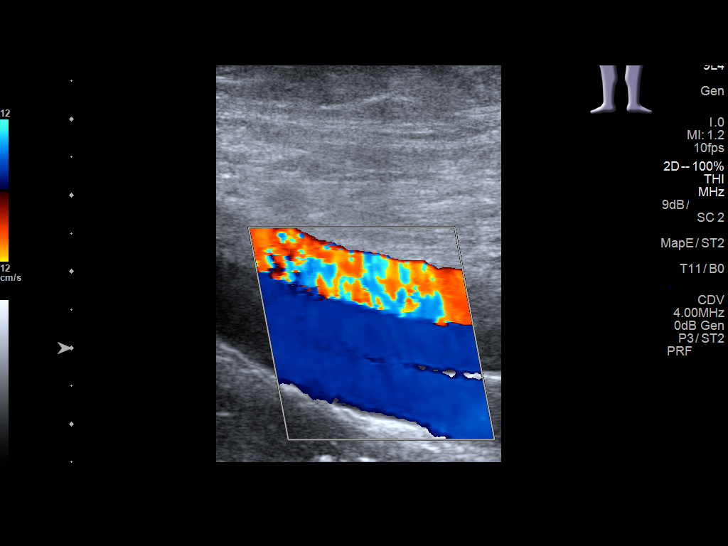
[im 17/42]
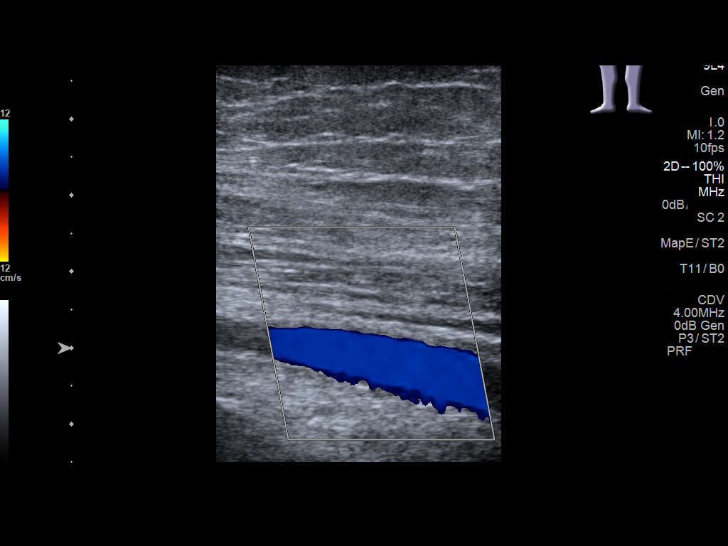
[im 20/42]
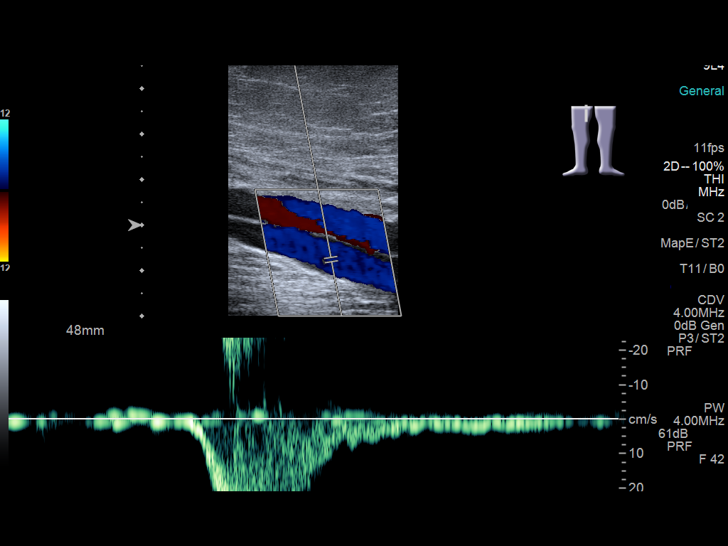
[im 22/42]
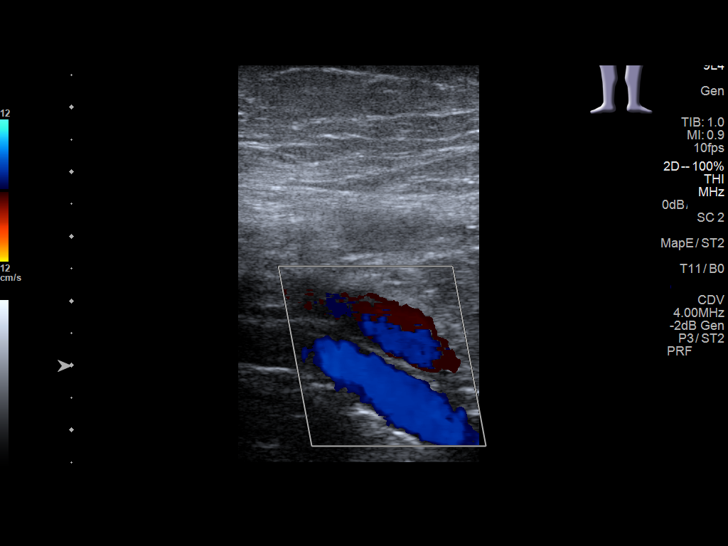
[im 25/42]
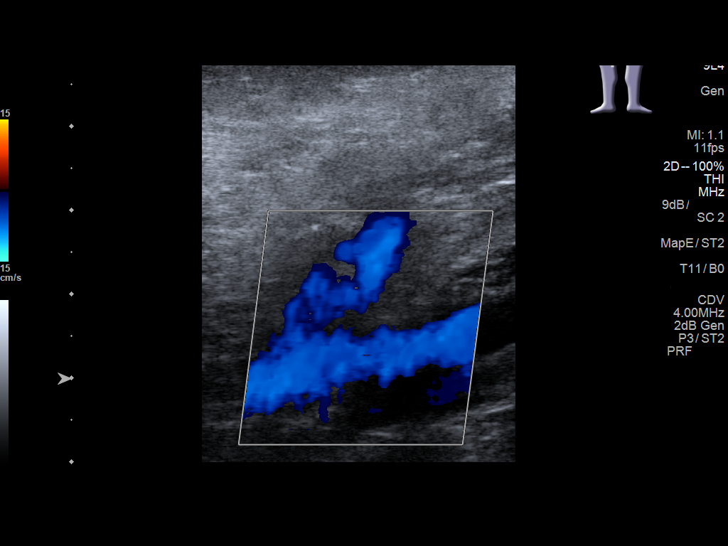
[im 29/42]
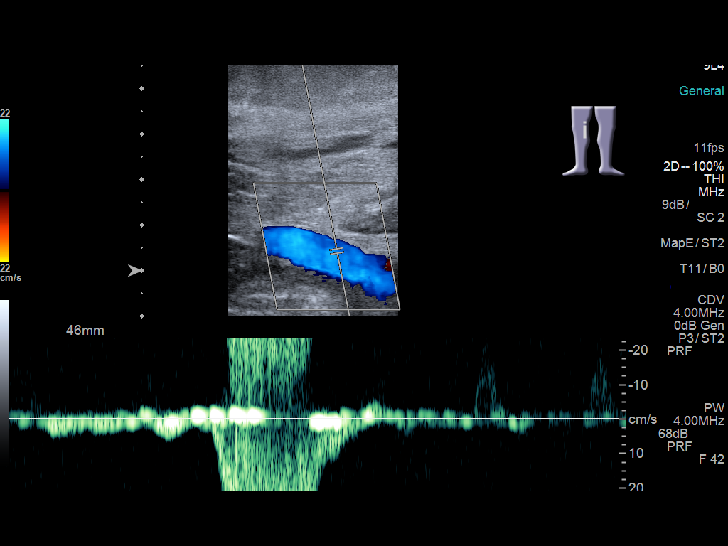
[im 33/42]
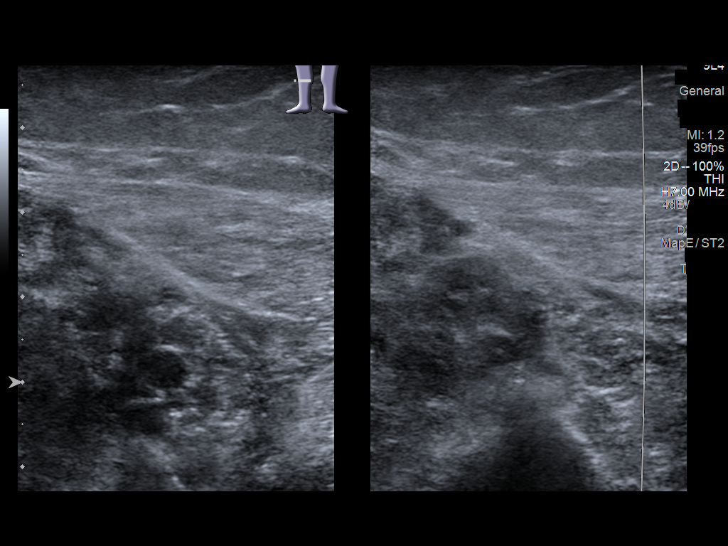
[im 34/42]
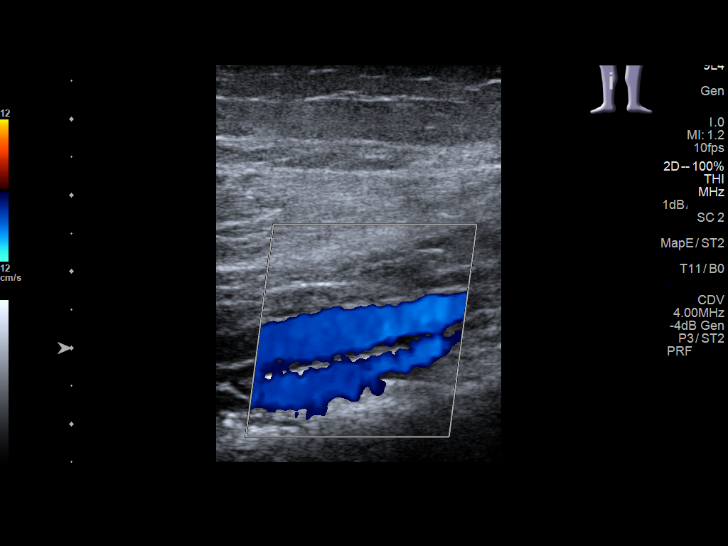
[im 38/42]
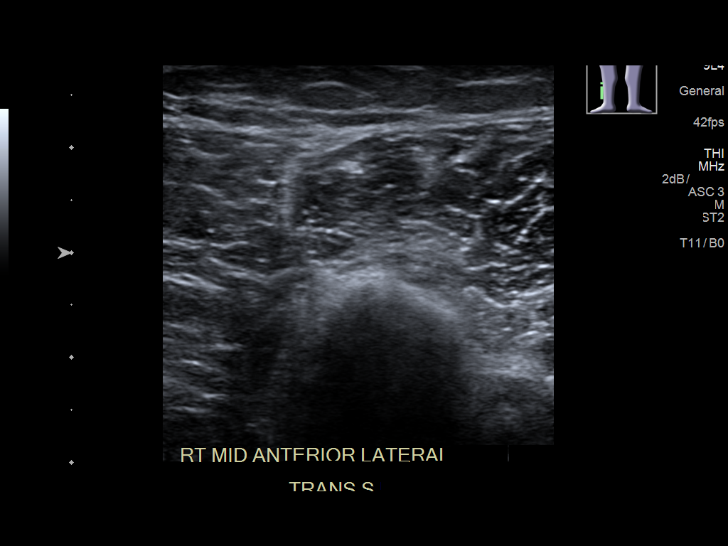
[im 42/42]
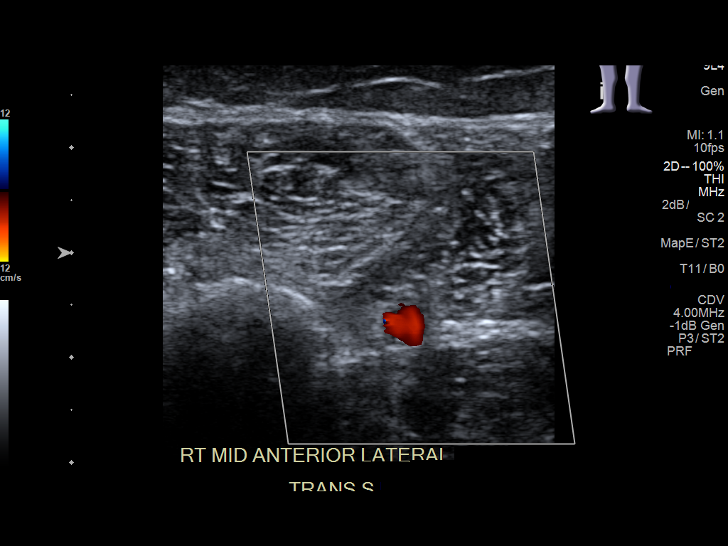

[14 of 24 positions shown; findings below may reference images not displayed]

FINDINGS: Normal compressibility of the common femoral, superficial femoral,
and popliteal veins, as well as the proximal calf veins. No filling
defects to suggest DVT on grayscale or color Doppler imaging.
Doppler waveforms show normal direction of venous flow, normal
respiratory phasicity and response to augmentation. Survey views of
the contralateral common femoral vein are unremarkable.
IMPRESSION: No femoropopliteal and no calf DVT in the visualized calf veins. If
clinical symptoms are inconsistent or if there are persistent or
worsening symptoms, further imaging (possibly involving the iliac
veins) may be warranted.

## 2019-09-28 DIAGNOSIS — L723 Sebaceous cyst: Secondary | ICD-10-CM | POA: Diagnosis not present

## 2019-09-28 DIAGNOSIS — L720 Epidermal cyst: Secondary | ICD-10-CM | POA: Diagnosis not present

## 2019-10-02 LAB — SURGICAL PATHOLOGY

## 2019-11-06 ENCOUNTER — Other Ambulatory Visit: Payer: Self-pay | Admitting: Internal Medicine

## 2019-11-07 ENCOUNTER — Encounter: Payer: PPO | Admitting: Dermatology

## 2019-11-14 DIAGNOSIS — R350 Frequency of micturition: Secondary | ICD-10-CM | POA: Diagnosis not present

## 2019-11-14 DIAGNOSIS — N390 Urinary tract infection, site not specified: Secondary | ICD-10-CM | POA: Diagnosis not present

## 2019-11-14 DIAGNOSIS — R351 Nocturia: Secondary | ICD-10-CM | POA: Diagnosis not present

## 2019-11-14 DIAGNOSIS — R3915 Urgency of urination: Secondary | ICD-10-CM | POA: Diagnosis not present

## 2019-11-27 ENCOUNTER — Other Ambulatory Visit: Payer: Self-pay

## 2019-11-27 ENCOUNTER — Ambulatory Visit: Payer: PPO | Admitting: Dermatology

## 2019-11-27 DIAGNOSIS — L578 Other skin changes due to chronic exposure to nonionizing radiation: Secondary | ICD-10-CM

## 2019-11-27 DIAGNOSIS — C44712 Basal cell carcinoma of skin of right lower limb, including hip: Secondary | ICD-10-CM | POA: Diagnosis not present

## 2019-11-27 DIAGNOSIS — C4491 Basal cell carcinoma of skin, unspecified: Secondary | ICD-10-CM

## 2019-11-27 DIAGNOSIS — D485 Neoplasm of uncertain behavior of skin: Secondary | ICD-10-CM

## 2019-11-27 HISTORY — DX: Basal cell carcinoma of skin, unspecified: C44.91

## 2019-11-27 NOTE — Patient Instructions (Signed)

## 2019-11-27 NOTE — Progress Notes (Signed)
   Follow-Up Visit   Subjective  Jo White is a 69 y.o. female who presents for the following: Other (Spot on right thigh x 4-5 months. Feels like a knot underneath.).  The following portions of the chart were reviewed this encounter and updated as appropriate:  Tobacco  Allergies  Meds  Problems  Med Hx  Surg Hx  Fam Hx     Review of Systems:  No other skin or systemic complaints except as noted in HPI or Assessment and Plan.  Objective  Well appearing patient in no apparent distress; mood and affect are within normal limits.  A focused examination was performed including right thigh. Relevant physical exam findings are noted in the Assessment and Plan.  Objective  Right ant lat thigh: 1.2 x 1.1 cm pink scaly patch   Assessment & Plan    Neoplasm of uncertain behavior of skin Right ant lat thigh  Skin / nail biopsy Type of biopsy: tangential   Informed consent: discussed and consent obtained   Timeout: patient name, date of birth, surgical site, and procedure verified   Procedure prep:  Patient was prepped and draped in usual sterile fashion Prep type:  Isopropyl alcohol Anesthesia: the lesion was anesthetized in a standard fashion   Anesthetic:  1% lidocaine w/ epinephrine 1-100,000 buffered w/ 8.4% NaHCO3 Instrument used: flexible razor blade   Hemostasis achieved with: pressure, aluminum chloride and electrodesiccation   Outcome: patient tolerated procedure well   Post-procedure details: sterile dressing applied and wound care instructions given   Dressing type: bandage and petrolatum    Specimen 1 - Surgical pathology Differential Diagnosis: BCC vs SCC in situ vs other Check Margins: No 1.2 x 1.1 cm pink scaly patch  Actinic Damage - diffuse scaly erythematous macules with underlying dyspigmentation - Recommend daily broad spectrum sunscreen SPF 30+ to sun-exposed areas, reapply every 2 hours as needed.  - Call for new or changing lesions.  Return for  TBSE as scheduled.   I, Ashok Cordia, CMA, am acting as scribe for Sarina Ser, MD . Documentation: I have reviewed the above documentation for accuracy and completeness, and I agree with the above.  Sarina Ser, MD  Documentation: I have reviewed the above documentation for accuracy and completeness, and I agree with the above.  Sarina Ser, MD

## 2019-11-28 ENCOUNTER — Encounter: Payer: Self-pay | Admitting: Dermatology

## 2019-11-29 ENCOUNTER — Telehealth: Payer: Self-pay

## 2019-11-29 NOTE — Telephone Encounter (Signed)
Patient informed of pathology results and appointment scheduled.  °

## 2019-11-29 NOTE — Telephone Encounter (Signed)
-----   Message from Ralene Bathe, MD sent at 11/28/2019  7:11 PM EDT ----- Skin , right ant lat thigh SUPERFICIAL BASAL CELL CARCINOMA  Cancer - BCC Superficial Schedule for treatment ([facility])

## 2019-12-19 ENCOUNTER — Other Ambulatory Visit: Payer: Self-pay

## 2019-12-19 ENCOUNTER — Encounter: Payer: Self-pay | Admitting: Dermatology

## 2019-12-19 ENCOUNTER — Ambulatory Visit: Payer: PPO | Admitting: Dermatology

## 2019-12-19 DIAGNOSIS — L578 Other skin changes due to chronic exposure to nonionizing radiation: Secondary | ICD-10-CM

## 2019-12-19 DIAGNOSIS — C44712 Basal cell carcinoma of skin of right lower limb, including hip: Secondary | ICD-10-CM | POA: Diagnosis not present

## 2019-12-19 NOTE — Progress Notes (Signed)
   Follow-Up Visit   Subjective  Jo White is a 69 y.o. female who presents for the following: BCC bx proven (R ant lat thigh, pt presents for treatment).  The following portions of the chart were reviewed this encounter and updated as appropriate:  Tobacco  Allergies  Meds  Problems  Med Hx  Surg Hx  Fam Hx     Review of Systems:  No other skin or systemic complaints except as noted in HPI or Assessment and Plan.  Objective  Well appearing patient in no apparent distress; mood and affect are within normal limits.  A focused examination was performed including R leg. Relevant physical exam findings are noted in the Assessment and Plan.  Objective  Right anterior lateral thigh: Pink bx site   Assessment & Plan  Basal cell carcinoma (BCC) of skin of right lower extremity including hip Right anterior lateral thigh  Destruction of lesion Complexity: extensive   Destruction method: electrodesiccation and curettage   Informed consent: discussed and consent obtained   Timeout:  patient name, date of birth, surgical site, and procedure verified Procedure prep:  Patient was prepped and draped in usual sterile fashion Prep type:  Isopropyl alcohol Anesthesia: the lesion was anesthetized in a standard fashion   Anesthetic:  1% lidocaine w/ epinephrine 1-100,000 buffered w/ 8.4% NaHCO3 Curettage performed in three different directions: Yes   Electrodesiccation performed over the curetted area: Yes   Lesion length (cm):  1.2 Lesion width (cm):  1.2 Margin per side (cm):  0.2 Final wound size (cm):  1.6 Hemostasis achieved with:  pressure, aluminum chloride and electrodesiccation Outcome: patient tolerated procedure well with no complications   Post-procedure details: sterile dressing applied and wound care instructions given   Dressing type: bandage and petrolatum    Actinic Damage - diffuse scaly erythematous macules with underlying dyspigmentation - Recommend daily broad  spectrum sunscreen SPF 30+ to sun-exposed areas, reapply every 2 hours as needed.  - Call for new or changing lesions.  Return for As scheduled on 02/15/20 for TBSE.   I, Othelia Pulling, RMA, am acting as scribe for Sarina Ser, MD .  Documentation: I have reviewed the above documentation for accuracy and completeness, and I agree with the above.  Sarina Ser, MD

## 2019-12-19 NOTE — Patient Instructions (Signed)

## 2019-12-20 ENCOUNTER — Encounter: Payer: Self-pay | Admitting: Dermatology

## 2019-12-22 ENCOUNTER — Ambulatory Visit (INDEPENDENT_AMBULATORY_CARE_PROVIDER_SITE_OTHER): Payer: PPO

## 2019-12-22 VITALS — Ht 67.0 in | Wt 219.0 lb

## 2019-12-22 DIAGNOSIS — Z0000 Encounter for general adult medical examination without abnormal findings: Secondary | ICD-10-CM

## 2019-12-22 NOTE — Progress Notes (Signed)
Subjective:   Jo White is a 69 y.o. female who presents for Medicare Annual (Subsequent) preventive examination.  Review of Systems    No ROS.  Medicare Wellness Virtual Visit.   Cardiac Risk Factors include: advanced age (>16men, >77 women);hypertension     Objective:    Today's Vitals   12/22/19 0933  Weight: 219 lb (99.3 kg)  Height: 5\' 7"  (1.702 m)   Body mass index is 34.3 kg/m.  Advanced Directives 12/22/2019 12/21/2018 04/20/2018 09/18/2016 09/04/2016 08/06/2015  Does Patient Have a Medical Advance Directive? No No No No No No  Would patient like information on creating a medical advance directive? No - Patient declined Yes (MAU/Ambulatory/Procedural Areas - Information given) No - Patient declined - - No - patient declined information    Current Medications (verified) Outpatient Encounter Medications as of 12/22/2019  Medication Sig  . cyanocobalamin (,VITAMIN B-12,) 1000 MCG/ML injection Inject 1 mL (1,000 mcg total) into the muscle once a week. For 3 weeks,  Then monthly  . hydrochlorothiazide (HYDRODIURIL) 12.5 MG tablet TAKE ONE TABLET EVERY DAY  . levofloxacin (LEVAQUIN) 500 MG tablet Take 500 mg by mouth daily.  . Vitamin D, Ergocalciferol, (DRISDOL) 1.25 MG (50000 UT) CAPS capsule TAKE 1 CAPSULE EVERY WEEK   Facility-Administered Encounter Medications as of 12/22/2019  Medication  . 0.9 %  sodium chloride infusion    Allergies (verified) Patient has no known allergies.   History: Past Medical History:  Diagnosis Date  . Anemia   . Basal cell carcinoma 12/19/2019   R ant lat thigh  . Blood in stool   . Cancer ([place])    bladder  . Complication of anesthesia   . External hemorrhoids without mention of complication   . Internal hemorrhoids with other complication   . Otalgia, unspecified   . Personal history of unspecified circulatory disease   . PONV (postoperative nausea and vomiting)   . Squamous cell carcinoma of skin 12/06/2017   L ant thigh   .  Squamous cell carcinoma of skin 12/06/2017   R mid lat pretibial  . Swelling, mass, or lump in head and neck   . UTI (urinary tract infection) 07-2015   Past Surgical History:  Procedure Laterality Date  . APPENDECTOMY    . BREAST BIOPSY     right  . COLONOSCOPY    . CYSTOSCOPY    . DIAGNOSTIC LAPAROSCOPY     MULITPLE FOR IN VITRO  . fallopian tube removal     r tube  . HEMORRHOID SURGERY  11/2006  . in vitro fertilization  1985  . TRANSURETHRAL RESECTION OF BLADDER TUMOR N/A 08/06/2015   Procedure: TRANSURETHRAL RESECTION OF BLADDER TUMOR (TURBT);  Surgeon: Royston Cowper, MD;  Location: [facility] ORS;  Service: Urology;  Laterality: N/A;   Family History  Problem Relation Age of Onset  . Heart disease Father 48       secodnary to Bright's disease  . Heart attack Father   . Hypertension Mother   . Bradycardia Mother   . Heart failure Mother   . Colon polyps Mother   . Diverticulosis Mother   . Heart disease Mother   . Diabetes Sister   . Hyperlipidemia Sister   . Cancer Sister        breast  . Breast cancer Sister   . Hypertension Brother   . Diabetes Paternal Grandmother   . Diabetes Sister   . Skin cancer Sister   . Colon cancer Neg Hx  Social History   Socioeconomic History  . Marital status: Married    Spouse name: Not on file  . Number of children: 2  . Years of education: Not on file  . Highest education level: Not on file  Occupational History  . Occupation: [employer]    Employer: [employer]  Tobacco Use  . Smoking status: Former Smoker    Packs/day: 1.00    Years: 1.50    Pack years: 1.50    Types: Cigarettes    Quit date: 01/11/1972    Years since quitting: 47.9  . Smokeless tobacco: Never Used  . Tobacco comment: 1 1/2 years as a teen  Vaping Use  . Vaping Use: Never used  Substance and Sexual Activity  . Alcohol use: Yes    Comment: rare alcohol intake  . Drug use: No  . Sexual activity: Not on file  Other Topics Concern  . Not on file    Social History Narrative   No regular exercise, some walking   Diet- some fruit and veggies, avoid fast food   Social Determinants of Health   Financial Resource Strain: Low Risk   . Difficulty of Paying Living Expenses: Not hard at all  Food Insecurity: No Food Insecurity  . Worried About Charity fundraiser in the Last Year: Never true  . Ran Out of Food in the Last Year: Never true  Transportation Needs: No Transportation Needs  . Lack of Transportation (Medical): No  . Lack of Transportation (Non-Medical): No  Physical Activity: Sufficiently Active  . Days of Exercise per Week: 5 days  . Minutes of Exercise per Session: 30 min  Stress: No Stress Concern Present  . Feeling of Stress : Not at all  Social Connections:   . Frequency of Communication with Friends and Family:   . Frequency of Social Gatherings with Friends and Family:   . Attends Religious Services:   . Active Member of Clubs or Organizations:   . Attends Archivist Meetings:   Marland Kitchen Marital Status:     Tobacco Counseling Counseling given: Not Answered Comment: 1 1/2 years as a teen   Clinical Intake:  Pre-visit preparation completed: Yes        Diabetes: No  How often do you need to have someone help you when you read instructions, pamphlets, or other written materials from your doctor or pharmacy?: 1 - Never   Interpreter Needed?: No      Activities of Daily Living In your present state of health, do you have any difficulty performing the following activities: 12/22/2019  Hearing? N  Vision? N  Difficulty concentrating or making decisions? N  Walking or climbing stairs? N  Dressing or bathing? N  Doing errands, shopping? N  Preparing Food and eating ? N  Using the Toilet? N  In the past six months, have you accidently leaked urine? N  Do you have problems with loss of bowel control? N  Managing your Medications? N  Managing your Finances? N  Housekeeping or managing your  Housekeeping? N  Some recent data might be hidden    Patient Care Team: Crecencio Mc, MD as PCP - General (Internal Medicine)  Indicate any recent Medical Services you may have received from other than [facility] providers in the past year (date may be approximate).     Assessment:   This is a routine wellness examination for Jury.  I connected with Natalyah today by telephone and verified that I am  speaking with the correct person using two identifiers. Location patient: home Location provider: work Persons participating in the virtual visit: patient, Marine scientist.    I discussed the limitations, risks, security and privacy concerns of performing an evaluation and management service by telephone and the availability of in person appointments. The patient expressed understanding and verbally consented to this telephonic visit.    Interactive audio and video telecommunications were attempted between this provider and patient, however failed, due to patient having technical difficulties OR patient did not have access to video capability.  We continued and completed visit with audio only.  Some vital signs may be absent or patient reported.   Hearing/Vision screen  Hearing Screening   125Hz  250Hz  500Hz  1000Hz  2000Hz  3000Hz  4000Hz  6000Hz  8000Hz   Right ear:           Left ear:           Comments: Patient is able to hear conversational tones without difficulty.  No issues reported.   Vision Screening Comments: Followed by [facility], Dr. Valma Cava Wears corrective lenses Visual acuity not assessed, virtual visit.  They have seen their ophthalmologist in the last 12 months.    Dietary issues and exercise activities discussed: Current Exercise Habits: Home exercise routine, Type of exercise: walking, Intensity: Mild  Healthy diet Good water intake  Goals      Patient Stated   .  DIET - REDUCE PORTION SIZE (pt-stated)      Lose a little weight    .  Follow up with Primary Care Provider  (pt-stated)      Depression Screen PHQ 2/9 Scores 12/22/2019 12/21/2018 12/07/2017 05/29/2016  PHQ - 2 Score 0 0 0 0  PHQ- 9 Score - - 3 -    Fall Risk Fall Risk  12/22/2019 01/18/2019 12/21/2018 12/07/2017 05/29/2016  Falls in the past year? 0 0 0 No No  Number falls in past yr: 0 - - - -  Follow up Falls evaluation completed Falls evaluation completed - - -   Handrails in use when climbing stairs? Yes  Home free of loose throw rugs in walkways, pet beds, electrical cords, etc? Yes  Adequate lighting in your home to reduce risk of falls? Yes   ASSISTIVE DEVICES IN THE HOME TO PREVENT FALLS:  Life alert? No  Use of a cane, walker or w/c? No  Grab bars in the bathroom? No  Shower chair or bench in shower? Yes  Elevated toilet seat or a handicapped toilet? No   TIMED UP AND GO: Was the test performed? No .   Cognitive Function: MMSE - Mini Mental State Exam 12/22/2019  Not completed: Unable to complete  Patient is alert and oriented x3.  Denies difficulty focusing, making decisions, memory loss.   6CIT Screen 12/21/2018  What Year? 0 points  What month? 0 points  What time? 0 points  Count back from 20 0 points  Months in reverse 0 points  Repeat phrase 0 points  Total Score 0    Immunizations Immunization History  Administered Date(s) Administered  . Fluad Quad(high Dose 65+) 01/18/2019  . Influenza Split 02/23/2013, 02/15/2014  . Influenza Whole 01/17/2008  . Influenza, High Dose Seasonal PF 03/15/2018  . Influenza-Unspecified 02/15/2014, 03/21/2016, 02/22/2017  . Td 07/02/2008  . Tdap 11/23/2011   Pneumococcal vaccine- discussed. Patient understands this vaccine can be received from local pharmacy, health department, or in office. Update all immunization record if completed out of the office.   Covid vaccine-  Pfizer completed. Agrees to update immunization record.   Health Maintenance Health Maintenance  Topic Date Due  . COVID-19 Vaccine (1) Never done  . PNA vac Low  Risk Adult (1 of 2 - PCV13) Never done  . INFLUENZA VACCINE  12/17/2019  . MAMMOGRAM  02/20/2020  . TETANUS/TDAP  11/22/2021  . COLONOSCOPY  09/19/2026  . DEXA SCAN  Completed  . Hepatitis C Screening  Completed    Dental Screening: Recommended annual dental exams for proper oral hygiene  Community Resource Referral / Chronic Care Management: CRR required this visit?  No   CCM required this visit?  No      Plan:   Keep all routine maintenance appointments.   Follow up 01/23/20 @ 1:00  I have personally reviewed and noted the following in the patient's chart:   . Medical and social history . Use of alcohol, tobacco or illicit drugs  . Current medications and supplements . Functional ability and status . Nutritional status . Physical activity . Advanced directives . List of other physicians . Hospitalizations, surgeries, and ER visits in previous 12 months . Vitals . Screenings to include cognitive, depression, and falls . Referrals and appointments  In addition, I have reviewed and discussed with patient certain preventive protocols, quality metrics, and best practice recommendations. A written personalized care plan for preventive services as well as general preventive health recommendations were provided to patient via mychart.     Varney Biles, LPN   11/18/4191

## 2019-12-22 NOTE — Patient Instructions (Addendum)
[name] , Thank you for taking time to come for your Medicare Wellness Visit. I appreciate your ongoing commitment to your health goals. Please review the following plan we discussed and let me know if I can assist you in the future.   These are the goals we discussed: Goals      Patient Stated   .  DIET - REDUCE PORTION SIZE (pt-stated)      Lose a little weight    .  Follow up with Primary Care Provider (pt-stated)       This is a list of the screening recommended for you and due dates:  Health Maintenance  Topic Date Due  . COVID-19 Vaccine (1) Never done  . Pneumonia vaccines (1 of 2 - PCV13) Never done  . Flu Shot  12/17/2019  . Mammogram  02/20/2020  . Tetanus Vaccine  11/22/2021  . Colon Cancer Screening  09/19/2026  . DEXA scan (bone density measurement)  Completed  .  Hepatitis C: One time screening is recommended by Center for Disease Control  (CDC) for  adults born from [birth year] through 1964.   Completed    Immunizations Immunization History  Administered Date(s) Administered  . Fluad Quad(high Dose 65+) 01/18/2019  . Influenza Split 02/23/2013, 02/15/2014  . Influenza Whole 01/17/2008  . Influenza, High Dose Seasonal PF 03/15/2018  . Influenza-Unspecified 02/15/2014, 03/21/2016, 02/22/2017  . Td 07/02/2008  . Tdap 11/23/2011   Keep all routine maintenance appointments.   Follow up 01/23/20 @ 1:00  Advanced directives: declined  Conditions/risks identified: none new  Follow up in one year for your annual wellness visit   Preventive Care 65 Years and Older, Female Preventive care refers to lifestyle choices and visits with your health care provider that can promote health and wellness. What does preventive care include?  A yearly physical exam. This is also called an annual well check.  Dental exams once or twice a year.  Routine eye exams. Ask your health care provider how often you should have your eyes checked.  Personal lifestyle choices,  including:  Daily care of your teeth and gums.  Regular physical activity.  Eating a healthy diet.  Avoiding tobacco and drug use.  Limiting alcohol use.  Practicing safe sex.  Taking low-dose aspirin every day.  Taking vitamin and mineral supplements as recommended by your health care provider. What happens during an annual well check? The services and screenings done by your health care provider during your annual well check will depend on your age, overall health, lifestyle risk factors, and family history of disease. Counseling  Your health care provider may ask you questions about your:  Alcohol use.  Tobacco use.  Drug use.  Emotional well-being.  Home and relationship well-being.  Sexual activity.  Eating habits.  History of falls.  Memory and ability to understand (cognition).  Work and work Statistician.  Reproductive health. Screening  You may have the following tests or measurements:  Height, weight, and BMI.  Blood pressure.  Lipid and cholesterol levels. These may be checked every 5 years, or more frequently if you are over 72 years old.  Skin check.  Lung cancer screening. You may have this screening every year starting at age 52 if you have a 30-pack-year history of smoking and currently smoke or have quit within the past 15 years.  Fecal occult blood test (FOBT) of the stool. You may have this test every year starting at age 36.  Flexible sigmoidoscopy or colonoscopy. You  may have a sigmoidoscopy every 5 years or a colonoscopy every 10 years starting at age 70.  Hepatitis C blood test.  Hepatitis B blood test.  Sexually transmitted disease (STD) testing.  Diabetes screening. This is done by checking your blood sugar (glucose) after you have not eaten for a while (fasting). You may have this done every 1-3 years.  Bone density scan. This is done to screen for osteoporosis. You may have this done starting at age 33.  Mammogram. This  may be done every 1-2 years. Talk to your health care provider about how often you should have regular mammograms. Talk with your health care provider about your test results, treatment options, and if necessary, the need for more tests. Vaccines  Your health care provider may recommend certain vaccines, such as:  Influenza vaccine. This is recommended every year.  Tetanus, diphtheria, and acellular pertussis (Tdap, Td) vaccine. You may need a Td booster every 10 years.  Zoster vaccine. You may need this after age 5.  Pneumococcal 13-valent conjugate (PCV13) vaccine. One dose is recommended after age 84.  Pneumococcal polysaccharide (PPSV23) vaccine. One dose is recommended after age 104. Talk to your health care provider about which screenings and vaccines you need and how often you need them. This information is not intended to replace advice given to you by your health care provider. Make sure you discuss any questions you have with your health care provider. Document Released: 05/31/2015 Document Revised: 01/22/2016 Document Reviewed: 03/05/2015 Elsevier Interactive Patient Education  2017 [place] Prevention in the Home Falls can cause injuries. They can happen to people of all ages. There are many things you can do to make your home safe and to help prevent falls. What can I do on the outside of my home?  Regularly fix the edges of walkways and driveways and fix any cracks.  Remove anything that might make you trip as you walk through a door, such as a raised step or threshold.  Trim any bushes or trees on the path to your home.  Use bright outdoor lighting.  Clear any walking paths of anything that might make someone trip, such as rocks or tools.  Regularly check to see if handrails are loose or broken. Make sure that both sides of any steps have handrails.  Any raised decks and porches should have guardrails on the edges.  Have any leaves, snow, or ice cleared  regularly.  Use sand or salt on walking paths during winter.  Clean up any spills in your garage right away. This includes oil or grease spills. What can I do in the bathroom?  Use night lights.  Install grab bars by the toilet and in the tub and shower. Do not use towel bars as grab bars.  Use non-skid mats or decals in the tub or shower.  If you need to sit down in the shower, use a plastic, non-slip stool.  Keep the floor dry. Clean up any water that spills on the floor as soon as it happens.  Remove soap buildup in the tub or shower regularly.  Attach bath mats securely with double-sided non-slip rug tape.  Do not have throw rugs and other things on the floor that can make you trip. What can I do in the bedroom?  Use night lights.  Make sure that you have a light by your bed that is easy to reach.  Do not use any sheets or blankets that are too big for  your bed. They should not hang down onto the floor.  Have a firm chair that has side arms. You can use this for support while you get dressed.  Do not have throw rugs and other things on the floor that can make you trip. What can I do in the kitchen?  Clean up any spills right away.  Avoid walking on wet floors.  Keep items that you use a lot in easy-to-reach places.  If you need to reach something above you, use a strong step stool that has a grab bar.  Keep electrical cords out of the way.  Do not use floor polish or wax that makes floors slippery. If you must use wax, use non-skid floor wax.  Do not have throw rugs and other things on the floor that can make you trip. What can I do with my stairs?  Do not leave any items on the stairs.  Make sure that there are handrails on both sides of the stairs and use them. Fix handrails that are broken or loose. Make sure that handrails are as long as the stairways.  Check any carpeting to make sure that it is firmly attached to the stairs. Fix any carpet that is loose  or worn.  Avoid having throw rugs at the top or bottom of the stairs. If you do have throw rugs, attach them to the floor with carpet tape.  Make sure that you have a light switch at the top of the stairs and the bottom of the stairs. If you do not have them, ask someone to add them for you. What else can I do to help prevent falls?  Wear shoes that:  Do not have high heels.  Have rubber bottoms.  Are comfortable and fit you well.  Are closed at the toe. Do not wear sandals.  If you use a stepladder:  Make sure that it is fully opened. Do not climb a closed stepladder.  Make sure that both sides of the stepladder are locked into place.  Ask someone to hold it for you, if possible.  Clearly mark and make sure that you can see:  Any grab bars or handrails.  First and last steps.  Where the edge of each step is.  Use tools that help you move around (mobility aids) if they are needed. These include:  Canes.  Walkers.  Scooters.  Crutches.  Turn on the lights when you go into a dark area. Replace any light bulbs as soon as they burn out.  Set up your furniture so you have a clear path. Avoid moving your furniture around.  If any of your floors are uneven, fix them.  If there are any pets around you, be aware of where they are.  Review your medicines with your doctor. Some medicines can make you feel dizzy. This can increase your chance of falling. Ask your doctor what other things that you can do to help prevent falls. This information is not intended to replace advice given to you by your health care provider. Make sure you discuss any questions you have with your health care provider. Document Released: 02/28/2009 Document Revised: 10/10/2015 Document Reviewed: 06/08/2014 Elsevier Interactive Patient Education  2017 [employer].

## 2020-01-23 ENCOUNTER — Encounter: Payer: Self-pay | Admitting: Internal Medicine

## 2020-01-23 ENCOUNTER — Ambulatory Visit (INDEPENDENT_AMBULATORY_CARE_PROVIDER_SITE_OTHER): Payer: PPO | Admitting: Internal Medicine

## 2020-01-23 ENCOUNTER — Other Ambulatory Visit: Payer: Self-pay

## 2020-01-23 VITALS — BP 132/70 | HR 81 | Temp 98.6°F | Resp 14 | Ht 67.0 in | Wt 228.2 lb

## 2020-01-23 DIAGNOSIS — Z23 Encounter for immunization: Secondary | ICD-10-CM | POA: Diagnosis not present

## 2020-01-23 DIAGNOSIS — Z6833 Body mass index (BMI) 33.0-33.9, adult: Secondary | ICD-10-CM

## 2020-01-23 DIAGNOSIS — E6609 Other obesity due to excess calories: Secondary | ICD-10-CM

## 2020-01-23 DIAGNOSIS — Z0001 Encounter for general adult medical examination with abnormal findings: Secondary | ICD-10-CM | POA: Diagnosis not present

## 2020-01-23 DIAGNOSIS — E559 Vitamin D deficiency, unspecified: Secondary | ICD-10-CM

## 2020-01-23 DIAGNOSIS — D513 Other dietary vitamin B12 deficiency anemia: Secondary | ICD-10-CM

## 2020-01-23 DIAGNOSIS — Z0000 Encounter for general adult medical examination without abnormal findings: Secondary | ICD-10-CM | POA: Insufficient documentation

## 2020-01-23 DIAGNOSIS — E785 Hyperlipidemia, unspecified: Secondary | ICD-10-CM

## 2020-01-23 DIAGNOSIS — R0989 Other specified symptoms and signs involving the circulatory and respiratory systems: Secondary | ICD-10-CM | POA: Diagnosis not present

## 2020-01-23 DIAGNOSIS — R0982 Postnasal drip: Secondary | ICD-10-CM

## 2020-01-23 DIAGNOSIS — R198 Other specified symptoms and signs involving the digestive system and abdomen: Secondary | ICD-10-CM | POA: Insufficient documentation

## 2020-01-23 DIAGNOSIS — Z1231 Encounter for screening mammogram for malignant neoplasm of breast: Secondary | ICD-10-CM

## 2020-01-23 DIAGNOSIS — I10 Essential (primary) hypertension: Secondary | ICD-10-CM

## 2020-01-23 DIAGNOSIS — M85851 Other specified disorders of bone density and structure, right thigh: Secondary | ICD-10-CM | POA: Diagnosis not present

## 2020-01-23 DIAGNOSIS — R09A2 Foreign body sensation, throat: Secondary | ICD-10-CM

## 2020-01-23 HISTORY — DX: Other specified symptoms and signs involving the circulatory and respiratory systems: R09.89

## 2020-01-23 HISTORY — DX: Foreign body sensation, throat: R09.A2

## 2020-01-23 HISTORY — DX: Other specified symptoms and signs involving the digestive system and abdomen: R19.8

## 2020-01-23 MED ORDER — CYANOCOBALAMIN 1000 MCG/ML IJ SOLN: 1000.0000 ug | INTRAMUSCULAR | 3 refills | Status: DC

## 2020-01-23 NOTE — Patient Instructions (Signed)
Referral to ENT  DEXA and mammogram ordered   Health Maintenance After Age 68 After age 25, you are at a higher risk for certain long-term diseases and infections as well as injuries from falls. Falls are a major cause of broken bones and head injuries in people who are older than age 39. Getting regular preventive care can help to keep you healthy and well. Preventive care includes getting regular testing and making lifestyle changes as recommended by your health care provider. Talk with your health care provider about:  Which screenings and tests you should have. A screening is a test that checks for a disease when you have no symptoms.  A diet and exercise plan that is right for you. What should I know about screenings and tests to prevent falls? Screening and testing are the best ways to find a health problem early. Early diagnosis and treatment give you the best chance of managing medical conditions that are common after age 67. Certain conditions and lifestyle choices may make you more likely to have a fall. Your health care provider may recommend:  Regular vision checks. Poor vision and conditions such as cataracts can make you more likely to have a fall. If you wear glasses, make sure to get your prescription updated if your vision changes.  Medicine review. Work with your health care provider to regularly review all of the medicines you are taking, including over-the-counter medicines. Ask your health care provider about any side effects that may make you more likely to have a fall. Tell your health care provider if any medicines that you take make you feel dizzy or sleepy.  Osteoporosis screening. Osteoporosis is a condition that causes the bones to get weaker. This can make the bones weak and cause them to break more easily.  Blood pressure screening. Blood pressure changes and medicines to control blood pressure can make you feel dizzy.  Strength and balance checks. Your health care  provider may recommend certain tests to check your strength and balance while standing, walking, or changing positions.  Foot health exam. Foot pain and numbness, as well as not wearing proper footwear, can make you more likely to have a fall.  Depression screening. You may be more likely to have a fall if you have a fear of falling, feel emotionally low, or feel unable to do activities that you used to do.  Alcohol use screening. Using too much alcohol can affect your balance and may make you more likely to have a fall. What actions can I take to lower my risk of falls? General instructions  Talk with your health care provider about your risks for falling. Tell your health care provider if: ? You fall. Be sure to tell your health care provider about all falls, even ones that seem minor. ? You feel dizzy, sleepy, or off-balance.  Take over-the-counter and prescription medicines only as told by your health care provider. These include any supplements.  Eat a healthy diet and maintain a healthy weight. A healthy diet includes low-fat dairy products, low-fat (lean) meats, and fiber from whole grains, beans, and lots of fruits and vegetables. Home safety  Remove any tripping hazards, such as rugs, cords, and clutter.  Install safety equipment such as grab bars in bathrooms and safety rails on stairs.  Keep rooms and walkways well-lit. Activity   Follow a regular exercise program to stay fit. This will help you maintain your balance. Ask your health care provider what types of exercise are  appropriate for you.  If you need a cane or walker, use it as recommended by your health care provider.  Wear supportive shoes that have nonskid soles. Lifestyle  Do not drink alcohol if your health care provider tells you not to drink.  If you drink alcohol, limit how much you have: ? 0-1 drink a day for women. ? 0-2 drinks a day for men.  Be aware of how much alcohol is in your drink. In the  [place] one drink equals one typical bottle of beer (12 oz), one-half glass of wine (5 oz), or one shot of hard liquor (1 oz).  Do not use any products that contain nicotine or tobacco, such as cigarettes and e-cigarettes. If you need help quitting, ask your health care provider. Summary  Having a healthy lifestyle and getting preventive care can help to protect your health and wellness after age 83.  Screening and testing are the best way to find a health problem early and help you avoid having a fall. Early diagnosis and treatment give you the best chance for managing medical conditions that are more common for people who are older than age 48.  Falls are a major cause of broken bones and head injuries in people who are older than age 52. Take precautions to prevent a fall at home.  Work with your health care provider to learn what changes you can make to improve your health and wellness and to prevent falls. This information is not intended to replace advice given to you by your health care provider. Make sure you discuss any questions you have with your health care provider. Document Revised: 08/25/2018 Document Reviewed: 03/17/2017 Elsevier Patient Education  2020 [employer].

## 2020-01-23 NOTE — Assessment & Plan Note (Signed)
I have addressed  BMI and recommended a low glycemic index diet utilizing smaller more frequent meals to increase metabolism.  I have also recommended that patient start exercising with a goal of 30 minutes of aerobic exercise a minimum of 5 days per week. Screening for lipid disorders, thyroid and diabetes to be done today.   

## 2020-01-23 NOTE — Assessment & Plan Note (Signed)

## 2020-01-23 NOTE — Assessment & Plan Note (Addendum)
Well controlled on current regimen. Renal function stable, no changes today.  Lab Results  Component Value Date   CREATININE 0.92 01/23/2020   Lab Results  Component Value Date   NA 140 01/23/2020   K 3.9 01/23/2020   CL 102 01/23/2020   CO2 31 01/23/2020

## 2020-01-23 NOTE — Assessment & Plan Note (Signed)
DEXA NEEDED

## 2020-01-23 NOTE — Assessment & Plan Note (Signed)
Recommend using [employer]'s sinus rinse and avoiding benadryl as this may be contributing to the feelinf of tenacious mucus collecting at base of sinuses. ENT evaluation needed.

## 2020-01-23 NOTE — Assessment & Plan Note (Addendum)
10 yr risk is 16%  based on  2021 lipid profile. She  prefers to avoid statins but has a strong FH .  Statin therapy advised   Lab Results  Component Value Date   CHOL 209 (H) 01/23/2020   HDL 43.00 01/23/2020   LDLCALC 120 (H) 12/10/2017   LDLDIRECT 148.0 01/23/2020   TRIG 201.0 (H) 01/23/2020   CHOLHDL 5 01/23/2020

## 2020-01-23 NOTE — Progress Notes (Addendum)
Patient ID: Jo White, female    DOB: 1950-11-26  Age: 69 y.o. MRN: [id number]  The patient is here for annual  wellness examination and management of other chronic and acute problems.  This visit occurred during the SARS-CoV-2 public health emergency.  Safety protocols were in place, including screening questions prior to the visit, additional usage of staff PPE, and extensive cleaning of exam room while observing appropriate contact time as indicated for disinfecting solutions.    Patient has received both doses of the available COVID 19 vaccine without complications.  Patient continues to mask when outside of the home except when walking in yard or at safe distances from others .  Patient denies any change in mood or development of unhealthy behaviors resuting from the pandemic's restriction of activities and socialization.      The risk factors are reflected in the social history.  Health Maintenance  Topic Date Due  . COVID-19 Vaccine (1) Never done  . Flu Shot  08/15/2020*  . Mammogram  02/20/2020  . Pneumonia vaccines (2 of 2 - PPSV23) 01/22/2021  . Tetanus Vaccine  11/22/2021  . Colon Cancer Screening  09/19/2026  . DEXA scan (bone density measurement)  Completed  .  Hepatitis C: One time screening is recommended by Center for Disease Control  (CDC) for  adults born from [birth year] through 1964.   Completed  *Topic was postponed. The date shown is not the original due date.    The roster of all physicians providing medical care to patient - is listed in the Snapshot section of the chart.  Activities of daily living:  The patient is 100% independent in all ADLs: dressing, toileting, feeding as well as independent mobility  Home safety : The patient has smoke detectors in the home. They wear seatbelts.  There are no firearms at home. There is no violence in the home.   There is no risks for hepatitis, STDs or HIV. There is no   history of blood transfusion. They have no travel history  to infectious disease endemic areas of the world.  The patient has seen their dentist in the last six month. They have seen their eye doctor in the last year. They admit to slight hearing difficulty with regard to whispered voices and some television programs.  They have deferred audiologic testing in the last year.  They do not  have excessive sun exposure. Discussed the need for sun protection: hats, long sleeves and use of sunscreen if there is significant sun exposure.   Diet: the importance of a healthy diet is discussed. They do have a healthy diet.  The benefits of regular aerobic exercise were discussed. She walks 4 times per week ,  20 minutes.   Depression screen: there are no signs or vegative symptoms of depression- irritability, change in appetite, anhedonia, sadness/tearfullness.  Cognitive assessment: the patient manages all their financial and personal affairs and is actively engaged. They could relate day,date,year and events; recalled 2/3 objects at 3 minutes; performed clock-face test normally.  The following portions of the patient's history were reviewed and updated as appropriate: allergies, current medications, past family history, past medical history,  past surgical history, past social history  and problem list.  Visual acuity was not assessed per patient preference since she has regular follow up with her ophthalmologist. Hearing and body mass index were assessed and reviewed.   During the course of the visit the patient was educated and counseled about appropriate screening and preventive  services including : fall prevention , diabetes screening, nutrition counseling, colorectal cancer screening, and recommended immunizations.    CC: The primary encounter diagnosis was Encounter for preventive health examination. Diagnoses of Osteopenia of neck of right femur, Breast cancer screening by mammogram, Vitamin D deficiency, Hyperlipidemia LDL goal <100, Essential  hypertension, Other dietary vitamin B12 deficiency anemia, PND (post-nasal drip), Globus sensation, Class 1 obesity due to excess calories without serious comorbidity with body mass index (BMI) of 33.0 to 33.9 in adult, and Need for vaccination against Streptococcus pneumoniae using pneumococcal conjugate vaccine 13 were also pertinent to this visit.   STILL HAVING PND 5 days per week, feels like she is choking at night,  Clears throat a lot.  Feels like there is a glob at the back of her throat that makes her gag when she lies down most nights.  No allergy symptoms or congestion  Has tried benadryl and zyrtec  No pror allergy testing or ENT evaluation   History Jo White has a past medical history of Anemia, Basal cell carcinoma (12/19/2019), Blood in stool, Cancer ([place]), Complication of anesthesia, External hemorrhoids without mention of complication, Internal hemorrhoids with other complication, Otalgia, unspecified, Personal history of unspecified circulatory disease, PONV (postoperative nausea and vomiting), Squamous cell carcinoma of skin (12/06/2017), Squamous cell carcinoma of skin (12/06/2017), Swelling, mass, or lump in head and neck, and UTI (urinary tract infection) (07-2015).   She has a past surgical history that includes fallopian tube removal; Appendectomy; in vitro fertilization (1985); Breast biopsy; Hemorrhoid surgery (11/2006); Diagnostic laparoscopy; Colonoscopy; Transurethral resection of bladder tumor (N/A, 08/06/2015); and Cystoscopy.   Her family history includes Bradycardia in her mother; Breast cancer in her sister; Cancer in her sister; Colon polyps in her mother; Diabetes in her paternal grandmother, sister, and sister; Diverticulosis in her mother; Heart attack in her father; Heart disease in her mother; Heart disease (age of onset: 40) in her father; Heart failure in her mother; Hyperlipidemia in her sister; Hypertension in her brother and mother; Skin cancer in her sister.She  reports that she quit smoking about 48 years ago. Her smoking use included cigarettes. She has a 1.50 pack-year smoking history. She has never used smokeless tobacco. She reports current alcohol use. She reports that she does not use drugs.  Outpatient Medications Prior to Visit  Medication Sig Dispense Refill  . hydrochlorothiazide (HYDRODIURIL) 12.5 MG tablet TAKE ONE TABLET EVERY DAY 90 tablet 1  . cyanocobalamin (,VITAMIN B-12,) 1000 MCG/ML injection Inject 1 mL (1,000 mcg total) into the muscle once a week. For 3 weeks,  Then monthly 10 mL 3  . levofloxacin (LEVAQUIN) 500 MG tablet Take 500 mg by mouth daily. (Patient not taking: Reported on 01/23/2020)    . Vitamin D, Ergocalciferol, (DRISDOL) 1.25 MG (50000 UT) CAPS capsule TAKE 1 CAPSULE EVERY WEEK (Patient not taking: Reported on 01/23/2020) 12 capsule 0   Facility-Administered Medications Prior to Visit  Medication Dose Route Frequency Provider Last Rate Last Admin  . 0.9 %  sodium chloride infusion  500 mL Intravenous Continuous Milus Banister, MD        Review of Systems   Patient denies headache, fevers, malaise, unintentional weight loss, skin rash, eye pain, sinus congestion and sinus pain, sore throat, dysphagia,  hemoptysis , cough, dyspnea, wheezing, chest pain, palpitations, orthopnea, edema, abdominal pain, nausea, melena, diarrhea, constipation, flank pain, dysuria, hematuria, urinary  Frequency, nocturia, numbness, tingling, seizures,  Focal weakness, Loss of consciousness,  Tremor, insomnia, depression, anxiety, and suicidal  ideation.      Objective:  BP 132/70 (BP Location: Left Arm, Patient Position: Sitting, Cuff Size: Large)   Pulse 81   Temp 98.6 F (37 C) (Oral)   Resp 14   Ht 5\' 7"  (1.702 m)   Wt 228 lb 3.2 oz (103.5 kg)   SpO2 98%   BMI 35.74 kg/m   Physical Exam  General appearance: alert, cooperative and appears stated age Head: Normocephalic, without obvious abnormality, atraumatic Eyes:  conjunctivae/corneas clear. PERRL, EOM's intact. Fundi benign. Ears: normal TM's and external ear canals both ears Nose: Nares normal. Septum midline. Mucosa normal. No drainage or sinus tenderness. Throat: lips, mucosa, and tongue normal; teeth and gums normal Neck: no adenopathy, no carotid bruit, no JVD, supple, symmetrical, trachea midline and thyroid not enlarged, symmetric, no tenderness/mass/nodules Lungs: clear to auscultation bilaterally Breasts: normal appearance, no masses or tenderness Heart: regular rate and rhythm, S1, S2 normal, no murmur, click, rub or gallop Abdomen: soft, non-tender; bowel sounds normal; no masses,  no organomegaly Extremities: extremities normal, atraumatic, no cyanosis or edema Pulses: 2+ and symmetric Skin: Skin color, texture, turgor normal. No rashes or lesions Neurologic: Alert and oriented X 3, normal strength and tone. Normal symmetric reflexes. Normal coordination and gait.    Assessment & Plan:   Problem List Items Addressed This Visit      Unprioritized   Vitamin D deficiency    Recurrent. Repeating mega dose for 4 weeks       Relevant Orders   VITAMIN D 25 Hydroxy (Vit-D Deficiency, Fractures) (Completed)   PND (post-nasal drip)   Relevant Orders   Ambulatory referral to ENT   Osteopenia    DEXA NEEDED       Relevant Orders   DG Bone Density   Obesity    I have addressed  BMI and recommended a low glycemic index diet utilizing smaller more frequent meals to increase metabolism.  I have also recommended that patient start exercising with a goal of 30 minutes of aerobic exercise a minimum of 5 days per week. Screening for lipid disorders, thyroid and diabetes to be done today.        Hyperlipidemia LDL goal <100    10 yr risk is 16%  based on  2021 lipid profile. She  prefers to avoid statins but has a strong FH .  Statin therapy advised   Lab Results  Component Value Date   CHOL 209 (H) 01/23/2020   HDL 43.00 01/23/2020    LDLCALC 120 (H) 12/10/2017   LDLDIRECT 148.0 01/23/2020   TRIG 201.0 (H) 01/23/2020   CHOLHDL 5 01/23/2020        Relevant Orders   Lipid panel (Completed)   TSH (Completed)   Globus sensation    Recommend using [facility]'s sinus rinse and avoiding benadryl as this may be contributing to the feelinf of tenacious mucus collecting at base of sinuses. ENT evaluation needed.       Relevant Orders   Ambulatory referral to ENT   Essential hypertension    Well controlled on current regimen. Renal function stable, no changes today.  Lab Results  Component Value Date   CREATININE 0.92 01/23/2020   Lab Results  Component Value Date   NA 140 01/23/2020   K 3.9 01/23/2020   CL 102 01/23/2020   CO2 31 01/23/2020         Relevant Orders   Comprehensive metabolic panel (Completed)   Encounter for preventive health examination - Primary  age appropriate education and counseling updated, referrals for preventative services and immunizations addressed, dietary and smoking counseling addressed, most recent labs reviewed.  I have personally reviewed and have noted:  1) the patient's medical and social history 2) The pt's use of alcohol, tobacco, and illicit drugs 3) The patient's current medications and supplements 4) Functional ability including ADL's, fall risk, home safety risk, hearing and visual impairment 5) Diet and physical activities 6) Evidence for depression or mood disorder 7) The patient's height, weight, and BMI have been recorded in the chart  I have made referrals, and provided counseling and education based on review of the above      B12 deficiency anemia      She has missed several and current level is borderline low.  Repeat weekly x 4,  Then monthly       Relevant Medications   cyanocobalamin (,VITAMIN B-12,) 1000 MCG/ML injection   Other Relevant Orders   Vitamin B12 (Completed)    Other Visit Diagnoses    Breast cancer screening by mammogram        Relevant Orders   MM DIGITAL SCREENING BILATERAL   Need for vaccination against Streptococcus pneumoniae using pneumococcal conjugate vaccine 13       Relevant Orders   Pneumococcal conjugate vaccine 13-valent IM (Completed)      I have changed Teja W. Erxleben's Vitamin D (Ergocalciferol). I am also having her maintain her levofloxacin, hydrochlorothiazide, and cyanocobalamin. We will continue to administer sodium chloride.  Meds ordered this encounter  Medications  . cyanocobalamin (,VITAMIN B-12,) 1000 MCG/ML injection    Sig: Inject 1 mL (1,000 mcg total) into the muscle once a week. For 3 weeks,  Then monthly    Dispense:  10 mL    Refill:  3  . Vitamin D, Ergocalciferol, (DRISDOL) 1.25 MG (50000 UNIT) CAPS capsule    Sig: TAKE 1 CAPSULE EVERY WEEK    Dispense:  4 capsule    Refill:  0    Medications Discontinued During This Encounter  Medication Reason  . Vitamin D, Ergocalciferol, (DRISDOL) 1.25 MG (50000 UT) CAPS capsule Completed Course  . cyanocobalamin (,VITAMIN B-12,) 1000 MCG/ML injection Reorder    Follow-up: No follow-ups on file.   Crecencio Mc, MD

## 2020-01-23 NOTE — Addendum Note (Signed)
Addended by: Adair Laundry on: 01/23/2020 01:48 PM   Modules accepted: Orders

## 2020-01-24 LAB — LIPID PANEL
Cholesterol: 209 mg/dL — ABNORMAL HIGH (ref 0–200)
HDL: 43 mg/dL (ref >39.00)
NonHDL: 166.39
Total CHOL/HDL Ratio: 5
Triglycerides: 201 mg/dL — ABNORMAL HIGH (ref 0.0–149.0)
VLDL: 40.2 mg/dL — ABNORMAL HIGH (ref 0.0–40.0)

## 2020-01-24 LAB — TSH: TSH: 2.27 u[IU]/mL (ref 0.35–4.50)

## 2020-01-24 LAB — VITAMIN B12: Vitamin B-12: 301 pg/mL (ref 211–911)

## 2020-01-24 LAB — COMPREHENSIVE METABOLIC PANEL
ALT: 10 U/L (ref 0–35)
AST: 12 U/L (ref 0–37)
Albumin: 4.1 g/dL (ref 3.5–5.2)
Alkaline Phosphatase: 88 U/L (ref 39–117)
BUN: 14 mg/dL (ref 6–23)
CO2: 31 mEq/L (ref 19–32)
Calcium: 9 mg/dL (ref 8.4–10.5)
Chloride: 102 mEq/L (ref 96–112)
Creatinine, Ser: 0.92 mg/dL (ref 0.40–1.20)
GFR: 60.62 mL/min (ref >60.00)
Glucose, Bld: 89 mg/dL (ref 70–99)
Potassium: 3.9 mEq/L (ref 3.5–5.1)
Sodium: 140 mEq/L (ref 135–145)
Total Bilirubin: 0.5 mg/dL (ref 0.2–1.2)
Total Protein: 6.6 g/dL (ref 6.0–8.3)

## 2020-01-24 LAB — LDL CHOLESTEROL, DIRECT: Direct LDL: 148 mg/dL

## 2020-01-24 LAB — VITAMIN D 25 HYDROXY (VIT D DEFICIENCY, FRACTURES): VITD: 22.71 ng/mL — ABNORMAL LOW (ref 30.00–100.00)

## 2020-01-27 MED ORDER — VITAMIN D (ERGOCALCIFEROL) 1.25 MG (50000 UNIT) PO CAPS: ORAL_CAPSULE | ORAL | 0 refills | Status: DC

## 2020-01-27 NOTE — Assessment & Plan Note (Signed)
She has missed several and current level is borderline low.  Repeat weekly x 4,  Then monthly

## 2020-01-27 NOTE — Addendum Note (Signed)
Addended by: Crecencio Mc on: 01/27/2020 01:21 PM   Modules accepted: Orders

## 2020-01-27 NOTE — Assessment & Plan Note (Signed)
Recurrent. Repeating mega dose for 4 weeks

## 2020-02-01 DIAGNOSIS — K219 Gastro-esophageal reflux disease without esophagitis: Secondary | ICD-10-CM | POA: Diagnosis not present

## 2020-02-01 DIAGNOSIS — F458 Other somatoform disorders: Secondary | ICD-10-CM | POA: Diagnosis not present

## 2020-02-15 ENCOUNTER — Encounter: Payer: PPO | Admitting: Dermatology

## 2020-02-17 DIAGNOSIS — E785 Hyperlipidemia, unspecified: Secondary | ICD-10-CM

## 2020-02-21 DIAGNOSIS — Z1231 Encounter for screening mammogram for malignant neoplasm of breast: Secondary | ICD-10-CM | POA: Diagnosis not present

## 2020-02-21 LAB — HM MAMMOGRAPHY

## 2020-02-21 MED ORDER — ROSUVASTATIN CALCIUM 10 MG PO TABS: 10.0000 mg | ORAL_TABLET | Freq: Every day | ORAL | 3 refills | Status: DC

## 2020-02-21 NOTE — Assessment & Plan Note (Signed)
Starting rosuvastatin 10 mg daily

## 2020-04-18 ENCOUNTER — Other Ambulatory Visit: Payer: Self-pay

## 2020-04-18 ENCOUNTER — Other Ambulatory Visit (INDEPENDENT_AMBULATORY_CARE_PROVIDER_SITE_OTHER): Payer: PPO

## 2020-04-18 DIAGNOSIS — E785 Hyperlipidemia, unspecified: Secondary | ICD-10-CM | POA: Diagnosis not present

## 2020-04-18 LAB — COMPREHENSIVE METABOLIC PANEL
ALT: 11 U/L (ref 0–35)
AST: 14 U/L (ref 0–37)
Albumin: 4.1 g/dL (ref 3.5–5.2)
Alkaline Phosphatase: 74 U/L (ref 39–117)
BUN: 14 mg/dL (ref 6–23)
CO2: 32 mEq/L (ref 19–32)
Calcium: 8.7 mg/dL (ref 8.4–10.5)
Chloride: 103 mEq/L (ref 96–112)
Creatinine, Ser: 1.12 mg/dL (ref 0.40–1.20)
GFR: 50.46 mL/min — ABNORMAL LOW (ref >60.00)
Glucose, Bld: 90 mg/dL (ref 70–99)
Potassium: 3.5 mEq/L (ref 3.5–5.1)
Sodium: 143 mEq/L (ref 135–145)
Total Bilirubin: 0.5 mg/dL (ref 0.2–1.2)
Total Protein: 6.7 g/dL (ref 6.0–8.3)

## 2020-04-19 ENCOUNTER — Other Ambulatory Visit: Payer: Self-pay | Admitting: Internal Medicine

## 2020-04-22 ENCOUNTER — Other Ambulatory Visit: Payer: Self-pay | Admitting: Internal Medicine

## 2020-04-22 DIAGNOSIS — I10 Essential (primary) hypertension: Secondary | ICD-10-CM

## 2020-04-22 DIAGNOSIS — R944 Abnormal results of kidney function studies: Secondary | ICD-10-CM

## 2020-04-22 DIAGNOSIS — Z780 Asymptomatic menopausal state: Secondary | ICD-10-CM | POA: Diagnosis not present

## 2020-04-22 DIAGNOSIS — E785 Hyperlipidemia, unspecified: Secondary | ICD-10-CM

## 2020-04-22 DIAGNOSIS — R7301 Impaired fasting glucose: Secondary | ICD-10-CM

## 2020-04-22 DIAGNOSIS — M85851 Other specified disorders of bone density and structure, right thigh: Secondary | ICD-10-CM | POA: Diagnosis not present

## 2020-04-22 LAB — HM DEXA SCAN: HM Dexa Scan: NORMAL

## 2020-04-22 NOTE — Progress Notes (Signed)
Your repeat Liver enzymes are normal.  Continue ROSUVASTATIN for cholesterol management.   However,  there has been a slight decrease in GFR , which relates to kidney function , which may be due to the HCTZ you take for your blood pressure, which may be dehydrating you .  Make sure you are drinking at least 60 ounces of water daily,  and let's repeat it  in a month when you are well hydrated,  and if still slightly low we will discuss changing HCTZ to something else for management of hypertension .  Please schedule a fasting lab visit in about a e month and make sure you have had 16 ounces of water before the visit .  Regards,   Deborra Medina, MD      Regards,   Deborra Medina, MD

## 2020-04-24 ENCOUNTER — Telehealth: Payer: Self-pay | Admitting: Internal Medicine

## 2020-05-08 ENCOUNTER — Encounter: Payer: PPO | Admitting: Dermatology

## 2020-05-20 ENCOUNTER — Other Ambulatory Visit: Payer: Self-pay | Admitting: Internal Medicine

## 2020-06-13 ENCOUNTER — Other Ambulatory Visit (INDEPENDENT_AMBULATORY_CARE_PROVIDER_SITE_OTHER): Payer: PPO

## 2020-06-13 ENCOUNTER — Other Ambulatory Visit: Payer: Self-pay

## 2020-06-13 DIAGNOSIS — R944 Abnormal results of kidney function studies: Secondary | ICD-10-CM | POA: Diagnosis not present

## 2020-06-13 DIAGNOSIS — E785 Hyperlipidemia, unspecified: Secondary | ICD-10-CM | POA: Diagnosis not present

## 2020-06-13 DIAGNOSIS — R7301 Impaired fasting glucose: Secondary | ICD-10-CM

## 2020-06-13 DIAGNOSIS — Z20822 Contact with and (suspected) exposure to covid-19: Secondary | ICD-10-CM

## 2020-06-13 LAB — LIPID PANEL
Cholesterol: 141 mg/dL (ref 0–200)
HDL: 40.5 mg/dL (ref >39.00)
LDL Cholesterol: 69 mg/dL (ref 0–99)
NonHDL: 100.29
Total CHOL/HDL Ratio: 3
Triglycerides: 155 mg/dL — ABNORMAL HIGH (ref 0.0–149.0)
VLDL: 31 mg/dL (ref 0.0–40.0)

## 2020-06-13 LAB — BASIC METABOLIC PANEL
BUN: 14 mg/dL (ref 6–23)
CO2: 31 mEq/L (ref 19–32)
Calcium: 8.8 mg/dL (ref 8.4–10.5)
Chloride: 104 mEq/L (ref 96–112)
Creatinine, Ser: 1 mg/dL (ref 0.40–1.20)
GFR: 57.74 mL/min — ABNORMAL LOW (ref >60.00)
Glucose, Bld: 95 mg/dL (ref 70–99)
Potassium: 3.4 mEq/L — ABNORMAL LOW (ref 3.5–5.1)
Sodium: 142 mEq/L (ref 135–145)

## 2020-06-13 LAB — HEMOGLOBIN A1C: Hgb A1c MFr Bld: 5.4 % (ref 4.6–6.5)

## 2020-06-14 DIAGNOSIS — Z20822 Contact with and (suspected) exposure to covid-19: Secondary | ICD-10-CM | POA: Diagnosis not present

## 2020-06-14 NOTE — Addendum Note (Signed)
Addended by: Leeanne Rio on: 06/14/2020 09:55 AM   Modules accepted: Orders

## 2020-06-14 NOTE — Telephone Encounter (Signed)
COVID ANTIBODY TEST ORDERED PER PATIIENT REQUEST

## 2020-06-14 NOTE — Telephone Encounter (Signed)
Test added.   

## 2020-06-15 ENCOUNTER — Other Ambulatory Visit: Payer: Self-pay | Admitting: Internal Medicine

## 2020-06-15 DIAGNOSIS — I10 Essential (primary) hypertension: Secondary | ICD-10-CM

## 2020-06-15 DIAGNOSIS — E876 Hypokalemia: Secondary | ICD-10-CM

## 2020-06-15 LAB — SARS-COV-2 SEMI-QUANTITATIVE TOTAL ANTIBODY, SPIKE
SARS-CoV-2 Semi-Quant Total Ab: >2500 U/mL (ref <0.8)
SARS-CoV-2 Spike Ab Interp: POSITIVE

## 2020-06-15 MED ORDER — LOSARTAN POTASSIUM 50 MG PO TABS: 50.0000 mg | ORAL_TABLET | Freq: Every day | ORAL | 0 refills | Status: DC

## 2020-06-24 ENCOUNTER — Other Ambulatory Visit: Payer: Self-pay | Admitting: Dermatology

## 2020-06-24 ENCOUNTER — Ambulatory Visit: Payer: PPO | Admitting: Dermatology

## 2020-06-24 ENCOUNTER — Other Ambulatory Visit: Payer: Self-pay

## 2020-06-24 DIAGNOSIS — Z85828 Personal history of other malignant neoplasm of skin: Secondary | ICD-10-CM

## 2020-06-24 DIAGNOSIS — L308 Other specified dermatitis: Secondary | ICD-10-CM

## 2020-06-24 DIAGNOSIS — D229 Melanocytic nevi, unspecified: Secondary | ICD-10-CM

## 2020-06-24 DIAGNOSIS — L209 Atopic dermatitis, unspecified: Secondary | ICD-10-CM | POA: Diagnosis not present

## 2020-06-24 DIAGNOSIS — D485 Neoplasm of uncertain behavior of skin: Secondary | ICD-10-CM

## 2020-06-24 DIAGNOSIS — L821 Other seborrheic keratosis: Secondary | ICD-10-CM

## 2020-06-24 DIAGNOSIS — D1800 Hemangioma unspecified site: Secondary | ICD-10-CM | POA: Diagnosis not present

## 2020-06-24 DIAGNOSIS — L814 Other melanin hyperpigmentation: Secondary | ICD-10-CM | POA: Diagnosis not present

## 2020-06-24 DIAGNOSIS — Z1283 Encounter for screening for malignant neoplasm of skin: Secondary | ICD-10-CM | POA: Diagnosis not present

## 2020-06-24 DIAGNOSIS — L578 Other skin changes due to chronic exposure to nonionizing radiation: Secondary | ICD-10-CM | POA: Diagnosis not present

## 2020-06-24 DIAGNOSIS — L820 Inflamed seborrheic keratosis: Secondary | ICD-10-CM

## 2020-06-24 MED ORDER — MOMETASONE FUROATE 0.1 % EX CREA: 1.0000 "application " | TOPICAL_CREAM | Freq: Every day | CUTANEOUS | 0 refills | Status: DC

## 2020-06-24 NOTE — Progress Notes (Unsigned)
Follow-Up Visit   Subjective  Jo White is a 70 y.o. female who presents for the following: TBSE (Patient here for full body skin exam and skin cancer screening. Patient with hx of BCC and SCC. She has a spot at forehead that doesn't go away. No symptoms with it. ).  The following portions of the chart were reviewed this encounter and updated as appropriate:   Tobacco  Allergies  Meds  Problems  Med Hx  Surg Hx  Fam Hx     Review of Systems:  No other skin or systemic complaints except as noted in HPI or Assessment and Plan.  Objective  Well appearing patient in no apparent distress; mood and affect are within normal limits.  A full examination was performed including scalp, head, eyes, ears, nose, lips, neck, chest, axillae, abdomen, back, buttocks, bilateral upper extremities, bilateral lower extremities, hands, feet, fingers, toes, fingernails, and toenails. All findings within normal limits unless otherwise noted below.  Objective  Left side: 1.2cm irregular crusted papule   Objective  trunk x 16, forehead x 1 (17): Erythematous keratotic or waxy stuck-on papule or plaque.   Objective  Neck - Posterior: Pink scaly patch   Assessment & Plan  Neoplasm of uncertain behavior of skin Left side  Epidermal / dermal shaving  Lesion diameter (cm):  1.2 Informed consent: discussed and consent obtained   Timeout: patient name, date of birth, surgical site, and procedure verified   Procedure prep:  Patient was prepped and draped in usual sterile fashion Prep type:  Isopropyl alcohol Anesthesia: the lesion was anesthetized in a standard fashion   Anesthetic:  1% lidocaine w/ epinephrine 1-100,000 buffered w/ 8.4% NaHCO3 Instrument used: flexible razor blade   Hemostasis achieved with: pressure, aluminum chloride and electrodesiccation   Outcome: patient tolerated procedure well   Post-procedure details: sterile dressing applied and wound care instructions given    Dressing type: bandage and petrolatum    Specimen 1 - Surgical pathology Differential Diagnosis: ISK r/o Dysplasia  Check Margins: No 1.2cm irregular crusted papule   Inflamed seborrheic keratosis (17) trunk x 16, forehead x 1  Destruction of lesion - trunk x 16, forehead x 1 Complexity: simple   Destruction method: cryotherapy   Informed consent: discussed and consent obtained   Timeout:  patient name, date of birth, surgical site, and procedure verified Lesion destroyed using liquid nitrogen: Yes   Region frozen until ice ball extended beyond lesion: Yes   Outcome: patient tolerated procedure well with no complications   Post-procedure details: wound care instructions given    Eczema Neck - Posterior  Atopic dermatitis (eczema) is a chronic, relapsing, pruritic condition that can significantly affect quality of life. It is often associated with allergic rhinitis and/or asthma and can require treatment with topical medications, phototherapy, or in severe cases a biologic medication called Dupixent in older children and adults.   Start mometasone cream twice daily until clear #30 0RF Avoid applying to face, groin, and axilla. Use as directed. Risk of skin atrophy with long-term use reviewed.   Topical steroids (such as triamcinolone, fluocinolone, fluocinonide, mometasone, clobetasol, halobetasol, betamethasone, hydrocortisone) can cause thinning and lightening of the skin if they are used for too long in the same area. Your physician has selected the right strength medicine for your problem and area affected on the body. Please use your medication only as directed by your physician to prevent side effects.    Ordered Medications: mometasone (ELOCON) 0.1 % cream  Lentigines - Scattered tan macules - Discussed due to sun exposure - Benign, observe - Call for any changes  Seborrheic Keratoses - Stuck-on, waxy, tan-brown papules and plaques  - Discussed benign etiology and  prognosis. - Observe - Call for any changes  Melanocytic Nevi - Tan-brown and/or pink-flesh-colored symmetric macules and papules - Benign appearing on exam today - Observation - Call clinic for new or changing moles - Recommend daily use of broad spectrum spf 30+ sunscreen to sun-exposed areas.   Hemangiomas - Red papules - Discussed benign nature - Observe - Call for any changes  Actinic Damage - Chronic, secondary to cumulative UV/sun exposure - diffuse scaly erythematous macules with underlying dyspigmentation - Recommend daily broad spectrum sunscreen SPF 30+ to sun-exposed areas, reapply every 2 hours as needed.  - Call for new or changing lesions.  Skin cancer screening performed today.  History of Basal Cell Carcinoma of the Skin - No evidence of recurrence today - Recommend regular full body skin exams - Recommend daily broad spectrum sunscreen SPF 30+ to sun-exposed areas, reapply every 2 hours as needed.  - Call if any new or changing lesions are noted between office visits  History of Squamous Cell Carcinoma of the Skin - No evidence of recurrence today - No lymphadenopathy - Recommend regular full body skin exams - Recommend daily broad spectrum sunscreen SPF 30+ to sun-exposed areas, reapply every 2 hours as needed.  - Call if any new or changing lesions are noted between office visits  Return in about 1 year (around 06/24/2021) for TBSE.  Graciella Belton, RMA, am acting as scribe for Sarina Ser, MD . Documentation: I have reviewed the above documentation for accuracy and completeness, and I agree with the above.  Sarina Ser, MD

## 2020-06-24 NOTE — Patient Instructions (Addendum)
Melanoma ABCDEs  Melanoma is the most dangerous type of skin cancer, and is the leading cause of death from skin disease.  You are more likely to develop melanoma if you:  Have light-colored skin, light-colored eyes, or red or blond hair  Spend a lot of time in the sun  Tan regularly, either outdoors or in a tanning bed  Have had blistering sunburns, especially during childhood  Have a close family member who has had a melanoma  Have atypical moles or large birthmarks  Early detection of melanoma is key since treatment is typically straightforward and cure rates are extremely high if we catch it early.   The first sign of melanoma is often a change in a mole or a new dark spot.  The ABCDE system is a way of remembering the signs of melanoma.  A for asymmetry:  The two halves do not match. B for border:  The edges of the growth are irregular. C for color:  A mixture of colors are present instead of an even brown color. D for diameter:  Melanomas are usually (but not always) greater than 30mm - the size of a pencil eraser. E for evolution:  The spot keeps changing in size, shape, and color.  Please check your skin once per month between visits. You can use a small mirror in front and a large mirror behind you to keep an eye on the back side or your body.   If you see any new or changing lesions before your next follow-up, please call to schedule a visit.  Please continue daily skin protection including broad spectrum sunscreen SPF 30+ to sun-exposed areas, reapplying every 2 hours as needed when you're outdoors.    Cryotherapy Aftercare  . Wash gently with soap and water everyday.   Marland Kitchen Apply Vaseline and Band-Aid daily until healed.  Wound Care Instructions  1. Cleanse wound gently with soap and water once a day then pat dry with clean gauze. Apply a thing coat of Petrolatum (petroleum jelly, "Vaseline") over the wound (unless you have an allergy to this). We recommend that you  use a new, sterile tube of Vaseline. Do not pick or remove scabs. Do not remove the yellow or white "healing tissue" from the base of the wound.  2. Cover the wound with fresh, clean, nonstick gauze and secure with paper tape. You may use Band-Aids in place of gauze and tape if the would is small enough, but would recommend trimming much of the tape off as there is often too much. Sometimes Band-Aids can irritate the skin.  3. You should call the office for your biopsy report after 1 week if you have not already been contacted.  4. If you experience any problems, such as abnormal amounts of bleeding, swelling, significant bruising, significant pain, or evidence of infection, please call the office immediately.  5. FOR ADULT SURGERY PATIENTS: If you need something for pain relief you may take 1 extra strength Tylenol (acetaminophen) AND 2 Ibuprofen (200mg  each) together every 4 hours as needed for pain. (do not take these if you are allergic to them or if you have a reason you should not take them.) Typically, you may only need pain medication for 1 to 3 days.   Topical steroids (such as triamcinolone, fluocinolone, fluocinonide, mometasone, clobetasol, halobetasol, betamethasone, hydrocortisone) can cause thinning and lightening of the skin if they are used for too long in the same area. Your physician has selected the right strength medicine for  your problem and area affected on the body. Please use your medication only as directed by your physician to prevent side effects.

## 2020-06-25 ENCOUNTER — Other Ambulatory Visit (INDEPENDENT_AMBULATORY_CARE_PROVIDER_SITE_OTHER): Payer: PPO

## 2020-06-25 DIAGNOSIS — E876 Hypokalemia: Secondary | ICD-10-CM

## 2020-06-25 LAB — BASIC METABOLIC PANEL
BUN: 15 mg/dL (ref 6–23)
CO2: 31 mEq/L (ref 19–32)
Calcium: 9.1 mg/dL (ref 8.4–10.5)
Chloride: 104 mEq/L (ref 96–112)
Creatinine, Ser: 0.95 mg/dL (ref 0.40–1.20)
GFR: 61.4 mL/min (ref >60.00)
Glucose, Bld: 99 mg/dL (ref 70–99)
Potassium: 4.1 mEq/L (ref 3.5–5.1)
Sodium: 142 mEq/L (ref 135–145)

## 2020-06-27 ENCOUNTER — Telehealth: Payer: Self-pay

## 2020-06-27 ENCOUNTER — Encounter: Payer: Self-pay | Admitting: Dermatology

## 2020-06-27 NOTE — Telephone Encounter (Signed)
-----   Message from Ralene Bathe, MD sent at 06/26/2020 12:53 PM EST ----- Diagnosis Skin , left side SEBORRHEIC KERATOSIS, INFLAMED  Benign keratosis No further treatment needed

## 2020-06-27 NOTE — Telephone Encounter (Signed)
Advised patient of results/hd  

## 2020-09-11 DIAGNOSIS — Z8551 Personal history of malignant neoplasm of bladder: Secondary | ICD-10-CM | POA: Diagnosis not present

## 2020-09-11 DIAGNOSIS — N390 Urinary tract infection, site not specified: Secondary | ICD-10-CM | POA: Diagnosis not present

## 2020-09-12 ENCOUNTER — Other Ambulatory Visit: Payer: Self-pay

## 2020-09-12 MED ORDER — LOSARTAN POTASSIUM 50 MG PO TABS: 50.0000 mg | ORAL_TABLET | Freq: Every day | ORAL | 0 refills | Status: DC

## 2020-09-20 DIAGNOSIS — Z8551 Personal history of malignant neoplasm of bladder: Secondary | ICD-10-CM | POA: Diagnosis not present

## 2020-10-21 ENCOUNTER — Other Ambulatory Visit: Payer: Self-pay | Admitting: Internal Medicine

## 2020-11-11 DIAGNOSIS — M79604 Pain in right leg: Secondary | ICD-10-CM | POA: Diagnosis not present

## 2020-11-11 DIAGNOSIS — M1812 Unilateral primary osteoarthritis of first carpometacarpal joint, left hand: Secondary | ICD-10-CM | POA: Diagnosis not present

## 2020-12-14 DIAGNOSIS — Z20822 Contact with and (suspected) exposure to covid-19: Secondary | ICD-10-CM | POA: Diagnosis not present

## 2020-12-16 ENCOUNTER — Other Ambulatory Visit: Payer: Self-pay | Admitting: Internal Medicine

## 2020-12-24 ENCOUNTER — Ambulatory Visit (INDEPENDENT_AMBULATORY_CARE_PROVIDER_SITE_OTHER): Payer: PPO

## 2020-12-24 VITALS — Ht 67.0 in | Wt 228.0 lb

## 2020-12-24 DIAGNOSIS — Z0000 Encounter for general adult medical examination without abnormal findings: Secondary | ICD-10-CM

## 2020-12-24 DIAGNOSIS — Z1231 Encounter for screening mammogram for malignant neoplasm of breast: Secondary | ICD-10-CM | POA: Diagnosis not present

## 2020-12-24 NOTE — Patient Instructions (Signed)
  Jo White , Thank you for taking time to come for your Medicare Wellness Visit. I appreciate your ongoing commitment to your health goals. Please review the following plan we discussed and let me know if I can assist you in the future.   These are the goals we discussed:  Goals       Patient Stated     DIET - REDUCE PORTION SIZE (pt-stated)      I would like to lose a little weight      Follow up with Primary Care Provider (pt-stated)        This is a list of the screening recommended for you and due dates:  Health Maintenance  Topic Date Due   COVID-19 Vaccine (1) 01/09/2021*   Flu Shot  02/09/2021*   Zoster (Shingles) Vaccine (1 of 2) 03/26/2021*   Pneumonia vaccines (2 of 2 - PPSV23) 01/22/2021   Mammogram  02/20/2021   Tetanus Vaccine  11/22/2021   Colon Cancer Screening  09/19/2026   DEXA scan (bone density measurement)  Completed   Hepatitis C Screening: USPSTF Recommendation to screen - Ages 69-79 yo.  Completed   HPV Vaccine  Aged Out  *Topic was postponed. The date shown is not the original due date.

## 2020-12-24 NOTE — Progress Notes (Signed)
Subjective:   Jo White is a 70 y.o. female who presents for Medicare Annual (Subsequent) preventive examination.  Review of Systems    No ROS.  Medicare Wellness Virtual Visit.  Visual/audio telehealth visit, UTA vital signs.   See social history for additional risk factors.   Cardiac Risk Factors include: advanced age (>63mn, >>29women);hypertension     Objective:    Today's Vitals   12/24/20 0952  Weight: 228 lb (103.4 kg)  Height: '5\' 7"'$  (1.702 m)   Body mass index is 35.71 kg/m.  Advanced Directives 12/24/2020 12/22/2019 12/21/2018 04/20/2018 09/18/2016 09/04/2016 08/06/2015  Does Patient Have a Medical Advance Directive? Yes No No No No No No  [place] of [place] - - - - - -  Does patient want to make changes to medical advance directive? No - Patient declined - - - - - -  Copy of [place] Chart? No - copy requested - - - - - -  Would patient like information on creating a medical advance directive? - No - Patient declined Yes (MAU/Ambulatory/Procedural Areas - Information given) No - Patient declined - - No - patient declined information    Current Medications (verified) Outpatient Encounter Medications as of 12/24/2020  Medication Sig   cyanocobalamin (,VITAMIN B-12,) 1000 MCG/ML injection Inject 1 mL (1,000 mcg total) into the muscle once a week. For 3 weeks,  Then monthly   levofloxacin (LEVAQUIN) 500 MG tablet Take 500 mg by mouth daily. (Patient not taking: Reported on 01/23/2020)   losartan (COZAAR) 50 MG tablet TAKE 1 TABLET BY MOUTH AT BEDTIME   mometasone (ELOCON) 0.1 % cream Apply 1 application topically daily. Twice daily to affected areas for eczema. Avoid applying to face, groin, and axilla. Use as directed. Risk of skin atrophy with long-term use reviewed.   rosuvastatin (CRESTOR) 10 MG tablet TAKE 1 TABLET BY MOUTH DAILY   Vitamin D, Ergocalciferol, (DRISDOL) 1.25 MG (50000 UNIT) CAPS capsule TAKE  1 CAPSULE BY MOUTH EVERY WEEK.   Facility-Administered Encounter Medications as of 12/24/2020  Medication   0.9 %  sodium chloride infusion    Allergies (verified) Patient has no known allergies.   History: Past Medical History:  Diagnosis Date   Anemia    Basal cell carcinoma 12/19/2019   R ant lat thigh   Blood in stool    [place]    bladder   Complication of anesthesia    External hemorrhoids without mention of complication    Internal hemorrhoids with other complication    Otalgia, unspecified    Personal history of unspecified circulatory disease    PONV (postoperative nausea and vomiting)    Squamous cell carcinoma of skin 12/06/2017   L ant thigh    Squamous cell carcinoma of skin 12/06/2017   R mid lat pretibial   Swelling, mass, or lump in head and neck    UTI (urinary tract infection) 07-2015   Past Surgical History:  Procedure Laterality Date   APPENDECTOMY     BREAST BIOPSY     right   COLONOSCOPY     CYSTOSCOPY     DIAGNOSTIC LAPAROSCOPY     MULITPLE FOR IN VITRO   fallopian tube removal     r tube   HEMORRHOID SURGERY  11/2006   [facility] [facility] N/A 08/06/2015   Procedure: TRANSURETHRAL RESECTION OF BLADDER TUMOR (TURBT);  Surgeon:  Royston Cowper, MD;  Location: [facility] ORS;  Service: Urology;  Laterality: N/A;   Family History  Problem Relation Age of Onset   Heart disease Father 62       secodnary to Bright's disease   Heart attack Father    Hypertension Mother    Bradycardia Mother    Heart failure Mother    Colon polyps Mother    Diverticulosis Mother    Heart disease Mother    Diabetes Sister    Hyperlipidemia Sister    Cancer Sister        breast   Breast cancer Sister    Hypertension Brother    Diabetes Paternal Grandmother    Diabetes Sister    Skin cancer Sister    Colon cancer Neg Hx    Social History   Socioeconomic History   Marital status: Married    Spouse name:  Not on file   Number of children: 2   Years of education: Not on file   Highest education level: Not on file  Occupational History   Occupation: TECH    Employer: [employer]  Tobacco Use   Smoking status: Former    Packs/day: 1.00    Years: 1.50    Pack years: 1.50    Types: Cigarettes    Quit date: 01/11/1972    Years since quitting: 48.9   Smokeless tobacco: Never   Tobacco comments:    1 1/2 years as a teen  Media planner   Vaping Use: Never used  Substance and Sexual Activity   Alcohol use: Yes    Comment: rare alcohol intake   Drug use: No   Sexual activity: Not on file  Other Topics Concern   Not on file  Social History Narrative   No regular exercise, some walking   Diet- some fruit and veggies, avoid fast food   Social Determinants of Health   Financial Resource Strain: Low Risk    Difficulty of Paying Living Expenses: Not hard at all  Food Insecurity: No Food Insecurity   Worried About Charity fundraiser in the Last Year: Never true   Ran Out of Food in the Last Year: Never true  Transportation Needs: No Transportation Needs   Lack of Transportation (Medical): No   Lack of Transportation (Non-Medical): No  Physical Activity: Sufficiently Active   Days of Exercise per Week: 5 days   Minutes of Exercise per Session: 30 min  Stress: No Stress Concern Present   Feeling of Stress : Not at all  Social Connections: Unknown   Frequency of Communication with Friends and Family: Not on file   Frequency of Social Gatherings with Friends and Family: Not on file   Attends Religious Services: Not on Electrical engineer or Organizations: Not on file   Attends Archivist Meetings: Not on file   Marital Status: Married    Tobacco Counseling Counseling given: Not Answered Tobacco comments: 1 1/2 years as a teen   Clinical Intake:  Pre-visit preparation completed: Yes        Diabetes: No  How often do you need to have someone help  you when you read instructions, pamphlets, or other written materials from your doctor or pharmacy?: 1 - Never  Interpreter Needed?: No      Activities of Daily Living In your present state of health, do you have any difficulty performing the following activities: 12/24/2020  Hearing? N  Vision? N  Difficulty concentrating or making decisions? N  Walking or climbing stairs? N  Dressing or bathing? N  Doing errands, shopping? N  Preparing Food and eating ? N  Using the Toilet? N  In the past six months, have you accidently leaked urine? N  Do you have problems with loss of bowel control? N  Managing your Medications? N  Managing your Finances? N  Housekeeping or managing your Housekeeping? N  Some recent data might be hidden    Patient Care Team: Crecencio Mc, MD as PCP - General (Internal Medicine)  Indicate any recent Medical Services you may have received from other than [facility] providers in the past year (date may be approximate).     Assessment:   This is a routine wellness examination for Vickye.  I connected with Magally today by telephone and verified that I am speaking with the correct person using two identifiers. Location patient: home Location provider: work Persons participating in the virtual visit: patient, Marine scientist.    I discussed the limitations, risks, security and privacy concerns of performing an evaluation and management service by telephone and the availability of in person appointments. The patient expressed understanding and verbally consented to this telephonic visit.    Interactive audio and video telecommunications were attempted between this provider and patient, however failed, due to patient having technical difficulties OR patient did not have access to video capability.  We continued and completed visit with audio only.  Some vital signs may be absent or patient reported.   Hearing/Vision screen Hearing Screening - Comments:: Patient is able to  hear conversational tones without difficulty.  No issues reported.  Vision Screening - Comments:: Followed by [facility], Dr. Valma Cava  Wears corrective lenses    Dietary issues and exercise activities discussed: Current Exercise Habits: Home exercise routine, Intensity: Mild   Goals Addressed               This Visit's Progress     Patient Stated     DIET - REDUCE PORTION SIZE (pt-stated)        I would like to lose a little weight       Depression Screen PHQ 2/9 Scores 12/24/2020 12/22/2019 12/21/2018 12/07/2017 05/29/2016  PHQ - 2 Score 0 0 0 0 0  PHQ- 9 Score - - - 3 -    Fall Risk Fall Risk  12/24/2020 01/23/2020 12/22/2019 01/18/2019 12/21/2018  Falls in the past year? 0 0 0 0 0  Number falls in past yr: 0 - 0 - -  Follow up Falls evaluation completed Falls evaluation completed Falls evaluation completed Falls evaluation completed -    FALL RISK PREVENTION PERTAINING TO THE HOME:  Adequate lighting in your home to reduce risk of falls? Yes   ASSISTIVE DEVICES UTILIZED TO PREVENT FALLS:  Use of a cane, walker or w/c? No   TIMED UP AND GO:  Was the test performed? No .   Cognitive Function: Patient is alert and oriented x3.  Enjoys brain health activities MMSE/6CIT deferred. Normal by direct communication/observation. MMSE - Mini Mental State Exam 12/22/2019  Not completed: Unable to complete     6CIT Screen 12/21/2018  What Year? 0 points  What month? 0 points  What time? 0 points  Count back from 20 0 points  Months in reverse 0 points  Repeat phrase 0 points  Total Score 0    Immunizations Immunization History  Administered Date(s) Administered   Fluad Quad(high Dose 65+)  01/18/2019   Influenza Split 02/23/2013, 02/15/2014   Influenza Whole 01/17/2008   Influenza, High Dose Seasonal PF 03/15/2018, 04/08/2020   Influenza-Unspecified 02/15/2014, 03/21/2016, 02/22/2017   Pneumococcal Conjugate-13 01/23/2020   Td 07/02/2008   Tdap 11/23/2011     Health Maintenance There are no preventive care reminders to display for this patient. Health Maintenance  Topic Date Due   COVID-19 Vaccine (1) 01/09/2021 (Originally 08/02/1955)   INFLUENZA VACCINE  02/09/2021 (Originally 12/16/2020)   Zoster Vaccines- Shingrix (1 of 2) 03/26/2021 (Originally 08/03/1969)   PNA vac Low Risk Adult (2 of 2 - PPSV23) 01/22/2021   MAMMOGRAM  02/20/2021   TETANUS/TDAP  11/22/2021   COLONOSCOPY (Pts 45-67yr Insurance coverage will need to be confirmed)  09/19/2026   DEXA SCAN  Completed   Hepatitis C Screening  Completed   HPV VACCINES  Aged Out   Mammogram- ordered  Lung Cancer Screening: (Low Dose CT Chest recommended if Age 69-80years, 30 pack-year currently smoking OR have quit w/in 15years.) does not qualify.   Vision Screening: Recommended annual ophthalmology exams for early detection of glaucoma and other disorders of the eye.  Dental Screening: Recommended annual dental exams for proper oral hygiene  Community Resource Referral / Chronic Care Management: CRR required this visit?  No   CCM required this visit?  No      Plan:     I have personally reviewed and noted the following in the patient's chart:   Medical and social history Use of alcohol, tobacco or illicit drugs  Current medications and supplements including opioid prescriptions. Patient is not currently taking opioid.  Functional ability and status Nutritional status Physical activity Advanced directives List of other physicians Hospitalizations, surgeries, and ER visits in previous 12 months Vitals Screenings to include cognitive, depression, and falls Referrals and appointments  In addition, I have reviewed and discussed with patient certain preventive protocols, quality metrics, and best practice recommendations. A written personalized care plan for preventive services as well as general preventive health recommendations were provided to patient via mychart.      OVarney Biles LPN   [id number]

## 2021-01-27 ENCOUNTER — Ambulatory Visit
Admission: RE | Admit: 2021-01-27 | Discharge: 2021-01-27 | Disposition: A | Discharge status: Home / Self Care | Payer: PPO | Attending: Internal Medicine | Admitting: Internal Medicine

## 2021-01-27 ENCOUNTER — Ambulatory Visit
Admission: RE | Admit: 2021-01-27 | Discharge: 2021-01-27 | Disposition: A | Discharge status: Home / Self Care | Payer: PPO | Source: Ambulatory Visit | Attending: Internal Medicine | Admitting: Internal Medicine

## 2021-01-27 ENCOUNTER — Ambulatory Visit (INDEPENDENT_AMBULATORY_CARE_PROVIDER_SITE_OTHER): Payer: PPO | Admitting: Internal Medicine

## 2021-01-27 ENCOUNTER — Other Ambulatory Visit: Payer: Self-pay

## 2021-01-27 ENCOUNTER — Encounter: Payer: Self-pay | Admitting: Internal Medicine

## 2021-01-27 VITALS — BP 136/80 | HR 75 | Temp 95.7°F | Ht 67.0 in | Wt 214.0 lb

## 2021-01-27 DIAGNOSIS — D513 Other dietary vitamin B12 deficiency anemia: Secondary | ICD-10-CM

## 2021-01-27 DIAGNOSIS — R062 Wheezing: Secondary | ICD-10-CM

## 2021-01-27 DIAGNOSIS — E559 Vitamin D deficiency, unspecified: Secondary | ICD-10-CM | POA: Diagnosis not present

## 2021-01-27 DIAGNOSIS — Z1231 Encounter for screening mammogram for malignant neoplasm of breast: Secondary | ICD-10-CM

## 2021-01-27 DIAGNOSIS — R059 Cough, unspecified: Secondary | ICD-10-CM | POA: Diagnosis not present

## 2021-01-27 DIAGNOSIS — I10 Essential (primary) hypertension: Secondary | ICD-10-CM | POA: Diagnosis not present

## 2021-01-27 DIAGNOSIS — E785 Hyperlipidemia, unspecified: Secondary | ICD-10-CM | POA: Diagnosis not present

## 2021-01-27 DIAGNOSIS — Z0001 Encounter for general adult medical examination with abnormal findings: Secondary | ICD-10-CM

## 2021-01-27 DIAGNOSIS — J40 Bronchitis, not specified as acute or chronic: Secondary | ICD-10-CM

## 2021-01-27 MED ORDER — PREDNISONE 10 MG PO TABS: ORAL_TABLET | ORAL | 0 refills | Status: DC

## 2021-01-27 MED ORDER — ALBUTEROL SULFATE HFA 108 (90 BASE) MCG/ACT IN AERS: 2.0000 | INHALATION_SPRAY | Freq: Four times a day (QID) | RESPIRATORY_TRACT | 11 refills | Status: AC | PRN

## 2021-01-27 NOTE — Assessment & Plan Note (Signed)

## 2021-01-27 NOTE — Progress Notes (Signed)
Patient ID: Jo White, female    DOB: [birth year]  Age: 70 y.o. MRN: [id number]  The patient is here for annual preventive wellness examination and management of other chronic and acute problems.  She presented with URI symptoms and negative home covid test last week. Symptoms started 6  or more days ago   This visit occurred during the SARS-CoV-2 public health emergency.  Safety protocols were in place, including screening questions prior to the visit, additional usage of staff PPE, and extensive cleaning of exam room while observing appropriate contact time as indicated for disinfecting solutions.     The risk factors are reflected in the social history.  The roster of all physicians providing medical care to patient - is listed in the Snapshot section of the chart.  Activities of daily living:  The patient is 100% independent in all ADLs: dressing, toileting, feeding as well as independent mobility  Home safety : The patient has smoke detectors in the home. They wear seatbelts.  There are no firearms at home. There is no violence in the home.   There is no risks for hepatitis, STDs or HIV. There is no   history of blood transfusion. They have no travel history to infectious disease endemic areas of the world.  The patient has seen their dentist in the last six month. They have seen their eye doctor in the last year. They admit to slight hearing difficulty with regard to whispered voices and some television programs.  They have deferred audiologic testing in the last year.  They do not  have excessive sun exposure. Discussed the need for sun protection: hats, long sleeves and use of sunscreen if there is significant sun exposure.   Diet: the importance of a healthy diet is discussed. They do have a healthy diet.  The benefits of regular aerobic exercise were discussed. She walks 4 times per week ,  20 minutes.   Depression screen: there are no signs or vegative symptoms of depression-  irritability, change in appetite, anhedonia, sadness/tearfullness.  Cognitive assessment: the patient manages all their financial and personal affairs and is actively engaged. They could relate day,date,year and events; recalled 2/3 objects at 3 minutes; performed clock-face test normally.  The following portions of the patient's history were reviewed and updated as appropriate: allergies, current medications, past family history, past medical history,  past surgical history, past social history  and problem list.  Visual acuity was not assessed per patient preference since she has regular follow up with her ophthalmologist. Hearing and body mass index were assessed and reviewed.   During the course of the visit the patient was educated and counseled about appropriate screening and preventive services including : fall prevention , diabetes screening, nutrition counseling, colorectal cancer screening, and recommended immunizations.    CC: The primary encounter diagnosis was Wheezing. Diagnoses of Cough, Breast cancer screening by mammogram, Hyperlipidemia LDL goal <100, Other dietary vitamin B12 deficiency anemia, Vitamin D deficiency, Encounter for general adult medical examination with abnormal findings, Bronchitis with acute wheezing, and Essential hypertension were also pertinent to this visit.  HAD COVID INFECTION IN July.  MILD.  Less symptoms than currently having .  Current  SYMPTOMS started a week ago  with a cough and sinus congestion.  Afte r24 hours of symptoms , woke up up with a feeling that  "my throat/ airway closed up" for ten minutes, slept sitting up for the next 2 nights.  Currently endorsing sinus and chst congestion,  using mucinex DM. No body aches or fevers   Had a  one year period of right lateral LE pain from patella to mid calf. that started after a long day of festivities . The pain would last all day until bedtime.  Had been present for a year.  However it resolved prior to  initiation of NSAID therapy with  meloxicam prescribed by by Jo White for management of OA left thumb/CMC .    History Jo White has a past medical history of Anemia, Basal cell carcinoma (12/19/2019), Blood in stool, Cancer (Jo White), Chronic heel pain (99991111), Complication of anesthesia, External hemorrhoids without mention of complication, Globus sensation (01/23/2020), Internal hemorrhoids with other complication, Otalgia, unspecified, Pain in joint, shoulder region (08/19/2014), Personal history of unspecified circulatory disease, PONV (postoperative nausea and vomiting), Squamous cell carcinoma of skin (12/06/2017), Squamous cell carcinoma of skin (12/06/2017), Swelling, mass, or lump in head and neck, and UTI (urinary tract infection) (07-2015).   She has a past surgical history that includes fallopian tube removal; Appendectomy; in vitro fertilization (1985); Breast biopsy; Hemorrhoid surgery (11/2006); Diagnostic laparoscopy; Colonoscopy; Transurethral resection of bladder tumor (N/A, 08/06/2015); and Cystoscopy.   Her family history includes Bradycardia in her mother; Breast cancer in her sister; Cancer in her sister; Colon polyps in her mother; Diabetes in her paternal grandmother, sister, and sister; Diverticulosis in her mother; Heart attack in her father; Heart disease in her mother; Heart disease (age of onset: 54) in her father; Heart failure in her mother; Hyperlipidemia in her sister; Hypertension in her brother and mother; Skin cancer in her sister.She reports that she quit smoking about 49 years ago. Her smoking use included cigarettes. She has a 1.50 pack-year smoking history. She has never used smokeless tobacco. She reports current alcohol use. She reports that she does not use drugs.  Outpatient Medications Prior to Visit  Medication Sig Dispense Refill   cholecalciferol (VITAMIN D3) 25 MCG (1000 UNIT) tablet Take 1,000 Units by mouth daily.     cyanocobalamin (,VITAMIN B-12,) 1000  MCG/ML injection Inject 1 mL (1,000 mcg total) into the muscle once a week. For 3 weeks,  Then monthly 10 mL 3   losartan (COZAAR) 50 MG tablet TAKE 1 TABLET BY MOUTH AT BEDTIME 90 tablet 0   meloxicam (MOBIC) 15 MG tablet Take 15 mg by mouth daily.     rosuvastatin (CRESTOR) 10 MG tablet TAKE 1 TABLET BY MOUTH DAILY 90 tablet 3   levofloxacin (LEVAQUIN) 500 MG tablet Take 500 mg by mouth daily. (Patient not taking: No sig reported)     mometasone (ELOCON) 0.1 % cream Apply 1 application topically daily. Twice daily to affected areas for eczema. Avoid applying to face, groin, and axilla. Use as directed. Risk of skin atrophy with long-term use reviewed. 45 g 0   Vitamin D, Ergocalciferol, (DRISDOL) 1.25 MG (50000 UNIT) CAPS capsule TAKE 1 CAPSULE BY MOUTH EVERY WEEK. (Patient not taking: Reported on 01/27/2021) 4 capsule 0   Facility-Administered Medications Prior to Visit  Medication Dose Route Frequency Provider Last Rate Last Admin   0.9 %  sodium chloride infusion  500 mL Intravenous Continuous Milus Banister, MD        Review of Systems  Patient denies headache, fevers, malaise, unintentional weight loss, skin rash, eye pain, sinus pain, sore throat, dysphagia,  hemoptysis , c chest pain, palpitations, rthopnea, edema, abdominal pain, nausea, melena, diarrhea, constipation, flank pain, dysuria, hematuria, urinary  Frequency, nocturia, numbness, tingling, seizures,  Focal weakness,  Loss of consciousness,  Tremor, insomnia, depression, anxiety, and suicidal ideation.     Objective:  BP 136/80 (BP Location: Left Arm, Patient Position: Sitting, Cuff Size: Large)   Pulse 75   Temp (!) 95.7 F (35.4 C) (Temporal)   Ht '5\' 7"'$  (1.702 m)   Wt 214 lb (97.1 kg)   SpO2 97%   BMI 33.52 kg/m   Physical Exam   General appearance: alert, cooperative and appears stated age Head: Normocephalic, without obvious abnormality, atraumatic Eyes: conjunctivae/corneas clear. PERRL, EOM's intact. Fundi  benign. Ears: normal TM's and external ear canals both ears Nose: Nares normal. Septum midline. Mucosa normal. No drainage or sinus tenderness. Throat: lips, mucosa, and tongue normal; teeth and gums normal Neck: no adenopathy, no carotid bruit, no JVD, supple, symmetrical, trachea midline and thyroid not enlarged, symmetric, no tenderness/mass/nodules Lungs: coarse BS  bilaterally Breasts: normal appearance, no masses or tenderness Heart: regular rate and rhythm, S1, S2 normal, no murmur, click, rub or gallop Abdomen: soft, non-tender; bowel sounds normal; no masses,  no organomegaly Extremities: extremities normal, atraumatic, no cyanosis or edema Pulses: 2+ and symmetric Skin: Skin color, texture, turgor normal. No rashes or lesions Neurologic: Alert and oriented X 3, normal strength and tone. Normal symmetric reflexes. Normal coordination and gait.    Assessment & Plan:   Problem List Items Addressed This Visit       Unprioritized   Hyperlipidemia LDL goal <100    Tolerating rosuvastatin 10 mg daily  Since October for elevated 10 yr risk of CAD and strong FH       Relevant Orders   Lipid panel   Comprehensive metabolic panel   TSH   123456 deficiency anemia   Relevant Orders   CBC with Differential/Platelet   Vitamin B12   Vitamin D deficiency   Relevant Orders   VITAMIN D 25 Hydroxy (Vit-D Deficiency, Fractures)   Essential hypertension    Well controlled on current regimen of losartan . Renal function stable, no changes today.        Encounter for general adult medical examination with abnormal findings    age appropriate education and counseling updated, referrals for preventative services and immunizations addressed, dietary and smoking counseling addressed, most recent labs reviewed.  I have personally reviewed and have noted:   1) the patient's medical and social history 2) The pt's use of alcohol, tobacco, and illicit drugs 3) The patient's current medications  and supplements 4) Functional ability including ADL's, fall risk, home safety risk, hearing and visual impairment 5) Diet and physical activities 6) Evidence for depression or mood disorder 7) The patient's height, weight, and BMI have been recorded in the chart   I have made referrals, and provided counseling and education based on review of the above      Bronchitis with acute wheezing    covid 19, RSV and influenza ordered .  Plain chest fil,s , albuterol and prednisone taper ordered       Other Visit Diagnoses     Wheezing    -  Primary   Relevant Orders   COVID-19, Flu A+B and RSV   DG Chest 2 View (Completed)   Cough       Relevant Orders   COVID-19, Flu A+B and RSV   DG Chest 2 View (Completed)   Breast cancer screening by mammogram       Relevant Orders   MM DIGITAL SCREENING BILATERAL        Meds ordered this  encounter  Medications   predniSONE (DELTASONE) 10 MG tablet    Sig: 6 tablets on Day 1 , then reduce by 1 tablet daily until gone    Dispense:  21 tablet    Refill:  0   albuterol (VENTOLIN HFA) 108 (90 Base) MCG/ACT inhaler    Sig: Inhale 2 puffs into the lungs every 6 (six) hours as needed for wheezing or shortness of breath.    Dispense:  3.7 g    Refill:  11    Medications Discontinued During This Encounter  Medication Reason   mometasone (ELOCON) 0.1 % cream    levofloxacin (LEVAQUIN) 500 MG tablet    Vitamin D, Ergocalciferol, (DRISDOL) 1.25 MG (50000 UNIT) CAPS capsule     Follow-up: No follow-ups on file.   Crecencio Mc, MD

## 2021-01-27 NOTE — Assessment & Plan Note (Signed)
Well controlled on current regimen of losartan . Renal function stable, no changes today. 

## 2021-01-27 NOTE — Assessment & Plan Note (Signed)
Tolerating rosuvastatin 10 mg daily  Since October for elevated 10 yr risk of CAD and strong FH

## 2021-01-27 NOTE — Assessment & Plan Note (Signed)
covid 39, RSV and influenza ordered .  Plain chest fil,s , albuterol and prednisone taper ordered

## 2021-01-27 NOTE — Patient Instructions (Signed)
Please go get your chest  xray at [facility] on [address]   Albuterol inhaler for "prn use" and prednisone taper (start today) [facility]   Will update you on results of testing as they come in   [place] labs for 2 weeks

## 2021-01-28 LAB — COVID-19, FLU A+B AND RSV
Influenza A, NAA: NOT DETECTED
Influenza B, NAA: NOT DETECTED
RSV, NAA: NOT DETECTED
SARS-CoV-2, NAA: NOT DETECTED

## 2021-02-12 ENCOUNTER — Other Ambulatory Visit: Payer: Self-pay | Admitting: Internal Medicine

## 2021-02-13 ENCOUNTER — Other Ambulatory Visit (INDEPENDENT_AMBULATORY_CARE_PROVIDER_SITE_OTHER): Payer: PPO

## 2021-02-13 ENCOUNTER — Other Ambulatory Visit: Payer: Self-pay

## 2021-02-13 DIAGNOSIS — E785 Hyperlipidemia, unspecified: Secondary | ICD-10-CM

## 2021-02-13 DIAGNOSIS — E559 Vitamin D deficiency, unspecified: Secondary | ICD-10-CM

## 2021-02-13 DIAGNOSIS — D513 Other dietary vitamin B12 deficiency anemia: Secondary | ICD-10-CM | POA: Diagnosis not present

## 2021-02-13 LAB — CBC WITH DIFFERENTIAL/PLATELET
Basophils Absolute: 0 10*3/uL (ref 0.0–0.1)
Basophils Relative: 0.5 % (ref 0.0–3.0)
Eosinophils Absolute: 0.3 10*3/uL (ref 0.0–0.7)
Eosinophils Relative: 5.2 % — ABNORMAL HIGH (ref 0.0–5.0)
HCT: 37.4 % (ref 36.0–46.0)
Hemoglobin: 12.3 g/dL (ref 12.0–15.0)
Lymphocytes Relative: 28.1 % (ref 12.0–46.0)
Lymphs Abs: 1.4 10*3/uL (ref 0.7–4.0)
MCHC: 33 g/dL (ref 30.0–36.0)
MCV: 83.4 fl (ref 78.0–100.0)
Monocytes Absolute: 0.5 10*3/uL (ref 0.1–1.0)
Monocytes Relative: 9.6 % (ref 3.0–12.0)
Neutro Abs: 2.8 10*3/uL (ref 1.4–7.7)
Neutrophils Relative %: 56.6 % (ref 43.0–77.0)
Platelets: 190 10*3/uL (ref 150.0–400.0)
RBC: 4.48 Mil/uL (ref 3.87–5.11)
RDW: 15 % (ref 11.5–15.5)
WBC: 5 10*3/uL (ref 4.0–10.5)

## 2021-02-13 LAB — COMPREHENSIVE METABOLIC PANEL
ALT: 12 U/L (ref 0–35)
AST: 14 U/L (ref 0–37)
Albumin: 3.9 g/dL (ref 3.5–5.2)
Alkaline Phosphatase: 71 U/L (ref 39–117)
BUN: 14 mg/dL (ref 6–23)
CO2: 28 mEq/L (ref 19–32)
Calcium: 9 mg/dL (ref 8.4–10.5)
Chloride: 106 mEq/L (ref 96–112)
Creatinine, Ser: 1.04 mg/dL (ref 0.40–1.20)
GFR: 54.83 mL/min — ABNORMAL LOW (ref >60.00)
Glucose, Bld: 101 mg/dL — ABNORMAL HIGH (ref 70–99)
Potassium: 4.7 mEq/L (ref 3.5–5.1)
Sodium: 142 mEq/L (ref 135–145)
Total Bilirubin: 0.6 mg/dL (ref 0.2–1.2)
Total Protein: 6.4 g/dL (ref 6.0–8.3)

## 2021-02-13 LAB — VITAMIN B12: Vitamin B-12: 436 pg/mL (ref 211–911)

## 2021-02-13 LAB — LIPID PANEL
Cholesterol: 157 mg/dL (ref 0–200)
HDL: 44.8 mg/dL (ref >39.00)
LDL Cholesterol: 73 mg/dL (ref 0–99)
NonHDL: 112.14
Total CHOL/HDL Ratio: 4
Triglycerides: 194 mg/dL — ABNORMAL HIGH (ref 0.0–149.0)
VLDL: 38.8 mg/dL (ref 0.0–40.0)

## 2021-02-13 LAB — VITAMIN D 25 HYDROXY (VIT D DEFICIENCY, FRACTURES): VITD: 24.78 ng/mL — ABNORMAL LOW (ref 30.00–100.00)

## 2021-02-13 LAB — TSH: TSH: 2.88 u[IU]/mL (ref 0.35–5.50)

## 2021-02-24 DIAGNOSIS — Z1231 Encounter for screening mammogram for malignant neoplasm of breast: Secondary | ICD-10-CM | POA: Diagnosis not present

## 2021-02-24 LAB — HM MAMMOGRAPHY

## 2021-03-19 ENCOUNTER — Other Ambulatory Visit: Payer: Self-pay | Admitting: Internal Medicine

## 2021-04-17 ENCOUNTER — Encounter: Payer: Self-pay | Admitting: Internal Medicine

## 2021-06-17 ENCOUNTER — Other Ambulatory Visit: Payer: Self-pay | Admitting: Internal Medicine

## 2021-06-25 ENCOUNTER — Ambulatory Visit: Payer: PPO | Admitting: Dermatology

## 2021-06-25 ENCOUNTER — Other Ambulatory Visit: Payer: Self-pay

## 2021-06-25 DIAGNOSIS — D229 Melanocytic nevi, unspecified: Secondary | ICD-10-CM | POA: Diagnosis not present

## 2021-06-25 DIAGNOSIS — L821 Other seborrheic keratosis: Secondary | ICD-10-CM | POA: Diagnosis not present

## 2021-06-25 DIAGNOSIS — Z85828 Personal history of other malignant neoplasm of skin: Secondary | ICD-10-CM

## 2021-06-25 DIAGNOSIS — L853 Xerosis cutis: Secondary | ICD-10-CM

## 2021-06-25 DIAGNOSIS — D1800 Hemangioma unspecified site: Secondary | ICD-10-CM

## 2021-06-25 DIAGNOSIS — L820 Inflamed seborrheic keratosis: Secondary | ICD-10-CM

## 2021-06-25 DIAGNOSIS — L578 Other skin changes due to chronic exposure to nonionizing radiation: Secondary | ICD-10-CM | POA: Diagnosis not present

## 2021-06-25 DIAGNOSIS — L814 Other melanin hyperpigmentation: Secondary | ICD-10-CM

## 2021-06-25 DIAGNOSIS — Z1283 Encounter for screening for malignant neoplasm of skin: Secondary | ICD-10-CM

## 2021-06-25 NOTE — Progress Notes (Signed)
Follow-Up Visit   Subjective  Jo White is a 71 y.o. female who presents for the following: Annual Exam (Hx BCC, SCC - patient has noticed a lesion under her right breast, and L pretibial that she would like checked today. ). The patient presents for Total-Body Skin Exam (TBSE) for skin cancer screening and mole check.  The patient has spots, moles and lesions to be evaluated, some may be new or changing.  The following portions of the chart were reviewed this encounter and updated as appropriate:   Tobacco   Allergies   Meds   Problems   Med Hx   Surg Hx   Fam Hx      Review of Systems:  No other skin or systemic complaints except as noted in HPI or Assessment and Plan.  Objective  Well appearing patient in no apparent distress; mood and affect are within normal limits.  A full examination was performed including scalp, head, eyes, ears, nose, lips, neck, chest, axillae, abdomen, back, buttocks, bilateral upper extremities, bilateral lower extremities, hands, feet, fingers, toes, fingernails, and toenails. All findings within normal limits unless otherwise noted below.  Right Flank x 1, R inframammary x 1 (2) Erythematous stuck-on, waxy papule or plaque   Assessment & Plan  Inflamed seborrheic keratosis (2) Right Flank x 1, R inframammary x 1  Destruction of lesion - Right Flank x 1, R inframammary x 1 Complexity: simple   Destruction method: cryotherapy   Informed consent: discussed and consent obtained   Timeout:  patient name, date of birth, surgical site, and procedure verified Lesion destroyed using liquid nitrogen: Yes   Region frozen until ice ball extended beyond lesion: Yes   Outcome: patient tolerated procedure well with no complications   Post-procedure details: wound care instructions given    Skin cancer screening  Lentigines - Scattered tan macules - Due to sun exposure - Benign-appearing, observe - Recommend daily broad spectrum sunscreen SPF 30+ to  sun-exposed areas, reapply every 2 hours as needed. - Call for any changes  Seborrheic Keratoses - Stuck-on, waxy, tan-brown papules and/or plaques  - Benign-appearing - Discussed benign etiology and prognosis. - Observe - Call for any changes  Melanocytic Nevi - Tan-brown and/or pink-flesh-colored symmetric macules and papules - Benign appearing on exam today - Observation - Call clinic for new or changing moles - Recommend daily use of broad spectrum spf 30+ sunscreen to sun-exposed areas.   Hemangiomas - Red papules - Discussed benign nature - Observe - Call for any changes  Actinic Damage - Chronic condition, secondary to cumulative UV/sun exposure - diffuse scaly erythematous macules with underlying dyspigmentation - Recommend daily broad spectrum sunscreen SPF 30+ to sun-exposed areas, reapply every 2 hours as needed.  - Staying in the shade or wearing long sleeves, sun glasses (UVA+UVB protection) and wide brim hats (4-inch brim around the entire circumference of the hat) are also recommended for sun protection.  - Call for new or changing lesions.  History of Basal Cell Carcinoma of the Skin - No evidence of recurrence today - Recommend regular full body skin exams - Recommend daily broad spectrum sunscreen SPF 30+ to sun-exposed areas, reapply every 2 hours as needed.  - Call if any new or changing lesions are noted between office visits  History of Squamous Cell Carcinoma of the Skin - No evidence of recurrence today - No lymphadenopathy - Recommend regular full body skin exams - Recommend daily broad spectrum sunscreen SPF 30+ to sun-exposed  areas, reapply every 2 hours as needed.  - Call if any new or changing lesions are noted between office visits  Xerosis - diffuse xerotic patches - recommend gentle, hydrating skin care - gentle skin care handout given  Skin cancer screening performed today.  Return in about 1 year (around 06/25/2022) for TBSE.  Luther Redo, CMA, am acting as scribe for Sarina Ser, MD . Documentation: I have reviewed the above documentation for accuracy and completeness, and I agree with the above.  Sarina Ser, MD

## 2021-06-25 NOTE — Patient Instructions (Addendum)
Gentle Skin Care Guide  1. Bathe no more than once a day.  2. Avoid bathing in hot water  3. Use a mild soap like Dove, Vanicream, Cetaphil, CeraVe. Can use Lever 2000 or Cetaphil antibacterial soap  4. Use soap only where you need it. On most days, use it under your arms, between your legs, and on your feet. Let the water rinse other areas unless visibly dirty.  5. When you get out of the bath/shower, use a towel to gently blot your skin dry, don't rub it.  6. While your skin is still a little damp, apply a moisturizing cream such as Vanicream, CeraVe, Cetaphil, Eucerin, Sarna lotion or plain Vaseline Jelly. For hands apply Neutrogena [place] Hand Cream or Excipial Hand Cream.  7. Reapply moisturizer any time you start to itch or feel dry.  8. Sometimes using free and clear laundry detergents can be helpful. Fabric softener sheets should be avoided. Downy Free & Gentle liquid, or any liquid fabric softener that is free of dyes and perfumes, it acceptable to use  9. If your doctor has given you prescription creams you may apply moisturizers over them      If You Need Anything After Your Visit  If you have any questions or concerns for your doctor, please call our main line at [phone] and press option 4 to reach your doctor's medical assistant. If no one answers, please leave a voicemail as directed and we will return your call as soon as possible. Messages left after 4 pm will be answered the following business day.   You may also send [place] a message via [place]. We typically respond to MyChart messages within 1-2 business days.  For prescription refills, please ask your pharmacy to contact our office. Our fax number is [phone].  If you have an urgent issue when the clinic is closed that cannot wait until the next business day, you can page your doctor at the number below.    Please note that while we do our best to be available for urgent issues outside of office hours, we  are not available 24/7.   If you have an urgent issue and are unable to reach [place], you may choose to seek medical care at your doctor's office, retail clinic, urgent care center, or emergency room.  If you have a medical emergency, please immediately call 911 or go to the emergency department.  Pager Numbers  - Dr. Nehemiah Massed: [phone]  - Dr. Laurence Ferrari: [phone]  - Dr. Nicole Kindred: [phone]  In the event of inclement weather, please call our main line at [phone] for an update on the status of any delays or closures.  Dermatology Medication Tips: Please keep the boxes that topical medications come in in order to help keep track of the instructions about where and how to use these. Pharmacies typically print the medication instructions only on the boxes and not directly on the medication tubes.   If your medication is too expensive, please contact our office at [phone] option 4 or send [place] a message through [place].   We are unable to tell what your co-pay for medications will be in advance as this is different depending on your insurance coverage. However, we may be able to find a substitute medication at lower cost or fill out paperwork to get insurance to cover a needed medication.   If a prior authorization is required to get your medication covered by your insurance company, please allow [place] 1-2 business days to  complete this process.  Drug prices often vary depending on where the prescription is filled and some pharmacies may offer cheaper prices.  The website www.goodrx.com contains coupons for medications through different pharmacies. The prices here do not account for what the cost may be with help from insurance (it may be cheaper with your insurance), but the website can give you the price if you did not use any insurance.  - You can print the associated coupon and take it with your prescription to the pharmacy.  - You may also stop by our office during regular business  hours and pick up a GoodRx coupon card.  - If you need your prescription sent electronically to a different pharmacy, notify our office through [facility] or by phone at [phone] option 4.     Si Usted Necesita Algo Despus de Su Visita  Tambin puede enviarnos un mensaje a travs de Pharmacist, community. Por lo general respondemos a los mensajes de MyChart en el transcurso de 1 a 2 das hbiles.  Para renovar recetas, por favor pida a su farmacia que se ponga en contacto con nuestra oficina. Harland Dingwall de fax es [facility] [place] [phone].  Si tiene un asunto urgente cuando la clnica est cerrada y que no puede esperar hasta el siguiente da hbil, puede llamar/localizar a su doctor(a) al nmero que aparece a continuacin.   Por favor, tenga en cuenta que aunque hacemos todo lo posible para estar disponibles para asuntos urgentes fuera del horario de [place], no estamos disponibles las 24 horas del da, los 7 das de la [place].   Si tiene un problema urgente y no puede comunicarse con nosotros, puede optar por buscar atencin mdica  en el consultorio de su doctor(a), en una clnica privada, en un centro de atencin urgente o en una sala de emergencias.  Si tiene Engineering geologist, por favor llame inmediatamente al 911 o vaya a la sala de emergencias.  Nmeros de bper  - Dr. Nehemiah Massed: [phone]  - Dra. Moye: [phone]  - Dra. Nicole Kindred: [phone]  En caso de inclemencias del [place], por favor llame a Johnsie Kindred principal al [phone] para una actualizacin sobre el [place] de cualquier retraso o cierre.  Consejos para la medicacin en dermatologa: Por favor, guarde las cajas en las que vienen los medicamentos de uso tpico para ayudarle a seguir las instrucciones sobre dnde y cmo usarlos. Las farmacias generalmente imprimen las instrucciones del medicamento slo en las cajas y no directamente en los tubos del [place].   Si su medicamento es muy caro, por favor,  pngase en contacto con Zigmund Daniel llamando al [phone] y presione la opcin 4 o envenos un mensaje a travs de Pharmacist, community.   No podemos decirle cul ser su copago por los medicamentos por adelantado ya que esto es diferente dependiendo de la cobertura de su seguro. Sin embargo, es posible que podamos encontrar un medicamento sustituto a Electrical engineer un formulario para que el seguro cubra el medicamento que se considera necesario.   Si se requiere una autorizacin previa para que su compaa de seguros [place] su medicamento, por favor permtanos de 1 a 2 das hbiles para completar este proceso.  Los precios de los medicamentos varan con frecuencia dependiendo del Environmental consultant de dnde se surte la receta y alguna farmacias pueden ofrecer precios ms baratos.  El sitio web www.goodrx.com tiene cupones para medicamentos de Airline pilot. Los precios aqu no tienen en cuenta lo que podra costar con la ayuda del  seguro (puede ser ms barato con su seguro), pero el sitio web puede darle el precio si no Field seismologist.  - Puede imprimir el cupn correspondiente y llevarlo con su receta a la farmacia.  - Tambin puede pasar por nuestra oficina durante el horario de atencin regular y Charity fundraiser una tarjeta de cupones de GoodRx.  - Si necesita que su receta se enve electrnicamente a una farmacia diferente, informe a nuestra oficina a travs de MyChart de [place] o por telfono llamando al [phone] y presione la opcin 4.

## 2021-06-26 ENCOUNTER — Encounter: Payer: Self-pay | Admitting: Dermatology

## 2021-07-07 ENCOUNTER — Encounter: Payer: Self-pay | Admitting: Internal Medicine

## 2021-09-18 ENCOUNTER — Other Ambulatory Visit: Payer: Self-pay | Admitting: Internal Medicine

## 2021-10-03 DIAGNOSIS — Z8551 Personal history of malignant neoplasm of bladder: Secondary | ICD-10-CM | POA: Diagnosis not present

## 2021-10-03 DIAGNOSIS — R82998 Other abnormal findings in urine: Secondary | ICD-10-CM | POA: Diagnosis not present

## 2021-10-03 DIAGNOSIS — N390 Urinary tract infection, site not specified: Secondary | ICD-10-CM | POA: Diagnosis not present

## 2021-10-15 DIAGNOSIS — Z8551 Personal history of malignant neoplasm of bladder: Secondary | ICD-10-CM | POA: Diagnosis not present

## 2021-10-15 DIAGNOSIS — N3020 Other chronic cystitis without hematuria: Secondary | ICD-10-CM | POA: Diagnosis not present

## 2021-10-15 DIAGNOSIS — N390 Urinary tract infection, site not specified: Secondary | ICD-10-CM | POA: Diagnosis not present

## 2021-11-16 ENCOUNTER — Other Ambulatory Visit: Payer: Self-pay | Admitting: Internal Medicine

## 2021-12-12 IMAGING — CR DG CHEST 2V
1 series · 2 of 2 positions shown · non-contrast
Comparison: None.

CLINICAL DATA: 70-year-old female with 1 week of wheezing and cough

EXAM:
CHEST - 2 VIEW

[Series 1: dg chest 2 view · 0.14mm/px · 2 of 2 slices shown]
[im 1/2]
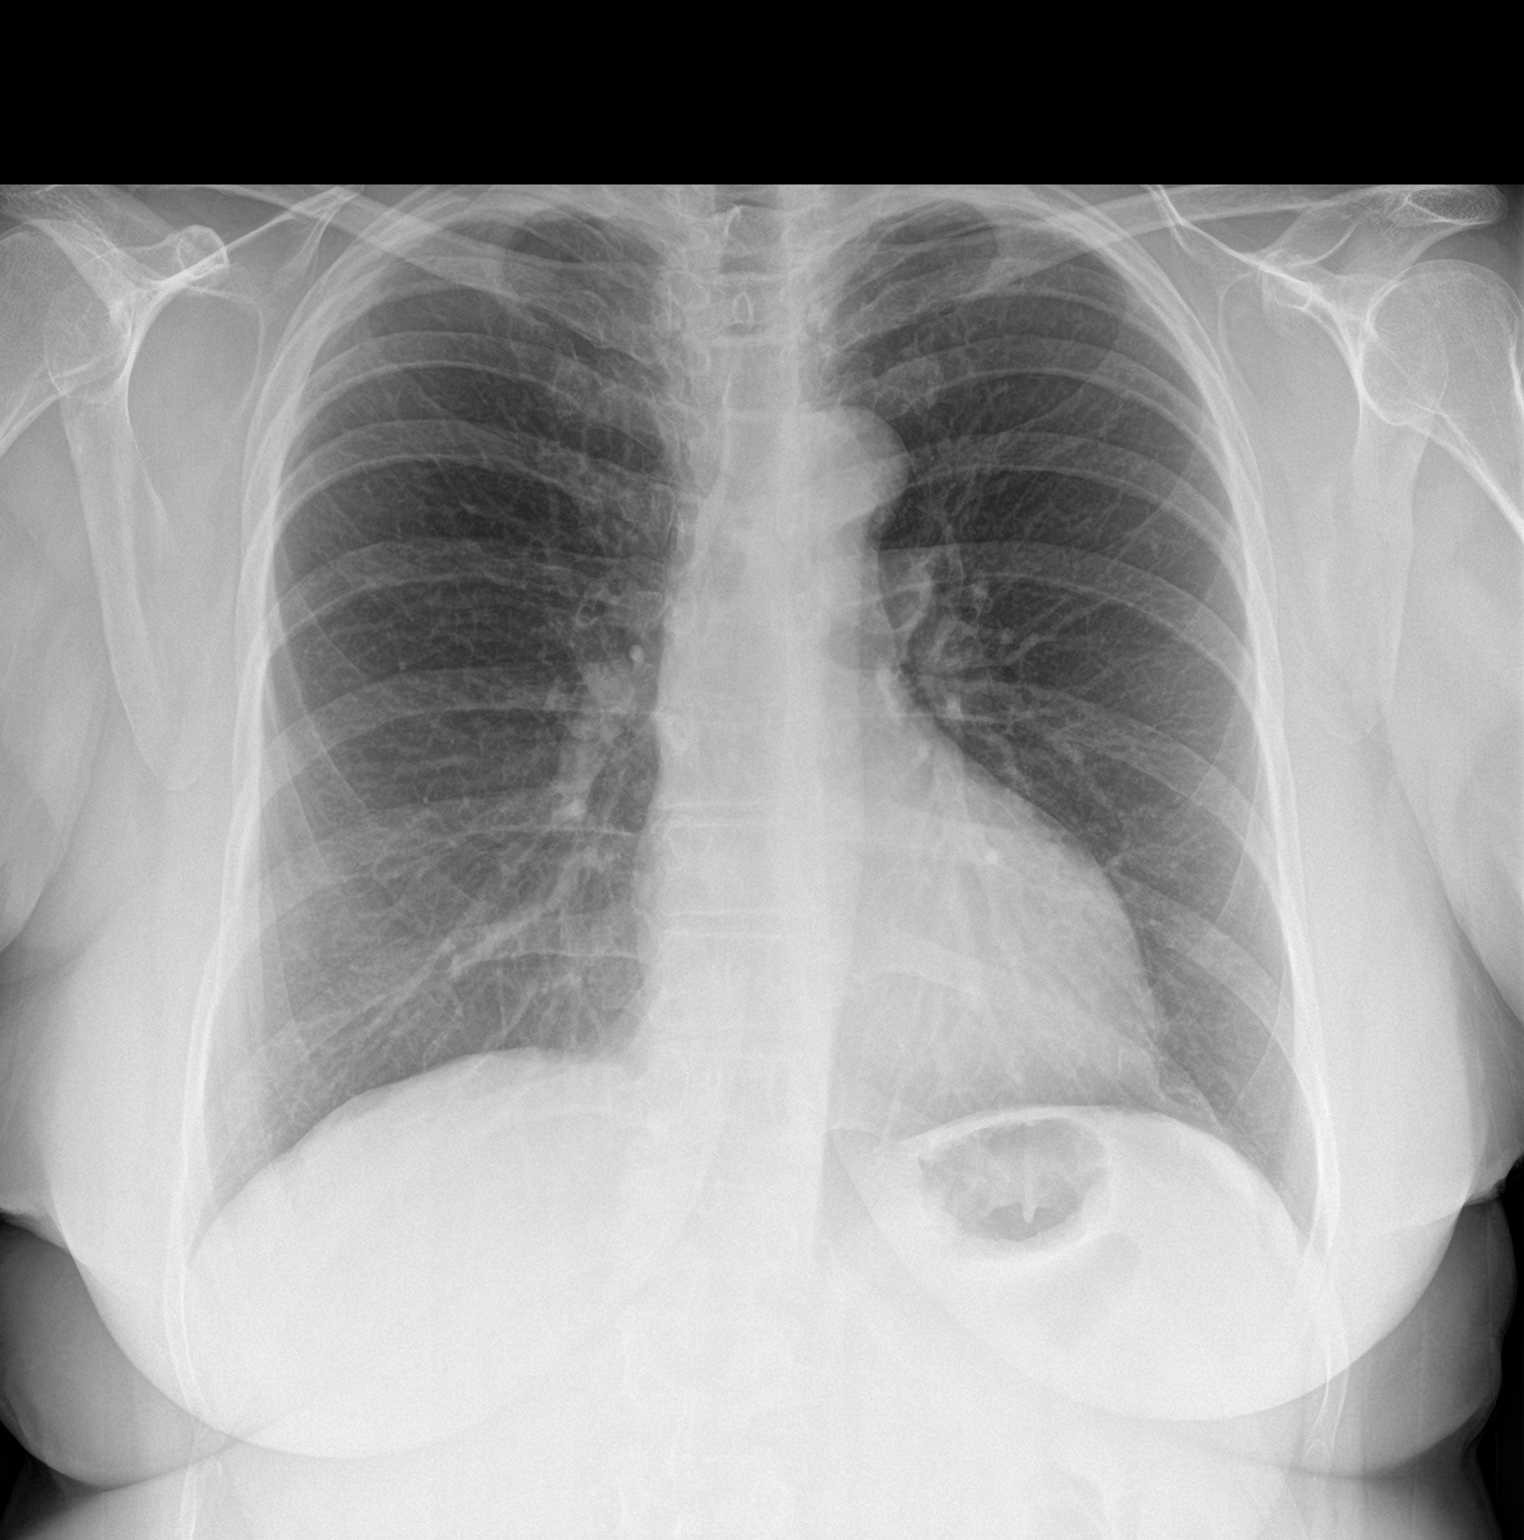
[im 2/2]
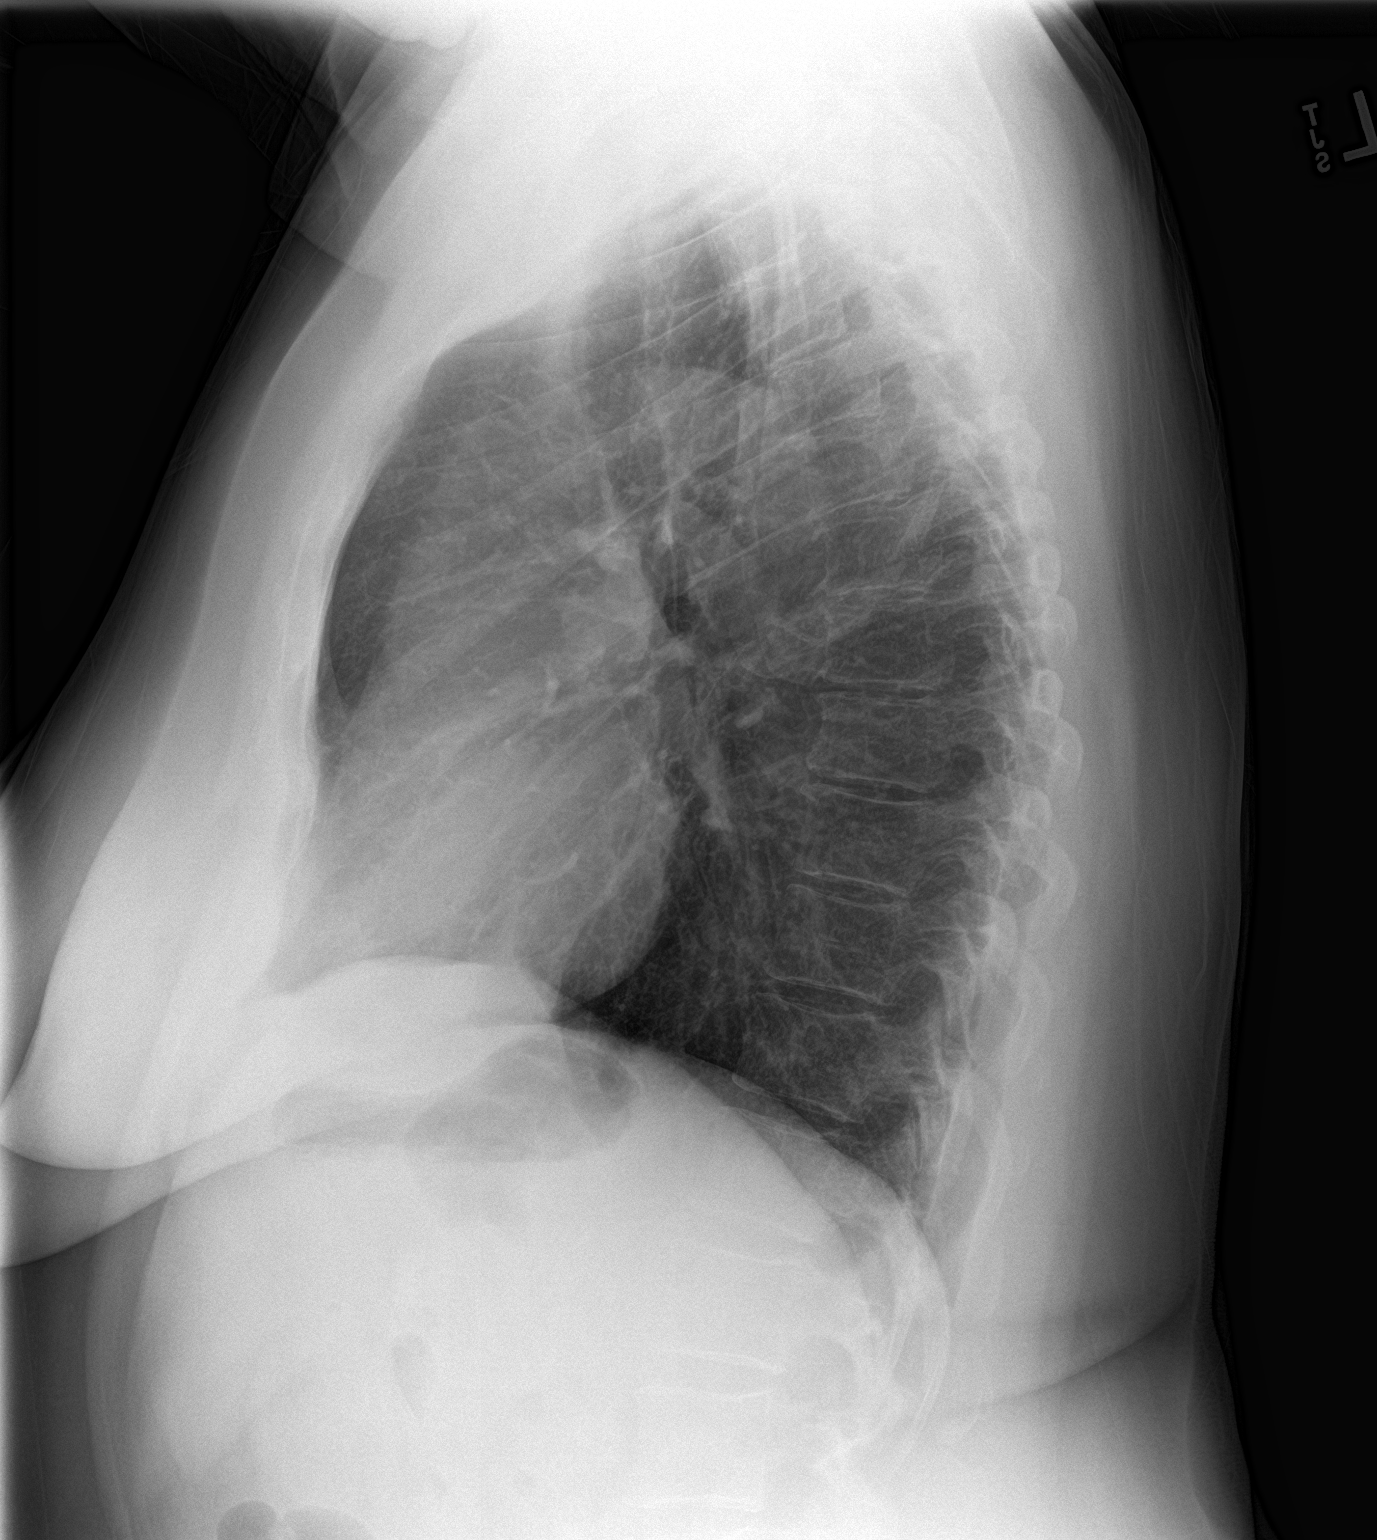

[2 of 2 positions shown; findings below may reference images not displayed]

FINDINGS: Cardiomediastinal silhouette within normal limits in size and
contour. No evidence of central vascular congestion. No interlobular
septal thickening.

No pneumothorax or pleural effusion. Coarsened interstitial
markings, with no confluent airspace disease.

No acute displaced fracture. Degenerative changes of the spine.
IMPRESSION: No active cardiopulmonary disease.

## 2021-12-25 ENCOUNTER — Telehealth: Payer: Self-pay | Admitting: Internal Medicine

## 2021-12-25 NOTE — Telephone Encounter (Signed)
Copied from [place] [phone]. Topic: Medicare AWV >> Dec 25, 2021 11:34 AM Devoria Glassing wrote: Reason for CRM: Called patient to schedule Annual Wellness Visit.  Please schedule with Nurse Health Advisor Denisa O'Brien-Blaney, LPN at [facility]. This appt can be telephone or office visit.  Please call [phone] ask for Juliann Pulse.

## 2022-01-26 ENCOUNTER — Ambulatory Visit (INDEPENDENT_AMBULATORY_CARE_PROVIDER_SITE_OTHER): Payer: PPO

## 2022-01-26 VITALS — Ht 67.0 in | Wt 214.0 lb

## 2022-01-26 DIAGNOSIS — Z0000 Encounter for general adult medical examination without abnormal findings: Secondary | ICD-10-CM

## 2022-01-26 NOTE — Progress Notes (Cosign Needed Addendum)
Subjective:   Jo White is a 71 y.o. female who presents for Medicare Annual (Subsequent) preventive examination.  Review of Systems    No ROS.  Medicare Wellness Virtual Visit.  Visual/audio telehealth visit, UTA vital signs.   See social history for additional risk factors.   Cardiac Risk Factors include: advanced age (>80mn, >>73women);hypertension     Objective:    Today's Vitals   01/26/22 1418  Weight: 214 lb (97.1 kg)  Height: '5\' 7"'$  (1.702 m)   Body mass index is 33.52 kg/m.     01/26/2022    2:31 PM 12/24/2020    9:57 AM 12/22/2019    9:52 AM 12/21/2018    9:43 AM 04/20/2018   12:42 PM 09/18/2016   10:04 AM 09/04/2016   11:18 AM  Advanced Directives  Does Patient Have a Medical Advance Directive? No Yes No No No No No  Type of ASocial research officer, governmentLiving will       Does patient want to make changes to medical advance directive?  No - Patient declined       [place] of [place]?  No - copy requested       Would patient like information on creating a medical advance directive? No - Patient declined  No - Patient declined Yes (MAU/Ambulatory/Procedural Areas - Information given) No - Patient declined      Current Medications (verified) Outpatient Encounter Medications as of 01/26/2022  Medication Sig   albuterol (VENTOLIN HFA) 108 (90 Base) MCG/ACT inhaler Inhale 2 puffs into the lungs every 6 (six) hours as needed for wheezing or shortness of breath.   cholecalciferol (VITAMIN D3) 25 MCG (1000 UNIT) tablet Take 1,000 Units by mouth daily.   cyanocobalamin (,VITAMIN B-12,) 1000 MCG/ML injection INJECT 1 ML INTRAMUSCULARLY ONCE A WEEK FOR 3 WEEKS  THEN MONTHLY.   losartan (COZAAR) 50 MG tablet TAKE 1 TABLET BY MOUTH AT BEDTIME   meloxicam (MOBIC) 15 MG tablet Take 15 mg by mouth daily. Take 1 tablet ('15mg'$ ) by mouth daily   rosuvastatin (CRESTOR) 10 MG tablet TAKE 1 TABLET BY MOUTH DAILY   [DISCONTINUED] meloxicam  (MOBIC) 15 MG tablet Take 15 mg by mouth daily.   [DISCONTINUED] predniSONE (DELTASONE) 10 MG tablet 6 tablets on Day 1 , then reduce by 1 tablet daily until gone   Facility-Administered Encounter Medications as of 01/26/2022  Medication   0.9 %  sodium chloride infusion    Allergies (verified) Patient has no known allergies.   History: Past Medical History:  Diagnosis Date   Anemia    Basal cell carcinoma 12/19/2019   R ant lat thigh   Blood in stool    [place]   Chronic heel pain 46/11/2092  Complication of anesthesia    External hemorrhoids without mention of complication    Globus sensation 01/23/2020   Internal hemorrhoids with other complication    Otalgia, unspecified    Pain in joint, shoulder region 08/19/2014   Personal history of unspecified circulatory disease    PONV (postoperative nausea and vomiting)    Squamous cell carcinoma of skin 12/06/2017   L ant thigh    Squamous cell carcinoma of skin 12/06/2017   R mid lat pretibial   Swelling, mass, or lump in head and neck    UTI (urinary tract infection) 07-2015   Past Surgical History:  Procedure Laterality Date   APPENDECTOMY  BREAST BIOPSY     right   COLONOSCOPY     CYSTOSCOPY     DIAGNOSTIC LAPAROSCOPY     MULITPLE FOR IN VITRO   fallopian tube removal     r tube   HEMORRHOID SURGERY  11/2006   in vitro fertilization  [place] TUMOR N/A 08/06/2015   Procedure: TRANSURETHRAL RESECTION OF BLADDER TUMOR (TURBT);  Surgeon: Royston Cowper, MD;  Location: [facility] ORS;  Service: Urology;  Laterality: N/A;   Family History  Problem Relation Age of Onset   Heart disease Father 84       secodnary to Bright's disease   Heart attack Father    Hypertension Mother    Bradycardia Mother    Heart failure Mother    Colon polyps Mother    Diverticulosis Mother    Heart disease Mother    Diabetes Sister    Hyperlipidemia Sister    Cancer Sister        breast    Breast cancer Sister    Hypertension Brother    Diabetes Paternal Grandmother    Diabetes Sister    Skin cancer Sister    Colon cancer Neg Hx    Social History   Socioeconomic History   Marital status: Married    Spouse name: Not on file   Number of children: 2   Years of education: Not on file   Highest education level: Not on file  Occupational History   Occupation: TECH    Employer: [employer]  Tobacco Use   Smoking status: Former    Packs/day: 1.00    Years: 1.50    Total pack years: 1.50    Types: Cigarettes    Quit date: 01/11/1972    Years since quitting: 50.0   Smokeless tobacco: Never   Tobacco comments:    1 1/2 years as a teen  Media planner   Vaping Use: Never used  Substance and Sexual Activity   Alcohol use: Yes    Comment: rare alcohol intake   Drug use: No   Sexual activity: Not on file  Other Topics Concern   Not on file  Social History Narrative   No regular exercise, some walking   Diet- some fruit and veggies, avoid fast food   Social Determinants of Health   Financial Resource Strain: Low Risk  (01/26/2022)   Overall Financial Resource Strain (CARDIA)    Difficulty of Paying Living Expenses: Not hard at all  Food Insecurity: No Food Insecurity (01/26/2022)   Hunger Vital Sign    Worried About Running Out of Food in the Last Year: Never true    Ran Out of Food in the Last Year: Never true  Transportation Needs: No Transportation Needs (01/26/2022)   PRAPARE - Hydrologist (Medical): No    Lack of Transportation (Non-Medical): No  Physical Activity: Sufficiently Active (12/24/2020)   Exercise Vital Sign    Days of Exercise per Week: 5 days    Minutes of Exercise per Session: 30 min  Stress: No Stress Concern Present (01/26/2022)   [place]    Feeling of Stress : Not at all  Social Connections: [place] (01/26/2022)   Social Connection  and Isolation Panel [NHANES]    Frequency of Communication with Friends and Family: More than three times a week    Frequency of Social Gatherings with Friends and Family: More  than three times a week    Attends Religious Services: More than 4 times per year    Active Member of Clubs or Organizations: Yes    Attends Archivist Meetings: Never    Marital Status: Married    Tobacco Counseling Counseling given: Not Answered Tobacco comments: 1 1/2 years as a teen   Clinical Intake:  Pre-visit preparation completed: Yes        Diabetes: No  How often do you need to have someone help you when you read instructions, pamphlets, or other written materials from your doctor or pharmacy?: 1 - Never   Interpreter Needed?: No      Activities of Daily Living    01/26/2022    2:20 PM  In your present state of health, do you have any difficulty performing the following activities:  Hearing? 0  Vision? 0  Difficulty concentrating or making decisions? 0  Walking or climbing stairs? 0  Dressing or bathing? 0  Doing errands, shopping? 0  Preparing Food and eating ? N  Using the Toilet? N  In the past six months, have you accidently leaked urine? N  Comment Followed by Urology. Managed with liner as needed  Do you have problems with loss of bowel control? N  Managing your Medications? N  Managing your Finances? N  Housekeeping or managing your Housekeeping? N    Patient Care Team: Crecencio Mc, MD as PCP - General (Internal Medicine)  Indicate any recent Medical Services you may have received from other than [facility] providers in the past year (date may be approximate).     Assessment:   This is a routine wellness examination for Jo White.  Virtual Visit via Telephone Note  I connected with  Jo White on 01/26/22 at  2:15 PM EDT by telephone and verified that I am speaking with the correct person using two identifiers.  Location: Patient: home Provider:  office Persons participating in the virtual visit: patient/Nurse Health Advisor   I discussed the limitations of performing an evaluation and management service by telehealth. We continued and completed visit with audio only. Some vital signs may be absent or patient reported.     Hearing/Vision screen Hearing Screening - Comments:: Patient is able to hear conversational tones without difficulty.  No issues reported. Vision Screening - Comments:: Followed by [facility], Dr. Valma Cava  Wears corrective lenses They have seen their ophthalmologist in the last 12 months.  Dietary issues and exercise activities discussed: Current Exercise Habits: Home exercise routine, Type of exercise: walking, Intensity: Mild Healthy diet Fair water intake   Goals Addressed               This Visit's Progress     Patient Stated     DIET - REDUCE PORTION SIZE (pt-stated)   On track     I would like to lose a little weight Increase physical activity, as tolerated Increase water intake       Depression Screen    01/26/2022    2:30 PM 01/27/2021   10:40 AM 12/24/2020    9:56 AM 12/22/2019    9:43 AM 12/21/2018    9:46 AM 12/07/2017    3:18 PM 05/29/2016    2:46 PM  PHQ 2/9 Scores  PHQ - 2 Score 0 0 0 0 0 0 0  PHQ- 9 Score      3     Fall Risk    01/26/2022    2:31 PM 01/27/2021  10:39 AM 12/24/2020   10:28 AM 01/23/2020    1:01 PM 12/22/2019    9:53 AM  [place] in the past year? 0 0 0 0 0  Number falls in past yr: 0  0  0  Injury with Fall? 0      Risk for fall due to : No Fall Risks      Follow up Falls evaluation completed Falls evaluation completed Falls evaluation completed Falls evaluation completed Falls evaluation completed    [place]: Home free of loose throw rugs in walkways, pet beds, electrical cords, etc? Yes  Adequate lighting in your home to reduce risk of falls? Yes   ASSISTIVE DEVICES UTILIZED TO PREVENT FALLS: Life alert? No   Use of a cane, walker or w/c? No  Grab bars in the bathroom? No  Shower chair or bench in shower? No  Comfort chair height toilet? Yes   TIMED UP AND GO: Was the test performed? No .   Cognitive Function:    12/22/2019    9:55 AM  MMSE - Mini Mental State Exam  Not completed: Unable to complete        01/26/2022    2:40 PM 12/21/2018    9:47 AM  6CIT Screen  What Year? 0 points 0 points  What month? 0 points 0 points  What time? 0 points 0 points  Count back from 20 0 points 0 points  Months in reverse 0 points 0 points  Repeat phrase 0 points 0 points  Total Score 0 points 0 points    Immunizations Immunization History  Administered Date(s) Administered   Fluad Quad(high Dose 65+) 01/18/2019   Influenza Split 02/23/2013, 02/15/2014   Influenza Whole 01/17/2008   Influenza, High Dose Seasonal PF 03/15/2018, 04/08/2020   Influenza-Unspecified 02/15/2014, 03/21/2016, 02/22/2017   PFIZER(Purple Top)SARS-COV-2 Vaccination 06/28/2019, 07/19/2019, 07/08/2020   Pneumococcal Conjugate-13 01/23/2020   Td 07/02/2008   Tdap 11/23/2011   TDAP status: Due, Education has been provided regarding the importance of this vaccine. Advised may receive this vaccine at local pharmacy or Health Dept. Aware to provide a copy of the vaccination record if obtained from local pharmacy or Health Dept. Verbalized acceptance and understanding.  Flu Vaccine status: Due, Education has been provided regarding the importance of this vaccine. Advised may receive this vaccine at local pharmacy or Health Dept. Aware to provide a copy of the vaccination record if obtained from local pharmacy or Health Dept. Verbalized acceptance and understanding.  Pneumococcal vaccine status: Due, Education has been provided regarding the importance of this vaccine. Advised may receive this vaccine at local pharmacy or Health Dept. Aware to provide a copy of the vaccination record if obtained from local pharmacy or Health  Dept. Verbalized acceptance and understanding.Deferred.   Covid-19 vaccine status: Completed vaccines x2.  Shingrix Completed?: No.    Education has been provided regarding the importance of this vaccine. Patient has been advised to call insurance company to determine out of pocket expense if they have not yet received this vaccine. Advised may also receive vaccine at local pharmacy or Health Dept. Verbalized acceptance and understanding.   Screening Tests Health Maintenance  Topic Date Due   COVID-19 Vaccine (4 - Pfizer risk series) 02/11/2022 (Originally 09/02/2020)   TETANUS/TDAP  02/15/2022 (Originally 11/22/2021)   Zoster Vaccines- Shingrix (1 of 2) 04/27/2022 (Originally 08/03/1969)   INFLUENZA VACCINE  08/16/2022 (Originally 12/16/2021)   Pneumonia Vaccine 21+ Years old (2 -  PPSV23 or PCV20) 01/27/2023 (Originally 01/22/2021)   MAMMOGRAM  02/24/2022   COLONOSCOPY (Pts 45-82yr Insurance coverage will need to be confirmed)  09/19/2026   DEXA SCAN  Completed   Hepatitis C Screening  Completed   HPV VACCINES  Aged Out   Health Maintenance There are no preventive care reminders to display for this patient.  Lung Cancer Screening: (Low Dose CT Chest recommended if Age 70-80years, 30 pack-year currently smoking OR have quit w/in 15years.) does not qualify.   Vision Screening: Recommended annual ophthalmology exams for early detection of glaucoma and other disorders of the eye.  Dental Screening: Recommended annual dental exams for proper oral hygiene  Community Resource Referral / Chronic Care Management: CRR required this visit?  No   CCM required this visit?  No      Plan:     I have personally reviewed and noted the following in the patient's chart:   Medical and social history Use of alcohol, tobacco or illicit drugs  Current medications and supplements including opioid prescriptions. Patient is not currently taking opioid prescriptions. Functional ability and  status Nutritional status Physical activity Advanced directives List of other physicians Hospitalizations, surgeries, and ER visits in previous 12 months Vitals Screenings to include cognitive, depression, and falls Referrals and appointments  In addition, I have reviewed and discussed with patient certain preventive protocols, quality metrics, and best practice recommendations. A written personalized care plan for preventive services as well as general preventive health recommendations were provided to patient.     OBrien-Blaney, Kammi Hechler L, LPN   [id number]    I have reviewed the above information and agree with above.   TDeborra Medina MD

## 2022-01-26 NOTE — Patient Instructions (Addendum)
Jo White , Thank you for taking time to come for your Medicare Wellness Visit. I appreciate your ongoing commitment to your health goals. Please review the following plan we discussed and let me know if I can assist you in the future.   These are the goals we discussed:  Goals       Patient Stated     DIET - REDUCE PORTION SIZE (pt-stated)      I would like to lose a little weight Increase physical activity, as tolerated Increase water intake      Follow up with Primary Care Provider (pt-stated)        This is a list of the screening recommended for you and due dates:  Health Maintenance  Topic Date Due   COVID-19 Vaccine (4 - Pfizer risk series) 02/11/2022*   Tetanus Vaccine  02/15/2022*   Zoster (Shingles) Vaccine (1 of 2) 04/27/2022*   Flu Shot  08/16/2022*   Pneumonia Vaccine (2 - PPSV23 or PCV20) 01/27/2023*   Mammogram  02/24/2022   Colon Cancer Screening  09/19/2026   DEXA scan (bone density measurement)  Completed   Hepatitis C Screening: USPSTF Recommendation to screen - Ages 54-79 yo.  Completed   HPV Vaccine  Aged Out  *Topic was postponed. The date shown is not the original due date.    Complete Shingles and Tdap Vaccine at local pharmacy  Next appointment: Follow up in one year for your annual wellness visit    Preventive Care 65 Years and Older, Female Preventive care refers to lifestyle choices and visits with your health care provider that can promote health and wellness. What does preventive care include? A yearly physical exam. This is also called an annual well check. Dental exams once or twice a year. Routine eye exams. Ask your health care provider how often you should have your eyes checked. Personal lifestyle choices, including: Daily care of your teeth and gums. Regular physical activity. Eating a healthy diet. Avoiding tobacco and drug use. Limiting alcohol use. Practicing safe sex. Taking low-dose aspirin every day. Taking vitamin and  mineral supplements as recommended by your health care provider. What happens during an annual well check? The services and screenings done by your health care provider during your annual well check will depend on your age, overall health, lifestyle risk factors, and family history of disease. Counseling  Your health care provider may ask you questions about your: Alcohol use. Tobacco use. Drug use. Emotional well-being. Home and relationship well-being. Sexual activity. Eating habits. History of falls. Memory and ability to understand (cognition). Work and work Statistician. Reproductive health. Screening  You may have the following tests or measurements: Height, weight, and BMI. Blood pressure. Lipid and cholesterol levels. These may be checked every 5 years, or more frequently if you are over 78 years old. Skin check. Lung cancer screening. You may have this screening every year starting at age 82 if you have a 30-pack-year history of smoking and currently smoke or have quit within the past 15 years. Fecal occult blood test (FOBT) of the stool. You may have this test every year starting at age 47. Flexible sigmoidoscopy or colonoscopy. You may have a sigmoidoscopy every 5 years or a colonoscopy every 10 years starting at age 91. Hepatitis C blood test. Hepatitis B blood test. Sexually transmitted disease (STD) testing. Diabetes screening. This is done by checking your blood sugar (glucose) after you have not eaten for a while (fasting). You may have this done every  1-3 years. Bone density scan. This is done to screen for osteoporosis. You may have this done starting at age 8. Mammogram. This may be done every 1-2 years. Talk to your health care provider about how often you should have regular mammograms. Talk with your health care provider about your test results, treatment options, and if necessary, the need for more tests. Vaccines  Your health care provider may recommend  certain vaccines, such as: Influenza vaccine. This is recommended every year. Tetanus, diphtheria, and acellular pertussis (Tdap, Td) vaccine. You may need a Td booster every 10 years. Zoster vaccine. You may need this after age 58. Pneumococcal 13-valent conjugate (PCV13) vaccine. One dose is recommended after age 65. Pneumococcal polysaccharide (PPSV23) vaccine. One dose is recommended after age 79. Talk to your health care provider about which screenings and vaccines you need and how often you need them. This information is not intended to replace advice given to you by your health care provider. Make sure you discuss any questions you have with your health care provider. Document Released: 05/31/2015 Document Revised: 01/22/2016 Document Reviewed: 03/05/2015 Elsevier Interactive Patient Education  2017 [employer] Prevention in the Home Falls can cause injuries. They can happen to people of all ages. There are many things you can do to make your home safe and to help prevent falls. What can I do on the outside of my home? Regularly fix the edges of walkways and driveways and fix any cracks. Remove anything that might make you trip as you walk through a door, such as a raised step or threshold. Trim any bushes or trees on the path to your home. Use bright outdoor lighting. Clear any walking paths of anything that might make someone trip, such as rocks or tools. Regularly check to see if handrails are loose or broken. Make sure that both sides of any steps have handrails. Any raised decks and porches should have guardrails on the edges. Have any leaves, snow, or ice cleared regularly. Use sand or salt on walking paths during winter. Clean up any spills in your garage right away. This includes oil or grease spills. What can I do in the bathroom? Use night lights. Install grab bars by the toilet and in the tub and shower. Do not use towel bars as grab bars. Use non-skid mats or  decals in the tub or shower. If you need to sit down in the shower, use a plastic, non-slip stool. Keep the floor dry. Clean up any water that spills on the floor as soon as it happens. Remove soap buildup in the tub or shower regularly. Attach bath mats securely with double-sided non-slip rug tape. Do not have throw rugs and other things on the floor that can make you trip. What can I do in the bedroom? Use night lights. Make sure that you have a light by your bed that is easy to reach. Do not use any sheets or blankets that are too big for your bed. They should not hang down onto the floor. Have a firm chair that has side arms. You can use this for support while you get dressed. Do not have throw rugs and other things on the floor that can make you trip. What can I do in the kitchen? Clean up any spills right away. Avoid walking on wet floors. Keep items that you use a lot in easy-to-reach places. If you need to reach something above you, use a strong step stool that has a  grab bar. Keep electrical cords out of the way. Do not use floor polish or wax that makes floors slippery. If you must use wax, use non-skid floor wax. Do not have throw rugs and other things on the floor that can make you trip. What can I do with my stairs? Do not leave any items on the stairs. Make sure that there are handrails on both sides of the stairs and use them. Fix handrails that are broken or loose. Make sure that handrails are as long as the stairways. Check any carpeting to make sure that it is firmly attached to the stairs. Fix any carpet that is loose or worn. Avoid having throw rugs at the top or bottom of the stairs. If you do have throw rugs, attach them to the floor with carpet tape. Make sure that you have a light switch at the top of the stairs and the bottom of the stairs. If you do not have them, ask someone to add them for you. What else can I do to help prevent falls? Wear shoes that: Do not  have high heels. Have rubber bottoms. Are comfortable and fit you well. Are closed at the toe. Do not wear sandals. If you use a stepladder: Make sure that it is fully opened. Do not climb a closed stepladder. Make sure that both sides of the stepladder are locked into place. Ask someone to hold it for you, if possible. Clearly mark and make sure that you can see: Any grab bars or handrails. First and last steps. Where the edge of each step is. Use tools that help you move around (mobility aids) if they are needed. These include: Canes. Walkers. Scooters. Crutches. Turn on the lights when you go into a dark area. Replace any light bulbs as soon as they burn out. Set up your furniture so you have a clear path. Avoid moving your furniture around. If any of your floors are uneven, fix them. If there are any pets around you, be aware of where they are. Review your medicines with your doctor. Some medicines can make you feel dizzy. This can increase your chance of falling. Ask your doctor what other things that you can do to help prevent falls. This information is not intended to replace advice given to you by your health care provider. Make sure you discuss any questions you have with your health care provider. Document Released: 02/28/2009 Document Revised: 10/10/2015 Document Reviewed: 06/08/2014 Elsevier Interactive Patient Education  2017 [employer].

## 2022-02-09 DIAGNOSIS — M222X1 Patellofemoral disorders, right knee: Secondary | ICD-10-CM | POA: Diagnosis not present

## 2022-02-10 ENCOUNTER — Other Ambulatory Visit: Payer: Self-pay | Admitting: Internal Medicine

## 2022-02-18 ENCOUNTER — Other Ambulatory Visit: Payer: Self-pay | Admitting: Internal Medicine

## 2022-02-25 ENCOUNTER — Ambulatory Visit (INDEPENDENT_AMBULATORY_CARE_PROVIDER_SITE_OTHER): Payer: PPO | Admitting: Internal Medicine

## 2022-02-25 ENCOUNTER — Encounter: Payer: Self-pay | Admitting: Internal Medicine

## 2022-02-25 VITALS — BP 134/88 | HR 80 | Temp 97.7°F | Ht 67.0 in | Wt 231.0 lb

## 2022-02-25 DIAGNOSIS — E611 Iron deficiency: Secondary | ICD-10-CM

## 2022-02-25 DIAGNOSIS — K5790 Diverticulosis of intestine, part unspecified, without perforation or abscess without bleeding: Secondary | ICD-10-CM

## 2022-02-25 DIAGNOSIS — R7301 Impaired fasting glucose: Secondary | ICD-10-CM | POA: Diagnosis not present

## 2022-02-25 DIAGNOSIS — E785 Hyperlipidemia, unspecified: Secondary | ICD-10-CM

## 2022-02-25 DIAGNOSIS — Z0000 Encounter for general adult medical examination without abnormal findings: Secondary | ICD-10-CM | POA: Diagnosis not present

## 2022-02-25 DIAGNOSIS — M85851 Other specified disorders of bone density and structure, right thigh: Secondary | ICD-10-CM

## 2022-02-25 DIAGNOSIS — R5383 Other fatigue: Secondary | ICD-10-CM

## 2022-02-25 DIAGNOSIS — Z1231 Encounter for screening mammogram for malignant neoplasm of breast: Secondary | ICD-10-CM

## 2022-02-25 DIAGNOSIS — I10 Essential (primary) hypertension: Secondary | ICD-10-CM | POA: Diagnosis not present

## 2022-02-25 DIAGNOSIS — D513 Other dietary vitamin B12 deficiency anemia: Secondary | ICD-10-CM

## 2022-02-25 LAB — CBC WITH DIFFERENTIAL/PLATELET
Basophils Absolute: 0 10*3/uL (ref 0.0–0.1)
Basophils Relative: 0.6 % (ref 0.0–3.0)
Eosinophils Absolute: 0.1 10*3/uL (ref 0.0–0.7)
Eosinophils Relative: 2.8 % (ref 0.0–5.0)
HCT: 37.7 % (ref 36.0–46.0)
Hemoglobin: 12.6 g/dL (ref 12.0–15.0)
Lymphocytes Relative: 30.9 % (ref 12.0–46.0)
Lymphs Abs: 1.6 10*3/uL (ref 0.7–4.0)
MCHC: 33.5 g/dL (ref 30.0–36.0)
MCV: 84.7 fl (ref 78.0–100.0)
Monocytes Absolute: 0.4 10*3/uL (ref 0.1–1.0)
Monocytes Relative: 8.6 % (ref 3.0–12.0)
Neutro Abs: 3 10*3/uL (ref 1.4–7.7)
Neutrophils Relative %: 57.1 % (ref 43.0–77.0)
Platelets: 194 10*3/uL (ref 150.0–400.0)
RBC: 4.45 Mil/uL (ref 3.87–5.11)
RDW: 14 % (ref 11.5–15.5)
WBC: 5.2 10*3/uL (ref 4.0–10.5)

## 2022-02-25 LAB — COMPREHENSIVE METABOLIC PANEL
ALT: 13 U/L (ref 0–35)
AST: 16 U/L (ref 0–37)
Albumin: 4.3 g/dL (ref 3.5–5.2)
Alkaline Phosphatase: 86 U/L (ref 39–117)
BUN: 18 mg/dL (ref 6–23)
CO2: 29 mEq/L (ref 19–32)
Calcium: 9.1 mg/dL (ref 8.4–10.5)
Chloride: 104 mEq/L (ref 96–112)
Creatinine, Ser: 0.88 mg/dL (ref 0.40–1.20)
GFR: 66.52 mL/min (ref >60.00)
Glucose, Bld: 90 mg/dL (ref 70–99)
Potassium: 3.8 mEq/L (ref 3.5–5.1)
Sodium: 141 mEq/L (ref 135–145)
Total Bilirubin: 0.6 mg/dL (ref 0.2–1.2)
Total Protein: 6.9 g/dL (ref 6.0–8.3)

## 2022-02-25 LAB — LIPID PANEL
Cholesterol: 141 mg/dL (ref 0–200)
HDL: 50.7 mg/dL (ref >39.00)
LDL Cholesterol: 70 mg/dL (ref 0–99)
NonHDL: 90.51
Total CHOL/HDL Ratio: 3
Triglycerides: 101 mg/dL (ref 0.0–149.0)
VLDL: 20.2 mg/dL (ref 0.0–40.0)

## 2022-02-25 LAB — IBC + FERRITIN
Ferritin: 21.8 ng/mL (ref 10.0–291.0)
Iron: 75 ug/dL (ref 42–145)
Saturation Ratios: 19.3 % — ABNORMAL LOW (ref 20.0–50.0)
TIBC: 387.8 ug/dL (ref 250.0–450.0)
Transferrin: 277 mg/dL (ref 212.0–360.0)

## 2022-02-25 LAB — MICROALBUMIN / CREATININE URINE RATIO
Creatinine,U: 112.6 mg/dL
Microalb Creat Ratio: 2.2 mg/g (ref 0.0–30.0)
Microalb, Ur: 2.5 mg/dL — ABNORMAL HIGH (ref 0.0–1.9)

## 2022-02-25 LAB — HEMOGLOBIN A1C: Hgb A1c MFr Bld: 5.4 % (ref 4.6–6.5)

## 2022-02-25 LAB — LDL CHOLESTEROL, DIRECT: Direct LDL: 82 mg/dL

## 2022-02-25 LAB — VITAMIN B12: Vitamin B-12: >1500 pg/mL — ABNORMAL HIGH (ref 211–911)

## 2022-02-25 LAB — TSH: TSH: 1.78 u[IU]/mL (ref 0.35–5.50)

## 2022-02-25 MED ORDER — HYDROCHLOROTHIAZIDE 12.5 MG PO CAPS: 12.5000 mg | ORAL_CAPSULE | Freq: Every day | ORAL | 1 refills | Status: DC

## 2022-02-25 MED ORDER — ZOSTER VAC RECOMB ADJUVANTED 50 MCG/0.5ML IM SUSR: 0.5000 mL | Freq: Once | INTRAMUSCULAR | 1 refills | Status: AC

## 2022-02-25 MED ORDER — RSVPREF3 VAC RECOMB ADJUVANTED 120 MCG/0.5ML IM SUSR: 0.5000 mL | Freq: Once | INTRAMUSCULAR | 0 refills | Status: AC

## 2022-02-25 NOTE — Assessment & Plan Note (Signed)
  Lab Results  Component Value Date   VITAMINB12 >1500 (H) 02/25/2022

## 2022-02-25 NOTE — Assessment & Plan Note (Signed)
Last DEXA was in 2021

## 2022-02-25 NOTE — Assessment & Plan Note (Signed)

## 2022-02-25 NOTE — Progress Notes (Addendum)
.Patient ID: Jo White, female    DOB: June 23, 1950  Age: 71 y.o. MRN: [id number]  The patient is here for annual  preventive  examination and management of other chronic and acute problems.   The risk factors are reflected in the social history.  The roster of all physicians providing medical care to patient - is listed in the Snapshot section of the chart.  Activities of daily living:  The patient is 100% independent in all ADLs: dressing, toileting, feeding as well as independent mobility  Home safety : The patient has smoke detectors in the home. They wear seatbelts.  There are no firearms at home. There is no violence in the home.   There is no risks for hepatitis, STDs or HIV. There is no   history of blood transfusion. They have no travel history to infectious disease endemic areas of the world.  The patient has seen their dentist in the last six month. They have seen their eye doctor in the last year. They admit to slight hearing difficulty with regard to whispered voices and some television programs.  They have deferred audiologic testing in the last year.  They do not  have excessive sun exposure. Discussed the need for sun protection: hats, long sleeves and use of sunscreen if there is significant sun exposure.   Diet: the importance of a healthy diet is discussed. They do have a healthy diet.  The benefits of regular aerobic exercise were discussed. She walks 4 times per week ,  20 minutes.   Depression screen: there are no signs or vegative symptoms of depression- irritability, change in appetite, anhedonia, sadness/tearfullness.  Cognitive assessment: the patient manages all their financial and personal affairs and is actively engaged. They could relate day,date,year and events; recalled 2/3 objects at 3 minutes; performed clock-face test normally.  The following portions of the patient's history were reviewed and updated as appropriate: allergies, current medications, past  family history, past medical history,  past surgical history, past social history  and problem list.  Visual acuity was not assessed per patient preference since she has regular follow up with her ophthalmologist. Hearing and body mass index were assessed and reviewed.   During the course of the visit the patient was educated and counseled about appropriate screening and preventive services including : fall prevention , diabetes screening, nutrition counseling, colorectal cancer screening, and recommended immunizations.    CC: The primary encounter diagnosis was Routine general medical examination at a health care facility. Diagnoses of Encounter for screening mammogram for malignant neoplasm of breast, Essential hypertension, Hyperlipidemia LDL goal <100, Impaired fasting glucose, Other fatigue, Diverticulosis, Other dietary vitamin B12 deficiency anemia, Iron deficiency, and Osteopenia of neck of right femur were also pertinent to this visit.  History Jo White has a past medical history of Anemia, Basal cell carcinoma (12/19/2019), Blood in stool, Cancer ([place]), Chronic heel pain (12/22/5782), Complication of anesthesia, External hemorrhoids without mention of complication, Globus sensation (01/23/2020), Internal hemorrhoids with other complication, Otalgia, unspecified, Pain in joint, shoulder region (08/19/2014), Personal history of unspecified circulatory disease, PONV (postoperative nausea and vomiting), Squamous cell carcinoma of skin (12/06/2017), Squamous cell carcinoma of skin (12/06/2017), Swelling, mass, or lump in head and neck, and UTI (urinary tract infection) (07-2015).   She has a past surgical history that includes fallopian tube removal; Appendectomy; in vitro fertilization (1985); Breast biopsy; Hemorrhoid surgery (11/2006); Diagnostic laparoscopy; Colonoscopy; Transurethral resection of bladder tumor (N/A, 08/06/2015); and Cystoscopy.   Her family history includes Bradycardia  in her mother;  Breast cancer in her sister; Cancer in her sister; Colon polyps in her mother; Diabetes in her paternal grandmother, sister, and sister; Diverticulosis in her mother; Heart attack in her father; Heart disease in her mother; Heart disease (age of onset: 40) in her father; Heart failure in her mother; Hyperlipidemia in her sister; Hypertension in her brother and mother; Skin cancer in her sister.She reports that she quit smoking about 50 years ago. Her smoking use included cigarettes. She has a 1.50 pack-year smoking history. She has never used smokeless tobacco. She reports current alcohol use. She reports that she does not use drugs.  Outpatient Medications Prior to Visit  Medication Sig Dispense Refill   albuterol (VENTOLIN HFA) 108 (90 Base) MCG/ACT inhaler Inhale 2 puffs into the lungs every 6 (six) hours as needed for wheezing or shortness of breath. 3.7 g 11   cyanocobalamin (VITAMIN B12) 1000 MCG/ML injection INJECT 1 ML INTRAMUSCULARLY ONCE A WEEK FOR 3 WEEKS  THEN MONTHLY. 10 mL 3   meloxicam (MOBIC) 15 MG tablet Take 15 mg by mouth daily. Take 1 tablet (28m) by mouth daily     rosuvastatin (CRESTOR) 10 MG tablet TAKE 1 TABLET BY MOUTH DAILY 90 tablet 0   losartan (COZAAR) 50 MG tablet TAKE 1 TABLET BY MOUTH NIGHTLY 90 tablet 0   cholecalciferol (VITAMIN D3) 25 MCG (1000 UNIT) tablet Take 1,000 Units by mouth daily. (Patient not taking: Reported on 02/25/2022)     Facility-Administered Medications Prior to Visit  Medication Dose Route Frequency Provider Last Rate Last Admin   0.9 %  sodium chloride infusion  500 mL Intravenous Continuous JMilus Banister MD        Review of Systems  Patient denies headache, fevers, malaise, unintentional weight loss, skin rash, eye pain, sinus congestion and sinus pain, sore throat, dysphagia,  hemoptysis , cough, dyspnea, wheezing, chest pain, palpitations, orthopnea, edema, abdominal pain, nausea, melena, diarrhea, constipation, flank pain, dysuria,  hematuria, urinary  Frequency, nocturia, numbness, tingling, seizures,  Focal weakness, Loss of consciousness,  Tremor, insomnia, depression, anxiety, and suicidal ideation.     Objective:  BP 134/88 (BP Location: Left Arm, Patient Position: Sitting, Cuff Size: Large)   Pulse 80   Temp 97.7 F (36.5 C) (Oral)   Ht _0  (1.702 m)   Wt 231 lb (104.8 kg)   SpO2 97%   BMI 36.18 kg/m   Physical Exam . General appearance: alert, cooperative and appears stated age Head: Normocephalic, without obvious abnormality, atraumatic Eyes: conjunctivae/corneas clear. PERRL, EOM's intact. Fundi benign. Ears: normal TM's and external ear canals both ears Nose: Nares normal. Septum midline. Mucosa normal. No drainage or sinus tenderness. Throat: lips, mucosa, and tongue normal; teeth and gums normal Neck: no adenopathy, no carotid bruit, no JVD, supple, symmetrical, trachea midline and thyroid not enlarged, symmetric, no tenderness/mass/nodules Lungs: clear to auscultation bilaterally Breasts: normal appearance, no masses or tenderness Heart: regular rate and rhythm, S1, S2 normal, no murmur, click, rub or gallop Abdomen: soft, non-tender; bowel sounds normal; no masses,  no organomegaly Extremities: extremities normal, atraumatic, no cyanosis or edema Pulses: 2+ and symmetric Skin: Skin color, texture, turgor normal. No rashes or lesions Neurologic: Alert and oriented X 3, normal strength and tone. Normal symmetric reflexes. Normal coordination and gait.     Assessment & Plan:   Problem List Items Addressed This Visit     Hyperlipidemia LDL goal <100    Tolerating rosuvastatin 10 mg daily  Since  October 2022 .for elevated 10 yr risk of CAD and strong FH. LDL is at goal   Lab Results  Component Value Date   CHOL 141 02/25/2022   HDL 50.70 02/25/2022   LDLCALC 70 02/25/2022   LDLDIRECT 82.0 02/25/2022   TRIG 101.0 02/25/2022   CHOLHDL 3 02/25/2022         Relevant Medications    hydrochlorothiazide (MICROZIDE) 12.5 MG capsule   losartan (COZAAR) 100 MG tablet   Other Relevant Orders   Lipid Profile (Completed)   Direct LDL (Completed)   Osteopenia    Last DEXA was in 2021      B12 deficiency anemia      Lab Results  Component Value Date   VITAMINB12 >1500 (H) 02/25/2022         Relevant Orders   Vitamin B12 (Completed)   Breast cancer screening   Relevant Orders   MM 3D SCREEN BREAST BILATERAL   Essential hypertension    WITH New on set microalbuminuria  Advised to increase losartan to 100 mg daily       Relevant Medications   hydrochlorothiazide (MICROZIDE) 12.5 MG capsule   losartan (COZAAR) 100 MG tablet   Other Relevant Orders   Comp Met (CMET) (Completed)   Urine Microalbumin w/creat. ratio (Completed)   Routine general medical examination at a health care facility - Primary    age appropriate education and counseling updated, referrals for preventative services and immunizations addressed, dietary and smoking counseling addressed, most recent labs reviewed.  I have personally reviewed and have noted:   1) the patient's medical and social history 2) The pt's use of alcohol, tobacco, and illicit drugs 3) The patient's current medications and supplements 4) Functional ability including ADL's, fall risk, home safety risk, hearing and visual impairment 5) Diet and physical activities 6) Evidence for depression or mood disorder 7) The patient's height, weight, and BMI have been recorded in the chart  I have made referrals, and provided counseling and education based on review of the above      Diverticulosis   Other Visit Diagnoses     Impaired fasting glucose       Relevant Orders   Comp Met (CMET) (Completed)   HgB A1c (Completed)   Urine Microalbumin w/creat. ratio (Completed)   Other fatigue       Relevant Orders   CBC with Differential/Platelet (Completed)   TSH (Completed)   Iron deficiency       Relevant Orders   IBC +  Ferritin (Completed)       I have changed Jo White's losartan. I am also having her start on hydrochlorothiazide, Zoster Vaccine Adjuvanted, and RSV vaccine recomb adjuvanted. Additionally, I am having her maintain her cholecalciferol, albuterol, meloxicam, rosuvastatin, and cyanocobalamin. We will continue to administer sodium chloride.  Meds ordered this encounter  Medications   hydrochlorothiazide (MICROZIDE) 12.5 MG capsule    Sig: Take 1 capsule (12.5 mg total) by mouth daily.    Dispense:  90 capsule    Refill:  1   Zoster Vaccine Adjuvanted [facility]) injection    Sig: Inject 0.5 mLs into the muscle once for 1 dose.    Dispense:  1 each    Refill:  1   RSV vaccine recomb adjuvanted (AREXVY) 120 MCG/0.5ML injection    Sig: Inject 0.5 mLs into the muscle once for 1 dose.    Dispense:  0.5 mL    Refill:  0   losartan (COZAAR) 100 MG  tablet    Sig: Take 1 tablet (100 mg total) by mouth at bedtime.    Dispense:  90 tablet    Refill:  1    FOR NEXT FILL    Medications Discontinued During This Encounter  Medication Reason   losartan (COZAAR) 50 MG tablet     Follow-up: Return in about 6 months (around 08/27/2022).   Crecencio Mc, MD

## 2022-02-25 NOTE — Assessment & Plan Note (Signed)
Tolerating rosuvastatin 10 mg daily  Since October 2022 .for elevated 10 yr risk of CAD and strong FH. LDL is at goal   Lab Results  Component Value Date   CHOL 141 02/25/2022   HDL 50.70 02/25/2022   LDLCALC 70 02/25/2022   LDLDIRECT 82.0 02/25/2022   TRIG 101.0 02/25/2022   CHOLHDL 3 02/25/2022

## 2022-02-25 NOTE — Patient Instructions (Addendum)
  The new goals for optimal blood pressure management are 120/70 to 130/80.  I am adding 12.5 mg hctz to take daily IN THE MORNING.  Continue losartan in the evening   Please consider purchasing an OMRON blood pressure monitor and start  checking  your blood pressure  once a week  at home .    send me the readings so I can determine if you need a change in medication     Your annual mammogram has been ordered . You can schedule your own appt any time with [facility]  RSV and Shinrix vaccines are recommended  One more pneumonia vaccine (can wait until next visit )  Return in 6 months

## 2022-02-28 MED ORDER — LOSARTAN POTASSIUM 100 MG PO TABS: 100.0000 mg | ORAL_TABLET | Freq: Every day | ORAL | 1 refills | Status: DC

## 2022-02-28 NOTE — Assessment & Plan Note (Signed)
WITH New on set microalbuminuria  Advised to increase losartan to 100 mg daily

## 2022-02-28 NOTE — Addendum Note (Signed)
Addended by: Crecencio Mc on: 02/28/2022 01:18 PM   Modules accepted: Orders

## 2022-03-01 ENCOUNTER — Encounter: Payer: Self-pay | Admitting: Internal Medicine

## 2022-03-03 ENCOUNTER — Encounter: Payer: Self-pay | Admitting: Internal Medicine

## 2022-03-18 ENCOUNTER — Encounter: Payer: Self-pay | Admitting: Internal Medicine

## 2022-03-18 ENCOUNTER — Telehealth: Payer: Self-pay | Admitting: Internal Medicine

## 2022-03-18 DIAGNOSIS — Z1231 Encounter for screening mammogram for malignant neoplasm of breast: Secondary | ICD-10-CM | POA: Diagnosis not present

## 2022-03-18 LAB — HM MAMMOGRAPHY

## 2022-03-18 NOTE — Telephone Encounter (Signed)
Pt called wanting to know if she should come in for a two week follow-up for her BP check or continue to monitor it at home

## 2022-03-19 ENCOUNTER — Other Ambulatory Visit: Payer: Self-pay

## 2022-03-19 ENCOUNTER — Emergency Department
Admission: EM | Admit: 2022-03-19 | Discharge: 2022-03-20 | Disposition: A | Discharge status: Home / Self Care | Payer: PPO | Attending: Emergency Medicine | Admitting: Emergency Medicine

## 2022-03-19 DIAGNOSIS — B9620 Unspecified Escherichia coli [E. coli] as the cause of diseases classified elsewhere: Secondary | ICD-10-CM | POA: Insufficient documentation

## 2022-03-19 DIAGNOSIS — Z8551 Personal history of malignant neoplasm of bladder: Secondary | ICD-10-CM | POA: Insufficient documentation

## 2022-03-19 DIAGNOSIS — Z79899 Other long term (current) drug therapy: Secondary | ICD-10-CM | POA: Diagnosis not present

## 2022-03-19 DIAGNOSIS — N390 Urinary tract infection, site not specified: Secondary | ICD-10-CM | POA: Diagnosis not present

## 2022-03-19 DIAGNOSIS — I10 Essential (primary) hypertension: Secondary | ICD-10-CM | POA: Diagnosis not present

## 2022-03-19 DIAGNOSIS — Z85828 Personal history of other malignant neoplasm of skin: Secondary | ICD-10-CM | POA: Insufficient documentation

## 2022-03-19 DIAGNOSIS — R519 Headache, unspecified: Secondary | ICD-10-CM | POA: Diagnosis not present

## 2022-03-19 LAB — CBC
HCT: 41.1 % (ref 36.0–46.0)
Hemoglobin: 13.8 g/dL (ref 12.0–15.0)
MCH: 28 pg (ref 26.0–34.0)
MCHC: 33.6 g/dL (ref 30.0–36.0)
MCV: 83.4 fL (ref 80.0–100.0)
Platelets: 283 10*3/uL (ref 150–400)
RBC: 4.93 MIL/uL (ref 3.87–5.11)
RDW: 13.2 % (ref 11.5–15.5)
WBC: 9.4 10*3/uL (ref 4.0–10.5)
nRBC: 0 % (ref 0.0–0.2)

## 2022-03-19 LAB — BASIC METABOLIC PANEL
Anion gap: 10 (ref 5–15)
BUN: 11 mg/dL (ref 8–23)
CO2: 24 mmol/L (ref 22–32)
Calcium: 9.3 mg/dL (ref 8.9–10.3)
Chloride: 107 mmol/L (ref 98–111)
Creatinine, Ser: 0.88 mg/dL (ref 0.44–1.00)
GFR, Estimated: >60 mL/min (ref >60)
Glucose, Bld: 139 mg/dL — ABNORMAL HIGH (ref 70–99)
Potassium: 3.4 mmol/L — ABNORMAL LOW (ref 3.5–5.1)
Sodium: 141 mmol/L (ref 135–145)

## 2022-03-19 LAB — TROPONIN I (HIGH SENSITIVITY)
Troponin I (High Sensitivity): 5 ng/L (ref <18)
Troponin I (High Sensitivity): 7 ng/L (ref <18)

## 2022-03-19 NOTE — Telephone Encounter (Signed)
Reviewed message about episode today.  Please call and confirm she is doing ok and see how her blood pressure is doing now.  I am ok to see her tomorrow unless acute symptoms or problems that she would need to be seen earlier (this pm).

## 2022-03-19 NOTE — ED Provider Notes (Signed)
[facility] Provider Note    Event Date/Time   First MD Initiated Contact with Patient 03/19/22 2342     (approximate)   History   Hypertension   HPI  Jo White is a 70 y.o. female who presents to the ED from home with a chief complaint of elevated blood pressure.  Patient with a history of hypertension who recently had her medications increased: Losartan increased from 50 mg to 100 mg; 12.5 mg HCTZ added.  These changes were on 03/08/2022.  Patient reports compliance with her medications.  She had just submitted a blood pressure log with blood pressures in the 120s to her doctor.  Did not take her blood pressure yesterday and today they have been elevated; highest systolic 235.  Reports mild headache and dizziness.  Felt flushed.  Recent chest cold with cough and congestion; took a negative home COVID test.  Denies fevers, chills, changes, chest pain, shortness of breath, abdominal pain, nausea, vomiting, diarrhea.  Denies sick contacts.     Past Medical History   Past Medical History:  Diagnosis Date   Anemia    Basal cell carcinoma 12/19/2019   R ant lat thigh   Blood in stool    Cancer ([place])    bladder   Chronic heel pain 09/22/3218   Complication of anesthesia    External hemorrhoids without mention of complication    Globus sensation 01/23/2020   Internal hemorrhoids with other complication    Otalgia, unspecified    Pain in joint, shoulder region 08/19/2014   Personal history of unspecified circulatory disease    PONV (postoperative nausea and vomiting)    Squamous cell carcinoma of skin 12/06/2017   L ant thigh    Squamous cell carcinoma of skin 12/06/2017   R mid lat pretibial   Swelling, mass, or lump in head and neck    UTI (urinary tract infection) 07-2015     Active Problem List   Patient Active Problem List   Diagnosis Date Noted   Diverticulosis 02/25/2022   Bronchitis with acute wheezing 01/27/2021   PND (post-nasal drip)  01/23/2020   Routine general medical examination at a health care facility 01/23/2020   Essential hypertension 01/21/2019   History of bladder cancer 05/31/2016   Obesity 05/27/2013   Breast cancer screening 11/24/2012   B12 deficiency anemia 11/16/2012   Vitamin D deficiency 11/16/2012   Lipoma of neck 01/12/2012   Family history of MI (myocardial infarction) 05/08/2011   Hyperlipidemia LDL goal <100 09/04/2010   Osteopenia 09/04/2010   HEART MURMUR, HX OF 10/15/2006     Past Surgical History   Past Surgical History:  Procedure Laterality Date   APPENDECTOMY     BREAST BIOPSY     right   COLONOSCOPY     CYSTOSCOPY     DIAGNOSTIC LAPAROSCOPY     MULITPLE FOR IN VITRO   fallopian tube removal     r tube   HEMORRHOID SURGERY  11/2006   in vitro fertilization  [place] TUMOR N/A 08/06/2015   Procedure: TRANSURETHRAL RESECTION OF BLADDER TUMOR (TURBT);  Surgeon: Royston Cowper, MD;  Location: [facility] ORS;  Service: Urology;  Laterality: N/A;     Home Medications   Prior to Admission medications   Medication Sig Start Date End Date Taking? Authorizing Provider  cephALEXin (KEFLEX) 500 MG capsule Take 1 capsule (500 mg total) by mouth 3 (three) times daily. 03/20/22  Yes Beather Arbour,  Gretchen Short, MD  cyanocobalamin (VITAMIN B12) 1000 MCG/ML injection INJECT 1 ML INTRAMUSCULARLY ONCE A WEEK FOR 3 WEEKS  THEN MONTHLY. 02/18/22  Yes Crecencio Mc, MD  hydrochlorothiazide (MICROZIDE) 12.5 MG capsule Take 1 capsule (12.5 mg total) by mouth daily. 02/25/22  Yes Crecencio Mc, MD  losartan (COZAAR) 100 MG tablet Take 1 tablet (100 mg total) by mouth at bedtime. 02/28/22  Yes Crecencio Mc, MD  meloxicam (MOBIC) 15 MG tablet Take 15 mg by mouth daily. Take 1 tablet ('15mg'$ ) by mouth daily   Yes [provider]  rosuvastatin (CRESTOR) 10 MG tablet TAKE 1 TABLET BY MOUTH DAILY 02/10/22  Yes Crecencio Mc, MD  albuterol (VENTOLIN HFA) 108 (90 Base) MCG/ACT  inhaler Inhale 2 puffs into the lungs every 6 (six) hours as needed for wheezing or shortness of breath. 01/27/21   Crecencio Mc, MD  cholecalciferol (VITAMIN D3) 25 MCG (1000 UNIT) tablet Take 1,000 Units by mouth daily. Patient not taking: Reported on 02/25/2022    [provider]     Allergies  Patient has no known allergies.   Family History   Family History  Problem Relation Age of Onset   Heart disease Father 19       secodnary to Bright's disease   Heart attack Father    Hypertension Mother    Bradycardia Mother    Heart failure Mother    Colon polyps Mother    Diverticulosis Mother    Heart disease Mother    Diabetes Sister    Hyperlipidemia Sister    Cancer Sister        breast   Breast cancer Sister    Hypertension Brother    Diabetes Paternal Grandmother    Diabetes Sister    Skin cancer Sister    Colon cancer Neg Hx      Physical Exam  Triage Vital Signs: ED Triage Vitals  Enc Vitals Group     BP 03/19/22 1755 (!) 179/103     Pulse Rate 03/19/22 1755 (!) 114     Resp 03/19/22 1755 18     Temp 03/19/22 1755 98.5 F (36.9 C)     Temp Source 03/19/22 2109 Oral     SpO2 03/19/22 1755 99 %     Weight --      Height --      Head Circumference --      Peak Flow --      Pain Score 03/19/22 1754 2     Pain Loc --      Pain Edu? --      Excl. in [place]? --     Updated Vital Signs: BP (!) 157/83 (BP Location: Left Arm)   Pulse 81   Temp 98.4 F (36.9 C) (Oral)   Resp (!) 84   SpO2 97%    General: Awake, no distress.  CV:  RRR.  Good peripheral perfusion.  Resp:  Normal effort.  CTA B. Abd:  Nontender.  No distention.  Other:  No carotid bruits.  Alert and oriented x3.  CN II-XII grossly intact.  5/5 motor strength and sensation all extremities. MAEx4.    ED Results / Procedures / Treatments  Labs (all labs ordered are listed, but only abnormal results are displayed) Labs Reviewed  BASIC METABOLIC PANEL - Abnormal; Notable for the  following components:      Result Value   Potassium 3.4 (*)    Glucose, Bld 139 (*)  All other components within normal limits  URINALYSIS, ROUTINE W REFLEX MICROSCOPIC - Abnormal; Notable for the following components:   Color, Urine YELLOW (*)    APPearance CLEAR (*)    Hgb urine dipstick MODERATE (*)    Nitrite POSITIVE (*)    Leukocytes,Ua TRACE (*)    Bacteria, UA MANY (*)    All other components within normal limits  URINE CULTURE  CBC  TROPONIN I (HIGH SENSITIVITY)  TROPONIN I (HIGH SENSITIVITY)     EKG  ED ECG REPORT I, Mylon Mabey J, the attending physician, personally viewed and interpreted this ECG.   Date: 03/20/2022  EKG Time: 1756  Rate: 123  Rhythm: sinus tachycardia  Axis: Normal  Intervals:none  ST&T Change: Nonspecific    RADIOLOGY I have independently visualized and interpreted patient's CT and chest x-ray as well as noted the radiology interpretation:  CT head: No ICH  Chest x-ray: No acute cardiopulmonary process  Official radiology report(s): DG Chest 2 View  Result Date: 03/20/2022 CLINICAL DATA:  Hypertension EXAM: CHEST - 2 VIEW COMPARISON:  None Available. FINDINGS: The heart size and mediastinal contours are within normal limits. Both lungs are clear. The visualized skeletal structures are unremarkable. IMPRESSION: No active cardiopulmonary disease. Electronically Signed   By: Fidela Salisbury M.D.   On: 03/20/2022 01:03   CT Head Wo Contrast  Result Date: 03/20/2022 CLINICAL DATA:  Headache, new or worsening (Age >= 50y) EXAM: CT HEAD WITHOUT CONTRAST TECHNIQUE: Contiguous axial images were obtained from the base of the skull through the vertex without intravenous contrast. RADIATION DOSE REDUCTION: This exam was performed according to the departmental dose-optimization program which includes automated exposure control, adjustment of the mA and/or kV according to patient size and/or use of iterative reconstruction technique. COMPARISON:  None  Available. FINDINGS: Brain: No acute intracranial abnormality. Specifically, no hemorrhage, hydrocephalus, mass lesion, acute infarction, or significant intracranial injury. Vascular: No hyperdense vessel or unexpected calcification. Skull: No acute calvarial abnormality. Sinuses/Orbits: No acute findings Other: None IMPRESSION: No acute intracranial abnormality. Electronically Signed   By: Rolm Baptise M.D.   On: 03/20/2022 00:30     PROCEDURES:  Critical Care performed: No  .1-3 Lead EKG Interpretation  Performed by: Paulette Blanch, MD Authorized by: Paulette Blanch, MD     Interpretation: normal     ECG rate:  90   ECG rate assessment: normal     Rhythm: sinus rhythm     Ectopy: none     Conduction: normal   Comments:     Patient placed on cardiac monitor to evaluate for arrhythmias    MEDICATIONS ORDERED IN ED: Medications  losartan (COZAAR) tablet 100 mg (100 mg Oral Given 03/20/22 0016)  cephALEXin (KEFLEX) capsule 500 mg (500 mg Oral Given 03/20/22 0106)  potassium chloride SA (KLOR-CON M) CR tablet 40 mEq (40 mEq Oral Given 03/20/22 0106)     IMPRESSION / MDM / ASSESSMENT AND PLAN / ED COURSE  I reviewed the triage vital signs and the nursing notes.                             71 year old female presenting with elevated blood pressure.  Differential diagnosis includes but is not limited to CVA/TIA, ACS, metabolic, infectious etiology, etc.  I have personally reviewed patient's records and have her PCP office visit from 02/25/2022 and the most recent telephone communications from yesterday.  Patient's presentation is most consistent with acute  presentation with potential threat to life or bodily function.  The patient is on the cardiac monitor to evaluate for evidence of arrhythmia and/or significant heart rate changes.  Laboratory results demonstrate normal WBC, normal electrolytes, 2 sets of negative troponin.  Will obtain CT head, chest x-ray, UA.  Patient missed her  nighttime dose of losartan while being in the ED lobby; will administer.  We will continue to monitor and care for patient.  Clinical Course as of 03/20/22 0409  Fri Mar 20, 2022  0055 UA nitrite and leukocyte positive, will start Keflex.  CT head negative for ICH.  Potassium is 3.4 but will replete orally as patient is recently starting HCTZ. [JS]  0120 BP 150/83.  Patient feeling better.  Chest x-ray negative.  Patient has an appointment with her PCP at 1030 this morning.  Will discharge home with prescription for Keflex.  Strict return precautions given.  Patient and spouse verbalized understanding agree with plan of care. [JS]    Clinical Course User Index [JS] Paulette Blanch, MD     FINAL CLINICAL IMPRESSION(S) / ED DIAGNOSES   Final diagnoses:  Hypertension, unspecified type  Urinary tract infection without hematuria, site unspecified     Rx / DC Orders   ED Discharge Orders          Ordered    cephALEXin (KEFLEX) 500 MG capsule  3 times daily        03/20/22 0121             Note:  This document was prepared using Dragon voice recognition software and may include unintentional dictation errors.   Paulette Blanch, MD 03/20/22 [phone]

## 2022-03-19 NOTE — ED Provider Triage Note (Signed)
Emergency Medicine Provider Triage Evaluation Note  Jo White, a 71 y.o. female  was evaluated in triage.  Pt complains of blood pressure readings.  With a history of hypertension, currently on Cozaar, and HCTZ, presents to the ED at the advice of her primary provider.  She denies any chest pain or shortness of breath.  She does endorse a mild headache.  Review of Systems  Positive: Elevated BP Negative: CP, SOB  Physical Exam  BP (!) 179/103   Pulse (!) 114   Temp 98.5 F (36.9 C)   Resp 18   SpO2 99%  Gen:   Awake, no distress  NAD Resp:  Normal effort CTA MSK:   Moves extremities without difficulty  Other:    Medical Decision Making  Medically screening exam initiated at 6:01 PM.  Appropriate orders placed.  Jo White was informed that the remainder of the evaluation will be completed by another provider, this initial triage assessment does not replace that evaluation, and the importance of remaining in the ED until their evaluation is complete.  Patient to the ED for evaluation of elevated blood pressure readings.   Melvenia Needles, PA-C 03/19/22 1802

## 2022-03-19 NOTE — Telephone Encounter (Signed)
These were the readings before today:  [name]  P [facility] (supporting Crecencio Mc, MD)Yesterday (1:24 PM)    I have added HCTZ  in am increased to 100 mg Losartan eff. 10/22 and per your request, I am sending my last several BP readings.  10/22.    122/79 10/23.    132/80 10/29.    125/77 10/30.     117/78 10/31.     128/79 Will I still need to come in for BP check or does everything look ok? Thanks [employer]

## 2022-03-19 NOTE — Telephone Encounter (Signed)
Called pt back given persistent elevation blood pressure today, etc.  Spoke to Shanon Brow her husband.  Jo White is currently at the emergency room being evaluated.  Will keep [place] posted.

## 2022-03-19 NOTE — Telephone Encounter (Signed)
Spoke with pt again and she stated that she feels fine she has been up moving around and doing things. Pt checked her bp but it was still elevated at 191/106 heart of 100 but she had been doing things right before taking bp. Made sure pt was asymptomatic and she stated that she has no symptoms. Pt stated that she is going to go to her grandson's ball game and maybe it will help her bp come back down because she want be at home worrying about it. Pt was advised that if her bp got any higher or developed any symptoms of headache, blurred vision, chest pain, jaw pain, arm pain or SOBr that she would need to go to the ED immediately. Pt gave a verbal understanding and stated that she will keep the appt with you tomorrow.

## 2022-03-19 NOTE — Telephone Encounter (Signed)
See telephone encounter.

## 2022-03-19 NOTE — Telephone Encounter (Signed)
Spoke with pt to get her bp readings to see if she would need to schedule a follow up or continue monitoring at home. Pt stated that her bp readings have been averaging around 128/79 until about two hours ago. Pt stated that she checked her bp today and it was 160/107, she was not having any symptoms she was just checking her daily reading. After that though she started to feel nervous and her heart rate got up to 107. She was not and is not having any other symptoms. Since she has checked her bp and it has been 181/114, 187/104 and 174/94 about 30 minutes ago. I have scheduled pt with Dr. Nicki Reaper for tomorrow at 10:30.

## 2022-03-19 NOTE — ED Triage Notes (Signed)
Pt comes with c/o HTN. Pt has recent increase in BP meds. Pt states today it has been elevated. Pt states no dizziness or blurry vision. Pt states little headache.

## 2022-03-19 NOTE — ED Triage Notes (Signed)
First Nurse Note:  C/O feeling flushed, jittery and blood pressure being elevated today.    AAOx3.  Skin warm and dry. NAD

## 2022-03-19 NOTE — ED Provider Notes (Incomplete)
[facility] Provider Note    Event Date/Time   First MD Initiated Contact with Patient 03/19/22 2342     (approximate)   History   Hypertension   HPI {Remember to add pertinent medical, surgical, social, and/or OB history to HPI:1} Jo White is a 71 y.o. female  ***       Past Medical History   Past Medical History:  Diagnosis Date  . Anemia   . Basal cell carcinoma 12/19/2019   R ant lat thigh  . Blood in stool   . Cancer ([place])    bladder  . Chronic heel pain 08/19/2014  . Complication of anesthesia   . External hemorrhoids without mention of complication   . Globus sensation 01/23/2020  . Internal hemorrhoids with other complication   . Otalgia, unspecified   . Pain in joint, shoulder region 08/19/2014  . Personal history of unspecified circulatory disease   . PONV (postoperative nausea and vomiting)   . Squamous cell carcinoma of skin 12/06/2017   L ant thigh   . Squamous cell carcinoma of skin 12/06/2017   R mid lat pretibial  . Swelling, mass, or lump in head and neck   . UTI (urinary tract infection) 07-2015     Active Problem List   Patient Active Problem List   Diagnosis Date Noted  . Diverticulosis 02/25/2022  . Bronchitis with acute wheezing 01/27/2021  . PND (post-nasal drip) 01/23/2020  . Routine general medical examination at a health care facility 01/23/2020  . Essential hypertension 01/21/2019  . History of bladder cancer 05/31/2016  . Obesity 05/27/2013  . Breast cancer screening 11/24/2012  . B12 deficiency anemia 11/16/2012  . Vitamin D deficiency 11/16/2012  . Lipoma of neck 01/12/2012  . Family history of MI (myocardial infarction) 05/08/2011  . Hyperlipidemia LDL goal <100 09/04/2010  . Osteopenia 09/04/2010  . HEART MURMUR, HX OF 10/15/2006     Past Surgical History   Past Surgical History:  Procedure Laterality Date  . APPENDECTOMY    . BREAST BIOPSY     right  . COLONOSCOPY    . CYSTOSCOPY     . DIAGNOSTIC LAPAROSCOPY     MULITPLE FOR IN VITRO  . fallopian tube removal     r tube  . HEMORRHOID SURGERY  11/2006  . in vitro fertilization  1985  . TRANSURETHRAL RESECTION OF BLADDER TUMOR N/A 08/06/2015   Procedure: TRANSURETHRAL RESECTION OF BLADDER TUMOR (TURBT);  Surgeon: Royston Cowper, MD;  Location: [facility] ORS;  Service: Urology;  Laterality: N/A;     Home Medications   Prior to Admission medications   Medication Sig Start Date End Date Taking? Authorizing Provider  albuterol (VENTOLIN HFA) 108 (90 Base) MCG/ACT inhaler Inhale 2 puffs into the lungs every 6 (six) hours as needed for wheezing or shortness of breath. 01/27/21   Crecencio Mc, MD  cholecalciferol (VITAMIN D3) 25 MCG (1000 UNIT) tablet Take 1,000 Units by mouth daily. Patient not taking: Reported on 02/25/2022    [provider]  cyanocobalamin (VITAMIN B12) 1000 MCG/ML injection INJECT 1 ML INTRAMUSCULARLY ONCE A WEEK FOR 3 WEEKS  THEN MONTHLY. 02/18/22   Crecencio Mc, MD  hydrochlorothiazide (MICROZIDE) 12.5 MG capsule Take 1 capsule (12.5 mg total) by mouth daily. 02/25/22   Crecencio Mc, MD  losartan (COZAAR) 100 MG tablet Take 1 tablet (100 mg total) by mouth at bedtime. 02/28/22   Crecencio Mc, MD  meloxicam (MOBIC) 15  MG tablet Take 15 mg by mouth daily. Take 1 tablet ('15mg'$ ) by mouth daily    [provider]  rosuvastatin (CRESTOR) 10 MG tablet TAKE 1 TABLET BY MOUTH DAILY 02/10/22   Crecencio Mc, MD     Allergies  Patient has no known allergies.   Family History   Family History  Problem Relation Age of Onset  . Heart disease Father 55       secodnary to Bright's disease  . Heart attack Father   . Hypertension Mother   . Bradycardia Mother   . Heart failure Mother   . Colon polyps Mother   . Diverticulosis Mother   . Heart disease Mother   . Diabetes Sister   . Hyperlipidemia Sister   . Cancer Sister        breast  . Breast cancer Sister   . Hypertension  Brother   . Diabetes Paternal Grandmother   . Diabetes Sister   . Skin cancer Sister   . Colon cancer Neg Hx      Physical Exam  Triage Vital Signs: ED Triage Vitals  Enc Vitals Group     BP 03/19/22 1755 (!) 179/103     Pulse Rate 03/19/22 1755 (!) 114     Resp 03/19/22 1755 18     Temp 03/19/22 1755 98.5 F (36.9 C)     Temp Source 03/19/22 2109 Oral     SpO2 03/19/22 1755 99 %     Weight --      Height --      Head Circumference --      Peak Flow --      Pain Score 03/19/22 1754 2     Pain Loc --      Pain Edu? --      Excl. in [place]? --     Updated Vital Signs: BP (!) 172/95 (BP Location: Left Arm)   Pulse 94   Temp 98.5 F (36.9 C) (Oral)   Resp 20   SpO2 98%   {Only need to document appropriate and relevant physical exam:1} General: Awake, no distress. *** CV:  Good peripheral perfusion. *** Resp:  Normal effort. *** Abd:  No distention. *** Other:  ***   ED Results / Procedures / Treatments  Labs (all labs ordered are listed, but only abnormal results are displayed) Labs Reviewed  BASIC METABOLIC PANEL - Abnormal; Notable for the following components:      Result Value   Potassium 3.4 (*)    Glucose, Bld 139 (*)    All other components within normal limits  CBC  URINALYSIS, ROUTINE W REFLEX MICROSCOPIC  TROPONIN I (HIGH SENSITIVITY)  TROPONIN I (HIGH SENSITIVITY)     EKG  ***   RADIOLOGY *** {You MUST document your own interpretation of imaging, as well as the fact that you reviewed the radiologist's report!:1}  Official radiology report(s): No results found.   PROCEDURES:  Critical Care performed: {CriticalCareYesNo:19197::"Yes, see critical care procedure note(s)","No"}  Procedures   MEDICATIONS ORDERED IN ED: Medications - No data to display   IMPRESSION / MDM / [place] / ED COURSE  I reviewed the triage vital signs and the nursing notes.                              Differential diagnosis includes, but is  not limited to, ***  Patient's presentation is most consistent with {EM COPA:27473}  {If  the patient is on the monitor, remove the brackets and asterisks on the sentence below and remember to document it as a Procedure as well. Otherwise delete the sentence below:1} {**The patient is on the cardiac monitor to evaluate for evidence of arrhythmia and/or significant heart rate changes.**}  {Remember to include, when applicable, any/all of the following data: independent review of imaging independent review of labs (comment specifically on pertinent positives and negatives) review of specific prior hospitalizations, PCP/specialist notes, etc. discuss meds given and prescribed document any discussion with consultants (including hospitalists) any clinical decision tools you used and why (PECARN, NEXUS, etc.) did you consider admitting the patient? document social determinants of health affecting patient's care (homelessness, inability to follow up in a timely fashion, etc) document any pre-existing conditions increasing risk on current visit (e.g. diabetes and HTN increasing danger of high-risk chest pain/ACS) describes what meds you gave (especially parenteral) and why any other interventions?:1}      FINAL CLINICAL IMPRESSION(S) / ED DIAGNOSES   Final diagnoses:  Hypertension, unspecified type     Rx / DC Orders   ED Discharge Orders     None        Note:  This document was prepared using Dragon voice recognition software and may include unintentional dictation errors.

## 2022-03-20 ENCOUNTER — Ambulatory Visit: Payer: PPO | Admitting: Internal Medicine

## 2022-03-20 ENCOUNTER — Encounter: Payer: Self-pay | Admitting: Internal Medicine

## 2022-03-20 ENCOUNTER — Emergency Department: Payer: PPO

## 2022-03-20 ENCOUNTER — Other Ambulatory Visit: Payer: Self-pay | Admitting: Internal Medicine

## 2022-03-20 VITALS — BP 138/88 | HR 100 | Temp 98.5°F | Resp 18 | Ht 67.0 in | Wt 229.3 lb

## 2022-03-20 DIAGNOSIS — E785 Hyperlipidemia, unspecified: Secondary | ICD-10-CM | POA: Diagnosis not present

## 2022-03-20 DIAGNOSIS — R009 Unspecified abnormalities of heart beat: Secondary | ICD-10-CM

## 2022-03-20 DIAGNOSIS — I10 Essential (primary) hypertension: Secondary | ICD-10-CM | POA: Diagnosis not present

## 2022-03-20 DIAGNOSIS — E876 Hypokalemia: Secondary | ICD-10-CM

## 2022-03-20 DIAGNOSIS — Z8249 Family history of ischemic heart disease and other diseases of the circulatory system: Secondary | ICD-10-CM | POA: Diagnosis not present

## 2022-03-20 DIAGNOSIS — R519 Headache, unspecified: Secondary | ICD-10-CM | POA: Diagnosis not present

## 2022-03-20 DIAGNOSIS — R351 Nocturia: Secondary | ICD-10-CM | POA: Diagnosis not present

## 2022-03-20 DIAGNOSIS — I471 Supraventricular tachycardia, unspecified: Secondary | ICD-10-CM

## 2022-03-20 LAB — URINALYSIS, ROUTINE W REFLEX MICROSCOPIC
Bilirubin Urine: NEGATIVE
Glucose, UA: NEGATIVE mg/dL
Ketones, ur: NEGATIVE mg/dL
Nitrite: POSITIVE — AB
Protein, ur: NEGATIVE mg/dL
Specific Gravity, Urine: 1.01 (ref 1.005–1.030)
pH: 5 (ref 5.0–8.0)

## 2022-03-20 MED ORDER — CEPHALEXIN 500 MG PO CAPS
500.0000 mg | ORAL_CAPSULE | Freq: Once | ORAL | Status: AC
  Administered 2022-03-20: 500 mg via ORAL
  Filled 2022-03-20: qty 1

## 2022-03-20 MED ORDER — LOSARTAN POTASSIUM 50 MG PO TABS
100.0000 mg | ORAL_TABLET | Freq: Once | ORAL | Status: AC
  Administered 2022-03-20: 100 mg via ORAL
  Filled 2022-03-20: qty 2

## 2022-03-20 MED ORDER — POTASSIUM CHLORIDE CRYS ER 20 MEQ PO TBCR
40.0000 meq | EXTENDED_RELEASE_TABLET | Freq: Once | ORAL | Status: AC
  Administered 2022-03-20: 40 meq via ORAL
  Filled 2022-03-20: qty 2

## 2022-03-20 MED ORDER — CEPHALEXIN 500 MG PO CAPS: 500.0000 mg | ORAL_CAPSULE | Freq: Three times a day (TID) | ORAL | 0 refills | Status: DC

## 2022-03-20 NOTE — Progress Notes (Unsigned)
Patient ID: Jo White, female   DOB: Jul 26, 1950, 71 y.o.   MRN: [id number]   Subjective:    Patient ID: Jo White, female    DOB: 1950-08-23, 71 y.o.   MRN: [id number]   Patient here for  Chief Complaint  Patient presents with   Hypertension   .   HPI Work in appt. Pt of Dr Derrel Nip.  Recently increased losartan to '100mg'$  q day and added hctz 12.'5mg'$  q day.  Blood pressures had been doing well.  Yesterday noticed elevated blood pressure 160/107.  Continued to spot check yesterday and blood pressure remained elevated (increased) and heart rate increased.  Given persistence, went to ER yesterday pm.  Labs unrevealing - potassium 3.4.  s/p EKG and troponin x 2 ok.  Was diagnosed with UTI.  Started on abx.  Reports no significant symptoms or issues yesterday that she felt would trigger an elevated blood pressure.  Does report some nocturia.  Discussed.  Does snore. Some daytime fatigue.  Possible increased urinary frequency, but no dysuria or hematuria.  Does report left lower back/buttock/hip - discomfort.  Some better now.  No chest pain.  Breathing stable.  Some sob with exertion - unchanged.  No cough or congestion.  Slept well last night.    Past Medical History:  Diagnosis Date   Anemia    Basal cell carcinoma 12/19/2019   R ant lat thigh   Blood in stool    Cancer ([place])    bladder   Chronic heel pain 10/17/353   Complication of anesthesia    External hemorrhoids without mention of complication    Globus sensation 01/23/2020   Internal hemorrhoids with other complication    Otalgia, unspecified    Pain in joint, shoulder region 08/19/2014   Personal history of unspecified circulatory disease    PONV (postoperative nausea and vomiting)    Squamous cell carcinoma of skin 12/06/2017   L ant thigh    Squamous cell carcinoma of skin 12/06/2017   R mid lat pretibial   Swelling, mass, or lump in head and neck    UTI (urinary tract infection) 07-2015   Past Surgical History:  Procedure  Laterality Date   APPENDECTOMY     BREAST BIOPSY     right   COLONOSCOPY     CYSTOSCOPY     DIAGNOSTIC LAPAROSCOPY     MULITPLE FOR IN VITRO   fallopian tube removal     r tube   HEMORRHOID SURGERY  11/2006   in vitro fertilization  [place] TUMOR N/A 08/06/2015   Procedure: TRANSURETHRAL RESECTION OF BLADDER TUMOR (TURBT);  Surgeon: Royston Cowper, MD;  Location: [facility] ORS;  Service: Urology;  Laterality: N/A;   Family History  Problem Relation Age of Onset   Heart disease Father 70       secodnary to Bright's disease   Heart attack Father    Hypertension Mother    Bradycardia Mother    Heart failure Mother    Colon polyps Mother    Diverticulosis Mother    Heart disease Mother    Diabetes Sister    Hyperlipidemia Sister    Cancer Sister        breast   Breast cancer Sister    Hypertension Brother    Diabetes Paternal Grandmother    Diabetes Sister    Skin cancer Sister    Colon cancer Neg Hx    Social History   Socioeconomic  History   Marital status: Married    Spouse name: Not on file   Number of children: 2   Years of education: Not on file   Highest education level: Not on file  Occupational History   Occupation: TECH    Employer: [employer]  Tobacco Use   Smoking status: Former    Packs/day: 1.00    Years: 1.50    Total pack years: 1.50    Types: Cigarettes    Quit date: 01/11/1972    Years since quitting: 50.2   Smokeless tobacco: Never   Tobacco comments:    1 1/2 years as a teen  Media planner   Vaping Use: Never used  Substance and Sexual Activity   Alcohol use: Yes    Comment: rare alcohol intake   Drug use: No   Sexual activity: Not on file  Other Topics Concern   Not on file  Social History Narrative   No regular exercise, some walking   Diet- some fruit and veggies, avoid fast food   Social Determinants of Health   Financial Resource Strain: Low Risk  (01/26/2022)   Overall Financial Resource  Strain (CARDIA)    Difficulty of Paying Living Expenses: Not hard at all  Food Insecurity: No Food Insecurity (01/26/2022)   Hunger Vital Sign    Worried About Running Out of Food in the Last Year: Never true    Ran Out of Food in the Last Year: Never true  Transportation Needs: No Transportation Needs (01/26/2022)   PRAPARE - Hydrologist (Medical): No    Lack of Transportation (Non-Medical): No  Physical Activity: Sufficiently Active (12/24/2020)   Exercise Vital Sign    Days of Exercise per Week: 5 days    Minutes of Exercise per Session: 30 min  Stress: No Stress Concern Present (01/26/2022)   [place]    Feeling of Stress : Not at all  Social Connections: [place] (01/26/2022)   Social Connection and Isolation Panel [NHANES]    Frequency of Communication with Friends and Family: More than three times a week    Frequency of Social Gatherings with Friends and Family: More than three times a week    Attends Religious Services: More than 4 times per year    Active Member of [employer] or Organizations: Yes    Attends Archivist Meetings: Never    Marital Status: Married     Review of Systems  Constitutional:  Negative for appetite change, fever and unexpected weight change.  HENT:  Negative for congestion and sinus pressure.   Respiratory:  Negative for cough and chest tightness.        Breathing stable.   Cardiovascular:  Negative for chest pain, palpitations and leg swelling.  Gastrointestinal:  Negative for abdominal pain, diarrhea, nausea and vomiting.  Genitourinary:  Negative for difficulty urinating and dysuria.  Musculoskeletal:  Negative for joint swelling and myalgias.       Left hip discomfort as outlined.   Skin:  Negative for color change and rash.  Neurological:  Negative for dizziness, light-headedness and headaches.  Psychiatric/Behavioral:  Negative for  agitation and dysphoric mood.        Objective:     BP 138/88   Pulse 100   Temp 98.5 F (36.9 C) (Temporal)   Resp 18   Ht '5\' 7"'$  (1.702 m)   Wt 229 lb 4.8 oz (104 kg)  SpO2 98%   BMI 35.91 kg/m  Wt Readings from Last 3 Encounters:  03/20/22 229 lb 4.8 oz (104 kg)  02/25/22 231 lb (104.8 kg)  01/26/22 214 lb (97.1 kg)    Physical Exam Vitals reviewed.  Constitutional:      General: She is not in acute distress.    Appearance: Normal appearance.  HENT:     Head: Normocephalic and atraumatic.     Right Ear: External ear normal.     Left Ear: External ear normal.  Eyes:     General: No scleral icterus.       Right eye: No discharge.        Left eye: No discharge.     Conjunctiva/sclera: Conjunctivae normal.  Neck:     Thyroid: No thyromegaly.  Cardiovascular:     Rate and Rhythm: Normal rate and regular rhythm.  Pulmonary:     Effort: No respiratory distress.     Breath sounds: Normal breath sounds. No wheezing.  Abdominal:     General: Bowel sounds are normal.     Palpations: Abdomen is soft.     Tenderness: There is no abdominal tenderness.  Musculoskeletal:        General: No swelling or tenderness.     Cervical back: Neck supple. No tenderness.     Comments: Negative SLR.  Lymphadenopathy:     Cervical: No cervical adenopathy.  Skin:    Findings: No erythema or rash.  Neurological:     Mental Status: She is alert.  Psychiatric:        Mood and Affect: Mood normal.        Behavior: Behavior normal.      Outpatient Encounter Medications as of 03/20/2022  Medication Sig   albuterol (VENTOLIN HFA) 108 (90 Base) MCG/ACT inhaler Inhale 2 puffs into the lungs every 6 (six) hours as needed for wheezing or shortness of breath.   cephALEXin (KEFLEX) 500 MG capsule Take 1 capsule (500 mg total) by mouth 3 (three) times daily.   cholecalciferol (VITAMIN D3) 25 MCG (1000 UNIT) tablet Take 1,000 Units by mouth daily.   cyanocobalamin (VITAMIN B12) 1000 MCG/ML  injection INJECT 1 ML INTRAMUSCULARLY ONCE A WEEK FOR 3 WEEKS  THEN MONTHLY.   hydrochlorothiazide (MICROZIDE) 12.5 MG capsule Take 1 capsule (12.5 mg total) by mouth daily.   losartan (COZAAR) 100 MG tablet Take 1 tablet (100 mg total) by mouth at bedtime.   meloxicam (MOBIC) 15 MG tablet Take 15 mg by mouth daily. Take 1 tablet ('15mg'$ ) by mouth daily   rosuvastatin (CRESTOR) 10 MG tablet TAKE 1 TABLET BY MOUTH DAILY   Facility-Administered Encounter Medications as of 03/20/2022  Medication   0.9 %  sodium chloride infusion     Lab Results  Component Value Date   WBC 9.4 03/19/2022   HGB 13.8 03/19/2022   HCT 41.1 03/19/2022   PLT 283 03/19/2022   GLUCOSE 139 (H) 03/19/2022   CHOL 141 02/25/2022   TRIG 101.0 02/25/2022   HDL 50.70 02/25/2022   LDLDIRECT 82.0 02/25/2022   LDLCALC 70 02/25/2022   ALT 13 02/25/2022   AST 16 02/25/2022   NA 141 03/19/2022   K 3.4 (L) 03/19/2022   CL 107 03/19/2022   CREATININE 0.88 03/19/2022   BUN 11 03/19/2022   CO2 24 03/19/2022   TSH 1.78 02/25/2022   HGBA1C 5.4 02/25/2022   MICROALBUR 2.5 (H) 02/25/2022    DG Chest 2 View  Result Date: 03/20/2022 CLINICAL DATA:  Hypertension EXAM: CHEST - 2 VIEW COMPARISON:  None Available. FINDINGS: The heart size and mediastinal contours are within normal limits. Both lungs are clear. The visualized skeletal structures are unremarkable. IMPRESSION: No active cardiopulmonary disease. Electronically Signed   By: Fidela Salisbury M.D.   On: 03/20/2022 01:03   CT Head Wo Contrast  Result Date: 03/20/2022 CLINICAL DATA:  Headache, new or worsening (Age >= 50y) EXAM: CT HEAD WITHOUT CONTRAST TECHNIQUE: Contiguous axial images were obtained from the base of the skull through the vertex without intravenous contrast. RADIATION DOSE REDUCTION: This exam was performed according to the departmental dose-optimization program which includes automated exposure control, adjustment of the mA and/or kV according to patient  size and/or use of iterative reconstruction technique. COMPARISON:  None Available. FINDINGS: Brain: No acute intracranial abnormality. Specifically, no hemorrhage, hydrocephalus, mass lesion, acute infarction, or significant intracranial injury. Vascular: No hyperdense vessel or unexpected calcification. Skull: No acute calvarial abnormality. Sinuses/Orbits: No acute findings Other: None IMPRESSION: No acute intracranial abnormality. Electronically Signed   By: Rolm Baptise M.D.   On: 03/20/2022 00:30       Assessment & Plan:   Problem List Items Addressed This Visit     Essential hypertension    Elevated blood pressure.  Recent addition of hctz 12.'5mg'$  q day and increased losartan to '100mg'$  q day.  Had been doing well as outlined in her message.  Increased yesterday.  ER w/up as outlined.  Being treated for UTI.  Blood pressure on my check today improved 138/88.  Will continue current medications for now.  Hold on changing since just adjustments just made two weeks ago.  Monitor sodium.  Potassium slightly decreased.  Information given on foods with increased potassium.  Check potassium in 10-14 days.  Monitor pressure.  F/u with Dr Derrel Nip.       Family history of MI (myocardial infarction)    Family history of CAD.  Discussed further w/up and evaluation, for example calcium score, etc.  EKG reviewed.  Discussed cardiology evaluation.  She request.  D/w Dr Derrel Nip regarding referral.  Continue risk factor modification.       Hyperlipidemia LDL goal <100    Continues on crestor.   Lab Results  Component Value Date   CHOL 141 02/25/2022   HDL 50.70 02/25/2022   LDLCALC 70 02/25/2022   LDLDIRECT 82.0 02/25/2022   TRIG 101.0 02/25/2022   CHOLHDL 3 02/25/2022         Hypokalemia    Potassium 3.4.  information given on foods with increased potassium.  Recheck potassium in 10 - 14 days to confirm stable/normal since recently started hctz.        Relevant Orders   Potassium   Nocturia     Discussed nocturia, snoring and daytime fatigue.  Discussed possible sleep apnea.  Will notify Dr Derrel Nip if desires further testing.        Other Visit Diagnoses     Elevated heart rate with elevated blood pressure and diagnosis of hypertension    -  Primary   Relevant Orders   EKG 12-Lead (Completed)        Einar Pheasant, MD

## 2022-03-20 NOTE — Discharge Instructions (Signed)
Take antibiotic as prescribed (Keflex '500mg'$  3 times daily x7 days).  Continue to keep a log of your blood pressures.  Return to the ER for worsening symptoms, persistent vomiting, difficulty breathing or other concerns.

## 2022-03-20 NOTE — ED Notes (Signed)
Patient discharged at this time. Ambulated to lobby with independent and steady gait. Breathing unlabored speaking in full sentences. Verbalized understanding of all discharge, follow up, and medication teaching. Discharged homed with all belongings.   

## 2022-03-20 NOTE — Telephone Encounter (Signed)
FYI - pts blood pressure and heart rate continued to increase.  When I called to check on her, spoke to her husband Shanon Brow and she was in the ER.  Reviewed ER records.  Discharged to f/u here today.  She is on my schedule at 10:30 this am.

## 2022-03-20 NOTE — ED Notes (Signed)
Patient returned from radiology

## 2022-03-20 NOTE — ED Notes (Signed)
Patient transported to CT 

## 2022-03-20 NOTE — Telephone Encounter (Signed)
Saw her in the office today.  When I rechecked her pressure today - 138/88.  I left her medication the same for now.  I will get a full note to you.  She did request to see a cardiologist.  Wanted your input - as we discussed.

## 2022-03-20 NOTE — Telephone Encounter (Signed)
Pt is aware and gave a verbal understanding.  

## 2022-03-21 ENCOUNTER — Encounter: Payer: Self-pay | Admitting: Internal Medicine

## 2022-03-21 DIAGNOSIS — R351 Nocturia: Secondary | ICD-10-CM | POA: Insufficient documentation

## 2022-03-21 DIAGNOSIS — E876 Hypokalemia: Secondary | ICD-10-CM | POA: Insufficient documentation

## 2022-03-21 NOTE — Assessment & Plan Note (Signed)
Discussed nocturia, snoring and daytime fatigue.  Discussed possible sleep apnea.  Will notify Dr Derrel Nip if desires further testing.

## 2022-03-21 NOTE — Assessment & Plan Note (Signed)
Potassium 3.4.  information given on foods with increased potassium.  Recheck potassium in 10 - 14 days to confirm stable/normal since recently started hctz.

## 2022-03-21 NOTE — Assessment & Plan Note (Signed)
Family history of CAD.  Discussed further w/up and evaluation, for example calcium score, etc.  EKG reviewed.  Discussed cardiology evaluation.  She request.  D/w Dr Derrel Nip regarding referral.  Continue risk factor modification.

## 2022-03-21 NOTE — Telephone Encounter (Signed)
Was supposed to be scheduled for potassium check in 10 days.  Not scheduled at check out. Please schedule non fasting lab in 10 days.  Orders are in.

## 2022-03-21 NOTE — Assessment & Plan Note (Signed)
Elevated blood pressure.  Recent addition of hctz 12.'5mg'$  q day and increased losartan to '100mg'$  q day.  Had been doing well as outlined in her message.  Increased yesterday.  ER w/up as outlined.  Being treated for UTI.  Blood pressure on my check today improved 138/88.  Will continue current medications for now.  Hold on changing since just adjustments just made two weeks ago.  Monitor sodium.  Potassium slightly decreased.  Information given on foods with increased potassium.  Check potassium in 10-14 days.  Monitor pressure.  F/u with Dr Derrel Nip.

## 2022-03-21 NOTE — Assessment & Plan Note (Signed)
Continues on crestor.   Lab Results  Component Value Date   CHOL 141 02/25/2022   HDL 50.70 02/25/2022   LDLCALC 70 02/25/2022   LDLDIRECT 82.0 02/25/2022   TRIG 101.0 02/25/2022   CHOLHDL 3 02/25/2022

## 2022-03-22 LAB — URINE CULTURE: Culture: >=100000 — AB

## 2022-03-23 ENCOUNTER — Telehealth: Payer: Self-pay | Admitting: *Deleted

## 2022-03-23 NOTE — Telephone Encounter (Signed)
     Patient  visit on 03/20/2022  at [facility] was for hypertension   Have you been able to follow up with your primary care physician? Patient is feeling better and did see the PCP and will follow up in 1 month  The patient was able to obtain any needed medicine or equipment.  Are there diet recommendations that you are having difficulty following?  Patient expresses understanding of discharge instructions and education provided has no other needs at this time.    [facility] [phone] [address] , [place] [address] Email : [email or web address]

## 2022-03-24 NOTE — Telephone Encounter (Signed)
Patient has been called and lab appointment scheduled for tomorrow, 03/25/2022

## 2022-03-25 ENCOUNTER — Other Ambulatory Visit (INDEPENDENT_AMBULATORY_CARE_PROVIDER_SITE_OTHER): Payer: PPO

## 2022-03-25 DIAGNOSIS — E876 Hypokalemia: Secondary | ICD-10-CM

## 2022-03-25 LAB — POTASSIUM: Potassium: 3.8 mEq/L (ref 3.5–5.1)

## 2022-03-27 ENCOUNTER — Encounter: Payer: Self-pay | Admitting: Internal Medicine

## 2022-04-06 ENCOUNTER — Encounter: Payer: Self-pay | Admitting: Cardiology

## 2022-04-06 ENCOUNTER — Ambulatory Visit: Payer: PPO | Attending: Cardiology | Admitting: Cardiology

## 2022-04-06 VITALS — BP 140/74 | HR 76 | Ht 67.0 in | Wt 233.0 lb

## 2022-04-06 DIAGNOSIS — E6609 Other obesity due to excess calories: Secondary | ICD-10-CM

## 2022-04-06 DIAGNOSIS — Z6833 Body mass index (BMI) 33.0-33.9, adult: Secondary | ICD-10-CM | POA: Diagnosis not present

## 2022-04-06 DIAGNOSIS — Z8249 Family history of ischemic heart disease and other diseases of the circulatory system: Secondary | ICD-10-CM | POA: Diagnosis not present

## 2022-04-06 DIAGNOSIS — R0609 Other forms of dyspnea: Secondary | ICD-10-CM | POA: Diagnosis not present

## 2022-04-06 DIAGNOSIS — I10 Essential (primary) hypertension: Secondary | ICD-10-CM | POA: Diagnosis not present

## 2022-04-06 DIAGNOSIS — E785 Hyperlipidemia, unspecified: Secondary | ICD-10-CM | POA: Diagnosis not present

## 2022-04-06 DIAGNOSIS — E876 Hypokalemia: Secondary | ICD-10-CM

## 2022-04-06 DIAGNOSIS — E66811 Obesity, class 1: Secondary | ICD-10-CM

## 2022-04-06 NOTE — Progress Notes (Signed)
EKG ekg

## 2022-04-06 NOTE — Progress Notes (Signed)
Primary Care Provider: Crecencio Mc, MD [place] Cardiologist: None Electrophysiologist: None  Clinic Note: Chief Complaint  Patient presents with   New Patient (Initial Visit)    Exertional dyspnea    ===================================  ASSESSMENT/PLAN   Problem List Items Addressed This Visit       Cardiology Problems   Essential hypertension (Chronic)    Placed back on low-dose HCTZ along with increasing dose of losartan.  BP little elevated today But she is somewhat anxious.  Need to continue to follow-up.   Therapy would likely be dependent upon CPX assessment.      Relevant Orders   EKG 12-Lead (Completed)   Cardiac Stress Test: Informed Consent Details: Physician/Practitioner Attestation; Transcribe to consent form and obtain patient signature   Hyperlipidemia LDL goal <100 (Chronic)    With significant family history of CAD, metastatic goal of less than 100.  If there is concern for CAD infection he is already at the next goal of 70 on current low-dose of statin.  We will simply continue current dose.  Further adjustment based on evaluation.      Relevant Orders   EKG 12-Lead (Completed)   Cardiac Stress Test: Informed Consent Details: Physician/Practitioner Attestation; Transcribe to consent form and obtain patient signature     Other   Family history of MI (myocardial infarction) (Chronic)    She does have history of premature heart disease in her family with her father having MI at age 61-38.  This is probably more related to his kidney disease.  Progressive CAD, still warrants baseline evaluation. As her risk factors she does not have diabetes-A1c was 5.4.  She is on low-dose rosuvastatin with excellent lipid control-LDL of 70. She hypertension which per her home recordings have been better controlled.  She is little anxious today, besides the ER visit with high blood pressure she has been pretty well controlled on her current meds.  With  her having symptoms of exertional dyspnea I would like to exclude potential ischemic heart etiology with a Stress Test--Cardiopulmonary Exercise Stress Test (CPX).  Delineate between cardiac versus pulmonary versus deconditioning related dyspnea.  Plan: CPX Based on CPX results, would potentially consider further evaluation with either coronary CTA if abnormal myocardial standpoint, versus Coronary Calcium Score if normal for cardiac standpoint. If there is concern for exertional dyspnea, cardiac standpoint would also consider 2D echo since she has history of hypertension.      Relevant Orders   EKG 12-Lead (Completed)   Cardiac Stress Test: Informed Consent Details: Physician/Practitioner Attestation; Transcribe to consent form and obtain patient signature   Cardiopulmonary exercise test   Obesity (Chronic)    Somewhat obese and deconditioned. Since she is interested in increasing her exercise levels, she is wanting to make sure that she is safe to exercise.  As such we will evaluate with a CPX.      DOE (dyspnea on exertion) - Primary    Need to delineate between deconditioning versus cardiac or pulmonary etiology.  She is quite deconditioned and does not exercise much.  She has significant family history of CAD but also has had issues with wheezing and bronchitis.  Plan: Evaluate with Cardiopulmonary Exercise Test (CPX) Pending results would consider additional studies such as echocardiogram or coronary CTA if evidence of cardiac abnormality, if not would order Coronary Calcium Score)        Relevant Orders   EKG 12-Lead (Completed)   Cardiac Stress Test: Informed Consent Details: Physician/Practitioner Attestation; Transcribe to  consent form and obtain patient signature   Cardiopulmonary exercise test   Hypokalemia (Chronic)    Potentially related to HCTZ despite being on high-dose ARB.  Would not titrate HCTZ dose further if additional BP control is required.       ===================================  HPI:    Jo White is an obese 71 y.o. female (with distant smoking history and a PMH notable for labile HTN, HLD and reactive airway disease) who presents today for evaluation of Exertional Dyspnea and Hypertension with Family History of CAD. She was referred at the request of Crecencio Mc, MD/Charlene Nicki Reaper, MD.  Recent Hospitalizations:  03/19/2022 ==> BP up to 196/114 with HR 120s.  Apparently had missed 2 days of BP medications previously having blood pressure ranges in the 120s when seen by PCP.  Mild headache and dizziness.  Had recent cold with cough and congestion.  Negative COVID test.  No fever or chills. Found to have UTI on UA.  Normal chest x-ray and normal CT head Discharged on antibiotics.  Jo White was seen the following day by Dr. Einar Pheasant as a work-in patient post ER visit.  Her note indicated that losartan dose had recently been increased to 100 mg daily with HCTZ 12.5 mg PRN.  Note indicates that [name] was checking her blood pressure and had a blood pressure reading 160/107 with elevated heart rate.  She went to the ER due to the persistence of hypertension and was found to be hypertensive in the 190s.  Normal EKG and troponins.  Potassium mildly reduced at 3.4.  Noted that she was discharged on antibiotics.  With her blood pressure being elevated, she denied any significant symptoms of headache or dizziness or chest pain, dyspnea.  She noted stable breathing with stable exertional dyspnea. => Plan was to follow-up potassium level.  Also recommend discussing potential referral to cardiology for elevated baseline evaluation based on family history of CAD.  Reviewed  CV studies:    The following studies were reviewed today: (if available, images/films reviewed: From Epic Chart or Care Everywhere) N/A:  Interval History:   Jo White presents here today for cardiology evaluation based on her family history and symptoms of  exertional dyspnea.  She has somewhat labile and blood pressure bit is usually better controlled than it is today.  She tells me that she has exertional dyspnea and easy fatigue.  Has not had any associated chest pain or pressure.  He denies any PND, orthopnea with trivial edema.  She says occasionally she feels her heart rate going up but usually associated with her being anxious.  She has occasionally had some upper arm pain that radiates up to her neck and back, has not any chest pain or pressure with rest or exertion.  She does note being quite deconditioned, and is trying to increase level of exercise.  She is concerned with her family history and exertional dyspnea that she could potentially have significant heart disease.  CV Review of Symptoms (Summary): Cardiovascular ROS: positive for - dyspnea on exertion and rapid heart rate negative for - chest pain, edema, irregular heartbeat, orthopnea, palpitations, paroxysmal nocturnal dyspnea, shortness of breath, or lightheadedness, dizziness or wooziness, syncope/near syncope or TIA/amaurosis fugax, claudication  REVIEWED OF SYSTEMS   Review of Systems  Constitutional:  Negative for malaise/fatigue (Somewhat deconditioned.) and weight loss.  HENT:  Negative for congestion and sinus pain.   Respiratory:  Positive for shortness of breath (Only with exertion) and wheezing (Not  recently, but does occasionally have "asthma" symptoms). Negative for cough.   Cardiovascular:  Negative for leg swelling.  Gastrointestinal:  Negative for abdominal pain, blood in stool and melena.  Genitourinary:  Negative for dysuria and hematuria.  Musculoskeletal:  Positive for joint pain. Negative for back pain, falls and myalgias.  Neurological:  Negative for dizziness, focal weakness and weakness.  Psychiatric/Behavioral:  Negative for depression and memory loss. The patient is not nervous/anxious and does not have insomnia.    I have reviewed and (if needed)  personally updated the patient's problem list, medications, allergies, past medical and surgical history, social and family history.   PAST MEDICAL HISTORY   Past Medical History:  Diagnosis Date   Anemia    Basal cell carcinoma 12/19/2019   R ant lat thigh   Blood in stool    Cancer ([place])    bladder   Chronic heel pain 42/70/6237   Complication of anesthesia    External hemorrhoids without mention of complication    Globus sensation 01/23/2020   Hypertension 01/2020   Internal hemorrhoids with other complication    Otalgia, unspecified    Pain in joint, shoulder region 08/19/2014   Personal history of unspecified circulatory disease    PONV (postoperative nausea and vomiting)    Squamous cell carcinoma of skin 12/06/2017   L ant thigh    Squamous cell carcinoma of skin 12/06/2017   R mid lat pretibial   Swelling, mass, or lump in head and neck    UTI (urinary tract infection) 07/2015    PAST SURGICAL HISTORY   Past Surgical History:  Procedure Laterality Date   APPENDECTOMY     BREAST BIOPSY     right   COLONOSCOPY     CYSTOSCOPY     DIAGNOSTIC LAPAROSCOPY     MULITPLE FOR IN VITRO   fallopian tube removal     r tube   HEMORRHOID SURGERY  11/2006   in vitro fertilization  [place] TUMOR N/A 08/06/2015   Procedure: TRANSURETHRAL RESECTION OF BLADDER TUMOR (TURBT);  Surgeon: Royston Cowper, MD;  Location: [facility] ORS;  Service: Urology;  Laterality: N/A;    Immunization History  Administered Date(s) Administered   Fluad Quad(high Dose 65+) 01/18/2019   Influenza Split 02/23/2013, 02/15/2014   Influenza Whole 01/17/2008   Influenza, High Dose Seasonal PF 03/15/2018, 04/08/2020   Influenza-Unspecified 02/15/2014, 03/21/2016, 02/22/2017, 02/11/2022   PFIZER(Purple Top)SARS-COV-2 Vaccination 06/28/2019, 07/19/2019, 07/08/2020   Pneumococcal Conjugate-13 01/23/2020   Td 07/02/2008   Tdap 11/23/2011    MEDICATIONS/ALLERGIES    Current Meds  Medication Sig   albuterol (VENTOLIN HFA) 108 (90 Base) MCG/ACT inhaler -> Has not had to use lately Inhale 2 puffs into the lungs every 6 (six) hours as needed for wheezing or shortness of breath.   cholecalciferol (VITAMIN D3) 25 MCG (1000 UNIT) tablet Not taking Take 1,000 Units by mouth daily.   cyanocobalamin (VITAMIN B12) 1000 MCG/ML injection Not taking INJECT 1 ML INTRAMUSCULARLY ONCE A WEEK FOR 3 WEEKS  THEN MONTHLY.   hydrochlorothiazide (MICROZIDE) 12.5 MG capsule Take 1 capsule (12.5 mg total) by mouth daily.   losartan (COZAAR) 100 MG tablet Take 1 tablet (100 mg total) by mouth at bedtime.   meloxicam (MOBIC) 15 MG tablet Take 15 mg by mouth daily. Take 1 tablet ('15mg'$ ) by mouth daily   rosuvastatin (CRESTOR) 10 MG tablet TAKE 1 TABLET BY MOUTH DAILY   [Completed] cephALEXin (KEFLEX) 500 MG capsule  Take 1 capsule (500 mg total) by mouth 3 (three) times daily.    No Known Allergies  SOCIAL HISTORY/FAMILY HISTORY   Reviewed in Epic:  Pertinent findings:  Social History   Tobacco Use   Smoking status: Former    Packs/day: 1.00    Years: 1.50    Total pack years: 1.50    Types: Cigarettes    Quit date: 01/11/1972    Years since quitting: 50.2   Smokeless tobacco: Never   Tobacco comments:    Quit over 48 years ago  Vaping Use   Vaping Use: Never used  Substance Use Topics   Alcohol use: Not Currently    Comment: rare alcohol intake   Drug use: No   Social History   Social History Narrative   No regular exercise, some walking   Diet- some fruit and veggies, avoid fast food   Family History  Problem Relation Age of Onset   Hypertension Mother    Bradycardia Mother    Heart failure Mother 30   Colon polyps Mother    Diverticulosis Mother    Heart disease Mother    Heart disease Father 33       secodnary to Bright's disease   Heart attack Father 11   Glomerulonephritis Father        Bright disease   Diabetes Sister    Hyperlipidemia  Sister    Cancer Sister        breast   Breast cancer Sister    Hypertension Sister    Diabetes Sister    Skin cancer Sister    Hypertension Sister    Hypertension Brother    Diabetes Paternal Grandmother    Colon cancer Neg Hx     OBJCTIVE -PE, EKG, labs   Wt Readings from Last 3 Encounters:  04/06/22 233 lb (105.7 kg)  03/20/22 229 lb 4.8 oz (104 kg)  02/25/22 231 lb (104.8 kg)    Physical Exam: BP (!) 140/74   Pulse 76   Ht '5\' 7"'$  (1.702 m)   Wt 233 lb (105.7 kg)   SpO2 98%   BMI 36.49 kg/m  Physical Exam Vitals reviewed.  Constitutional:      General: She is not in acute distress.    Appearance: Normal appearance. She is not ill-appearing or toxic-appearing.  HENT:     Head: Normocephalic and atraumatic.  Neck:     Vascular: No carotid bruit.  Cardiovascular:     Rate and Rhythm: Normal rate and regular rhythm.     Pulses: Normal pulses.     Heart sounds: Normal heart sounds. No murmur heard.    No friction rub. No gallop.  Pulmonary:     Effort: Pulmonary effort is normal. No respiratory distress.     Breath sounds: Normal breath sounds. No wheezing, rhonchi or rales.  Chest:     Chest wall: No tenderness.  Abdominal:     General: Abdomen is flat. Bowel sounds are normal. There is no distension.     Palpations: Abdomen is soft. There is no mass (No HSM or bruit).     Tenderness: There is no abdominal tenderness. There is no guarding or rebound.  Musculoskeletal:        General: No swelling. Normal range of motion.     Cervical back: Normal range of motion and neck supple.  Skin:    General: Skin is warm and dry.  Neurological:     General: No focal deficit present.  Mental Status: She is alert and oriented to person, place, and time.  Psychiatric:        Mood and Affect: Mood normal.        Behavior: Behavior normal.        Thought Content: Thought content normal.        Judgment: Judgment normal.     Adult ECG Report  Rate: 76;  Rhythm:  normal sinus rhythm and nonspecific ST and T wave changes.  Low voltage. ;  Normal axis, intervals and durations.  Narrative Interpretation: Borderline  Recent Labs: Reviewed Lab Results  Component Value Date   CHOL 141 02/25/2022   HDL 50.70 02/25/2022   LDLCALC 70 02/25/2022   LDLDIRECT 82.0 02/25/2022   TRIG 101.0 02/25/2022   CHOLHDL 3 02/25/2022   Lab Results  Component Value Date   CREATININE 0.88 03/19/2022   BUN 11 03/19/2022   NA 141 03/19/2022   K 3.8 03/25/2022   CL 107 03/19/2022   CO2 24 03/19/2022      Latest Ref Rng & Units 03/19/2022    5:57 PM 02/25/2022    2:10 PM 02/13/2021   10:07 AM  CBC  WBC 4.0 - 10.5 K/uL 9.4  5.2  5.0   Hemoglobin 12.0 - 15.0 g/dL 13.8  12.6  12.3   Hematocrit 36.0 - 46.0 % 41.1  37.7  37.4   Platelets 150 - 400 K/uL 283  194.0  190.0     Lab Results  Component Value Date   HGBA1C 5.4 02/25/2022   Lab Results  Component Value Date   TSH 1.78 02/25/2022    ================================================== I spent a total of 27 minutes with the patient spent in direct patient consultation.  Additional time spent with chart review  / charting (studies, outside notes, etc): 25 min Total Time: 52 min  Current medicines are reviewed at length with the patient today.  (+/- concerns) none  Notice: This dictation was prepared with Dragon dictation along with smart phrase technology. Any transcriptional errors that result from this process are unintentional and may not be corrected upon review.  Studies Ordered:   Orders Placed This Encounter  Procedures   Cardiopulmonary exercise test   Cardiac Stress Test: Informed Consent Details: Physician/Practitioner Attestation; Transcribe to consent form and obtain patient signature   EKG 12-Lead   No orders of the defined types were placed in this encounter.   Patient Instructions / Medication Changes & Studies & Tests Ordered   Shared Decision Making/Informed Consent The risks  [chest pain, shortness of breath, cardiac arrhythmias, dizziness, blood pressure fluctuations, myocardial infarction, stroke/transient ischemic attack, and life-threatening complications (estimated to be 1 in 10,000)], benefits (risk stratification, diagnosing coronary artery disease, treatment guidance) and alternatives of an exercise tolerance test were discussed in detail with Jo White and she agrees to proceed.   Patient Instructions  Medication Instructions:   No changes *If you need a refill on your cardiac medications before your next appointment, please call your pharmacy*   Lab Work:  Not needed   Testing/Procedures: Will be schedule at  [facility] Your physician has recommended that you have a cardiopulmonary stress test (CPX). CPX testing is a non-invasive measurement of heart and lung function. It replaces a traditional treadmill stress test. This type of test provides a tremendous amount of information that relates not only to your present condition but also for future outcomes. This test combines measurements of you ventilation, respiratory gas  exchange in the lungs, electrocardiogram (EKG), blood pressure and physical response before, during, and following an exercise protocol.    Follow-Up: At [facility], you and your health needs are our priority.  As part of our continuing mission to provide you with exceptional heart care, we have created designated Provider Care Teams.  These Care Teams include your primary Cardiologist (physician) and Advanced Practice Providers (APPs -  Physician Assistants and Nurse Practitioners) who all work together to provide you with the care you need, when you need it.     Your next appointment:   2 to 3  month(s)  ( [place])   The format for your next appointment:   In Person  Provider:   Glenetta Hew, MD   Other Instructions       Leonie Man, MD, MS Glenetta Hew, M.D.,  M.S. Interventional Cardiologist  [place]  Pager # [phone] Phone # [phone] [address] [place], [place] [address]   Thank you for choosing [facility] [place]!!

## 2022-04-06 NOTE — Patient Instructions (Addendum)
Medication Instructions:   No changes *If you need a refill on your cardiac medications before your next appointment, please call your pharmacy*   Lab Work:  Not needed   Testing/Procedures: Will be schedule at  [facility] Your physician has recommended that you have a cardiopulmonary stress test (CPX). CPX testing is a non-invasive measurement of heart and lung function. It replaces a traditional treadmill stress test. This type of test provides a tremendous amount of information that relates not only to your present condition but also for future outcomes. This test combines measurements of you ventilation, respiratory gas exchange in the lungs, electrocardiogram (EKG), blood pressure and physical response before, during, and following an exercise protocol.    Follow-Up: At [facility], you and your health needs are our priority.  As part of our continuing mission to provide you with exceptional heart care, we have created designated Provider Care Teams.  These Care Teams include your primary Cardiologist (physician) and Advanced Practice Providers (APPs -  Physician Assistants and Nurse Practitioners) who all work together to provide you with the care you need, when you need it.     Your next appointment:   2 to 3  month(s)  ( [place])   The format for your next appointment:   In Person  Provider:   Glenetta Hew, MD   Other Instructions

## 2022-04-12 ENCOUNTER — Encounter: Payer: Self-pay | Admitting: Cardiology

## 2022-04-12 NOTE — Assessment & Plan Note (Signed)
Need to delineate between deconditioning versus cardiac or pulmonary etiology.  She is quite deconditioned and does not exercise much.  She has significant family history of CAD but also has had issues with wheezing and bronchitis.  Plan: Evaluate with Cardiopulmonary Exercise Test (CPX) Pending results would consider additional studies such as echocardiogram or coronary CTA if evidence of cardiac abnormality, if not would order Coronary Calcium Score)

## 2022-04-12 NOTE — Assessment & Plan Note (Signed)
Somewhat obese and deconditioned. Since she is interested in increasing her exercise levels, she is wanting to make sure that she is safe to exercise.  As such we will evaluate with a CPX.

## 2022-04-12 NOTE — Assessment & Plan Note (Signed)
With significant family history of CAD, metastatic goal of less than 100.  If there is concern for CAD infection he is already at the next goal of 70 on current low-dose of statin.  We will simply continue current dose.  Further adjustment based on evaluation.

## 2022-04-12 NOTE — Assessment & Plan Note (Signed)
Potentially related to HCTZ despite being on high-dose ARB.  Would not titrate HCTZ dose further if additional BP control is required.

## 2022-04-12 NOTE — Assessment & Plan Note (Signed)
Placed back on low-dose HCTZ along with increasing dose of losartan.  BP little elevated today But she is somewhat anxious.  Need to continue to follow-up.   Therapy would likely be dependent upon CPX assessment.

## 2022-04-12 NOTE — Assessment & Plan Note (Signed)
She does have history of premature heart disease in her family with her father having MI at age 70-38.  This is probably more related to his kidney disease.  Progressive CAD, still warrants baseline evaluation. As her risk factors she does not have diabetes-A1c was 5.4.  She is on low-dose rosuvastatin with excellent lipid control-LDL of 70. She hypertension which per her home recordings have been better controlled.  She is little anxious today, besides the ER visit with high blood pressure she has been pretty well controlled on her current meds.  With her having symptoms of exertional dyspnea I would like to exclude potential ischemic heart etiology with a Stress Test--Cardiopulmonary Exercise Stress Test (CPX).  Delineate between cardiac versus pulmonary versus deconditioning related dyspnea.  Plan: CPX Based on CPX results, would potentially consider further evaluation with either coronary CTA if abnormal myocardial standpoint, versus Coronary Calcium Score if normal for cardiac standpoint. If there is concern for exertional dyspnea, cardiac standpoint would also consider 2D echo since she has history of hypertension.

## 2022-04-21 ENCOUNTER — Encounter: Payer: Self-pay | Admitting: Internal Medicine

## 2022-04-21 ENCOUNTER — Ambulatory Visit (INDEPENDENT_AMBULATORY_CARE_PROVIDER_SITE_OTHER): Payer: PPO | Admitting: Internal Medicine

## 2022-04-21 VITALS — BP 120/76 | HR 72 | Temp 97.8°F | Ht 67.0 in | Wt 235.6 lb

## 2022-04-21 DIAGNOSIS — I10 Essential (primary) hypertension: Secondary | ICD-10-CM

## 2022-04-21 DIAGNOSIS — R4589 Other symptoms and signs involving emotional state: Secondary | ICD-10-CM | POA: Insufficient documentation

## 2022-04-21 DIAGNOSIS — F418 Other specified anxiety disorders: Secondary | ICD-10-CM | POA: Diagnosis not present

## 2022-04-21 DIAGNOSIS — E6609 Other obesity due to excess calories: Secondary | ICD-10-CM | POA: Diagnosis not present

## 2022-04-21 DIAGNOSIS — Z6833 Body mass index (BMI) 33.0-33.9, adult: Secondary | ICD-10-CM | POA: Diagnosis not present

## 2022-04-21 MED ORDER — ALPRAZOLAM 0.25 MG PO TABS: 0.2500 mg | ORAL_TABLET | Freq: Two times a day (BID) | ORAL | 0 refills | Status: AC | PRN

## 2022-04-21 NOTE — Assessment & Plan Note (Signed)
Encouraged her to start a walking program (until cleared by cardiology more more intensive exercise).

## 2022-04-21 NOTE — Patient Instructions (Addendum)
Continue weekly checks of blood pressure and notify me if readings are starting to stay over 130/80  Your potassium level can occasionally drop from hctz,  but you do not need a supplement you can increase your intake of potassium rich foods:  Apples,  Artichokes,  avocadoes Bananas,  Beets, brussel sprouts, chard,  Dates,  Collard greens,  canteloupe and nectarines   I am prescribing a mild anxiolytic to use the next time you feel yourself getting anxious due to BP or other issues.  Alprazolam is generic for Xanax AND is effective for treating panic attacks.  It works in about 15 minutes

## 2022-04-21 NOTE — Progress Notes (Signed)
Subjective:  Patient ID: Jo White, female    DOB: 1950/11/09  Age: 71 y.o. MRN: [id number]  CC: The primary encounter diagnosis was Class 1 obesity due to excess calories without serious comorbidity with body mass index (BMI) of 33.0 to 33.9 in adult. Diagnoses of Anxiety about health and Essential hypertension were also pertinent to this visit. Follow up on hypertension   HPI Jo White presents for follow up on hypertension , hyperlipidemia,  recent hospitalization  Chief Complaint  Patient presents with   Follow-up   HTN:  she was treated in ER on NOV  2 for HTNsive urgency  Workup for AMI,  CVA was negative,  UTI  was found with UA/micro and culture (asymptomatic)  but she was treated with Septra.  She was  seen for ER follow up  on NOv 3 by Dr Nicki Reaper, no changes made as BP was < 140/90.  Diet reviewed, use of NSAIDs? Negative.  No elevations since then,  home readings on a calibrated machine have been < 130/80 consistently on regimen of losartan 100 mg and hctz 12.5 mg .  She recalls that the elevated reading at home caused her to feel anxious,  and the more often she checked it  the higher her BP and anxiety became.   Since then she has met with a cardiologist and  noninvasive risk stratification is planned for early February    Outpatient Medications Prior to Visit  Medication Sig Dispense Refill   albuterol (VENTOLIN HFA) 108 (90 Base) MCG/ACT inhaler Inhale 2 puffs into the lungs every 6 (six) hours as needed for wheezing or shortness of breath. 3.7 g 11   hydrochlorothiazide (MICROZIDE) 12.5 MG capsule Take 1 capsule (12.5 mg total) by mouth daily. 90 capsule 1   losartan (COZAAR) 100 MG tablet Take 1 tablet (100 mg total) by mouth at bedtime. 90 tablet 1   meloxicam (MOBIC) 15 MG tablet Take 15 mg by mouth daily. Take 1 tablet (79m) by mouth daily     rosuvastatin (CRESTOR) 10 MG tablet TAKE 1 TABLET BY MOUTH DAILY 90 tablet 0   cholecalciferol (VITAMIN D3) 25 MCG (1000  UNIT) tablet Take 1,000 Units by mouth daily. (Patient not taking: Reported on 04/21/2022)     cyanocobalamin (VITAMIN B12) 1000 MCG/ML injection INJECT 1 ML INTRAMUSCULARLY ONCE A WEEK FOR 3 WEEKS  THEN MONTHLY. (Patient not taking: Reported on 04/21/2022) 10 mL 3   Facility-Administered Medications Prior to Visit  Medication Dose Route Frequency Provider Last Rate Last Admin   0.9 %  sodium chloride infusion  500 mL Intravenous Continuous JMilus Banister MD        Review of Systems;  Patient denies headache, fevers, malaise, unintentional weight loss, skin rash, eye pain, sinus congestion and sinus pain, sore throat, dysphagia,  hemoptysis , cough, dyspnea, wheezing, chest pain, palpitations, orthopnea, edema, abdominal pain, nausea, melena, diarrhea, constipation, flank pain, dysuria, hematuria, urinary  Frequency, nocturia, numbness, tingling, seizures,  Focal weakness, Loss of consciousness,  Tremor, insomnia, depression, anxiety, and suicidal ideation.      Objective:  BP 120/76   Pulse 72   Temp 97.8 F (36.6 C) (Oral)   Ht 5' 7" (1.702 m)   Wt 235 lb 9.6 oz (106.9 kg)   SpO2 98%   BMI 36.90 kg/m   BP Readings from Last 3 Encounters:  04/21/22 120/76  04/06/22 (!) 140/74  03/20/22 138/88    Wt Readings from Last 3 Encounters:  04/21/22 235 lb 9.6 oz (106.9 kg)  04/06/22 233 lb (105.7 kg)  03/20/22 229 lb 4.8 oz (104 kg)    General appearance: alert, cooperative and appears stated age Ears: normal TM's and external ear canals both ears Throat: lips, mucosa, and tongue normal; teeth and gums normal Neck: no adenopathy, no carotid bruit, supple, symmetrical, trachea midline and thyroid not enlarged, symmetric, no tenderness/mass/nodules Back: symmetric, no curvature. ROM normal. No CVA tenderness. Lungs: clear to auscultation bilaterally Heart: regular rate and rhythm, S1, S2 normal, no murmur, click, rub or gallop Abdomen: soft, non-tender; bowel sounds normal; no  masses,  no organomegaly Pulses: 2+ and symmetric Skin: Skin color, texture, turgor normal. No rashes or lesions Lymph nodes: Cervical, supraclavicular, and axillary nodes normal. Neuro:  awake and interactive with normal mood and affect. Higher cortical functions are normal. Speech is clear without word-finding difficulty or dysarthria. Extraocular movements are intact. Visual fields of both eyes are grossly intact. Sensation to light touch is grossly intact bilaterally of upper and lower extremities. Motor examination shows 4+/5 symmetric hand grip and upper extremity and 5/5 lower extremity strength. There is no pronation or drift. Gait is non-ataxic   Lab Results  Component Value Date   HGBA1C 5.4 02/25/2022   HGBA1C 5.4 06/13/2020   HGBA1C 5.3 01/16/2019    Lab Results  Component Value Date   CREATININE 0.88 03/19/2022   CREATININE 0.88 02/25/2022   CREATININE 1.04 02/13/2021    Lab Results  Component Value Date   WBC 9.4 03/19/2022   HGB 13.8 03/19/2022   HCT 41.1 03/19/2022   PLT 283 03/19/2022   GLUCOSE 139 (H) 03/19/2022   CHOL 141 02/25/2022   TRIG 101.0 02/25/2022   HDL 50.70 02/25/2022   LDLDIRECT 82.0 02/25/2022   LDLCALC 70 02/25/2022   ALT 13 02/25/2022   AST 16 02/25/2022   NA 141 03/19/2022   K 3.8 03/25/2022   CL 107 03/19/2022   CREATININE 0.88 03/19/2022   BUN 11 03/19/2022   CO2 24 03/19/2022   TSH 1.78 02/25/2022   HGBA1C 5.4 02/25/2022   MICROALBUR 2.5 (H) 02/25/2022    DG Chest 2 View  Result Date: 03/20/2022 CLINICAL DATA:  Hypertension EXAM: CHEST - 2 VIEW COMPARISON:  None Available. FINDINGS: The heart size and mediastinal contours are within normal limits. Both lungs are clear. The visualized skeletal structures are unremarkable. IMPRESSION: No active cardiopulmonary disease. Electronically Signed   By: Fidela Salisbury M.D.   On: 03/20/2022 01:03   CT Head Wo Contrast  Result Date: 03/20/2022 CLINICAL DATA:  Headache, new or worsening  (Age >= 50y) EXAM: CT HEAD WITHOUT CONTRAST TECHNIQUE: Contiguous axial images were obtained from the base of the skull through the vertex without intravenous contrast. RADIATION DOSE REDUCTION: This exam was performed according to the departmental dose-optimization program which includes automated exposure control, adjustment of the mA and/or kV according to patient size and/or use of iterative reconstruction technique. COMPARISON:  None Available. FINDINGS: Brain: No acute intracranial abnormality. Specifically, no hemorrhage, hydrocephalus, mass lesion, acute infarction, or significant intracranial injury. Vascular: No hyperdense vessel or unexpected calcification. Skull: No acute calvarial abnormality. Sinuses/Orbits: No acute findings Other: None IMPRESSION: No acute intracranial abnormality. Electronically Signed   By: Rolm Baptise M.D.   On: 03/20/2022 00:30    Assessment & Plan:   Problem List Items Addressed This Visit     Anxiety about health    Her recent episode of hypertensive urgency appears to have been  triggered by anxiety.  Rx alprazolam for prn use to avoid another ER visit       Relevant Medications   ALPRAZolam (XANAX) 0.25 MG tablet   Essential hypertension (Chronic)    Continue  low-dose HCTZ along with 100 mg of losartan.  BP little elevated today But she is somewhat anxious.  Adding prn alprazolam       Obesity - Primary (Chronic)    Encouraged her to start a walking program (until cleared by cardiology more more intensive exercise).        I spent a total of 30 minutes with this patient in a face to face visit on the date of this encounter reviewing the last office visit with me, patient's diet and exercise habits, home blood pressure  readings, recent ER visit including labs and imaging studies ,   and post visit ordering of testing and therapeutics.    Follow-up: No follow-ups on file.   Crecencio Mc, MD

## 2022-04-21 NOTE — Assessment & Plan Note (Signed)
Continue  low-dose HCTZ along with 100 mg of losartan.  BP little elevated today But she is somewhat anxious.  Adding prn alprazolam

## 2022-04-21 NOTE — Assessment & Plan Note (Signed)
Her recent episode of hypertensive urgency appears to have been triggered by anxiety.  Rx alprazolam for prn use to avoid another ER visit

## 2022-04-29 NOTE — Telephone Encounter (Signed)
MyChart messgae sent to patient. 

## 2022-05-20 ENCOUNTER — Other Ambulatory Visit: Payer: Self-pay | Admitting: Internal Medicine

## 2022-06-22 ENCOUNTER — Other Ambulatory Visit: Payer: Self-pay | Admitting: Internal Medicine

## 2022-06-22 ENCOUNTER — Ambulatory Visit (HOSPITAL_COMMUNITY): Payer: PPO | Attending: Cardiology

## 2022-06-22 ENCOUNTER — Other Ambulatory Visit (HOSPITAL_COMMUNITY): Payer: Self-pay | Admitting: *Deleted

## 2022-06-22 DIAGNOSIS — R0609 Other forms of dyspnea: Secondary | ICD-10-CM | POA: Diagnosis not present

## 2022-06-22 DIAGNOSIS — R0600 Dyspnea, unspecified: Secondary | ICD-10-CM | POA: Diagnosis not present

## 2022-06-22 DIAGNOSIS — Z8249 Family history of ischemic heart disease and other diseases of the circulatory system: Secondary | ICD-10-CM

## 2022-06-26 ENCOUNTER — Other Ambulatory Visit: Payer: Self-pay | Admitting: Internal Medicine

## 2022-06-29 ENCOUNTER — Ambulatory Visit: Payer: PPO | Admitting: Dermatology

## 2022-06-29 ENCOUNTER — Other Ambulatory Visit: Payer: Self-pay

## 2022-06-29 MED ORDER — VITAMIN D 25 MCG (1000 UNIT) PO TABS
1000.0000 [IU] | ORAL_TABLET | Freq: Every day | ORAL | 1 refills | Status: AC
  Filled 2022-06-29: qty 90, 90d supply, fill #0

## 2022-06-30 ENCOUNTER — Other Ambulatory Visit: Payer: Self-pay

## 2022-07-01 NOTE — Progress Notes (Signed)
Primary Care Provider: Crecencio Mc, [place] Cardiologist: None Electrophysiologist: None  Clinic Note: Chief Complaint  Patient presents with   Follow-up    Discussing test results.  Doing better with walking.    ===================================  ASSESSMENT/PLAN   Problem List Items Addressed This Visit       Cardiology Problems   Hyperlipidemia LDL goal <100 (Chronic)    LDL with most recent lipids being significant 70-80. Extensive rosuvastatin 10 mg daily.  Tolerating current dose      Essential hypertension (Chronic)    Blood pressure looks better today.  Continue current dose of losartan with HCTZ.        Other   Obesity (Chronic)    Based on results of the CPX, she is fine to get back to a full exercise program.      Family history of MI (myocardial infarction) (Chronic)   DOE (dyspnea on exertion) - Primary    Reassuring findings on CPX testing that limit his weight recommendation is continued exercise and dietary changes and weight loss.  Recommended starting with 10 minutes interval and building up to 30 to 45 minutes daily walking. Additional exercises would also be helpful.  She already notes feeling better after starting exercise program in the last month.      ===================================  HPI:    Jo White is an obese 72 y.o. female (with distant smoking history and a PMH notable for labile HTN, HLD and reactive airway disease) who presents today for f/u evaluation of Exertional Dyspnea and Hypertension with Family History of CAD.  She follows up today at the request of Crecencio Mc, MD/Charlene Nicki Reaper, MD.  Recent Hospitalizations:  03/19/2022 ==> BP up to 196/114 with HR 120s.  Apparently had missed 2 days of BP medications previously having blood pressure ranges in the 120s when seen by PCP.  Mild headache and dizziness.  Had recent cold with cough and congestion.  Negative COVID test.  No fever or  chills. Found to have UTI on UA.  Normal chest x-ray and normal CT head Discharged on antibiotics.  Jo White was seen for initial consultation on 04/06/2024, following the ER visit on 03/19/2022 and clinic visit follow with Dr. Einar Pheasant on 03/20/2022.  Her dose of losartan had been increased to 100 mg daily along with PRN HCTZ 12.5 mg daily.  Blood pressures were elevated usually in the 160/105 mmHg range.  Also noted higher heart rate than usual.  She went to the emergency room for persistent hypertension with blood pressure in the 190s as reviewed.  Had stable exertional dyspnea, and exercise intolerance/easy fatigue but no chest pain or pressure.  She was referred because of elevated blood pressure and family history of CAD. Ordered CPX   She was seen by her PCP in follow-up on December 5 for follow-up of BP evaluation.  Discussed concerns that she was getting anxious about having some higher blood pressure levels.  Was actually given prescription for Xanax, encouraged walking program until cleared by cardiology.  Reviewed  CV studies:    The following studies were reviewed today: (if available, images/films reviewed: From Epic Chart or Care Everywhere) CPX-preliminary report (06/22/2022) [employer] PFTs: FVC 2.87 (90%), FEV1 2.27 (93%), FEV1/FVC 79 (101%)).  MVV 110 (119%) => normal range. Exercised for 8:15 minutes.  Stopped because of leg fatigue but also noted dyspnea. HR from 72 yo 163 bpm (109% MPHR), BP peak 192/74 (baseline 136/78). =>  Peak VO2  15.1 (102% peak predicted); VE/VCO2 slope 30.  Ventilatory threshold to 0.9 (73%.).  At peak exercises, ventilation reduced 50% of measured MVV indicating ventilatory reserve remain.  O2 pulse was 91% predicted). Findings demonstrate normal functional capacity when compared to [place].  No indication of CP abnormality.  Corrections for ideal body weight suggest body habitus is significant contributing factor for exercise  intolerance.  Also noted hypertensive response to exercise.  Recommendation walking plan provided-10-minute bouts of walking to build up to 30 to 45 minutes daily.  Interval History:   Jo White presents for f/u.   Overall doing better with doing more walking based on the initial recommendation of the CPX results.  She says the more she has been able to do, she is feeling better.  She notes that her exercise intolerance has improved, but she still gets short of breath when she is pushing herself with exercise.  No chest pain or pressure associated with it. She still denies any chest pain or pressure at rest or exertion.  No PND, orthopnea or edema.  No rapid irregular beats palpitations.  Energy level definitely improving with more activity level.    She is a little bit stressed today because her 71 year old basically estranged daughter was hospitalized at [facility] and is in critical care with pneumonia.  Essentially her daughter had distanced herself and the family and I gotten into illicit drugs and alcohol and ended up homeless.  They did not know where she was.  However when she went to the hospital she only asked for the physicians to contact her family and they are hoping to build back a relationship and trust.  She is happy to hopefully get her daughter back, but is sad that she is in such a poor state of health.  CV Review of Symptoms (Summary): Cardiovascular ROS: positive for - dyspnea on exertion and does note that this is proving with more exercise that she is able to do.. negative for - chest pain, edema, irregular heartbeat, orthopnea, palpitations, paroxysmal nocturnal dyspnea, rapid heart rate, shortness of breath, or lightheadedness, dizziness or wooziness, syncope/near syncope or TIA/amaurosis fugax, claudication  REVIEWED OF SYSTEMS   Review of Systems  Constitutional:  Negative for malaise/fatigue (Somewhat deconditioned.) and weight loss.  HENT:  Negative for congestion and sinus  pain.   Respiratory:  Positive for shortness of breath (Only with exertion). Negative for cough and wheezing.   Cardiovascular:  Negative for leg swelling.  Gastrointestinal:  Positive for heartburn (Mostly at night). Negative for abdominal pain, blood in stool and melena.  Genitourinary:  Negative for dysuria and hematuria.  Musculoskeletal:  Positive for joint pain. Negative for back pain, falls and myalgias.  Neurological:  Negative for dizziness, focal weakness and weakness.  Psychiatric/Behavioral:  Negative for depression and memory loss. The patient is not nervous/anxious and does not have insomnia.    I have reviewed and (if needed) personally updated the patient's problem list, medications, allergies, past medical and surgical history, social and family history.   PAST MEDICAL HISTORY   Past Medical History:  Diagnosis Date   Anemia    Basal cell carcinoma 12/19/2019   R ant lat thigh   Blood in stool    Cancer ([place])    bladder   Chronic heel pain [id number]   Complication of anesthesia    External hemorrhoids without mention of complication    Globus sensation 01/23/2020   Hypertension 01/2020   Internal hemorrhoids with other complication  Otalgia, unspecified    Pain in joint, shoulder region 08/19/2014   Personal history of unspecified circulatory disease    PONV (postoperative nausea and vomiting)    Squamous cell carcinoma of skin 12/06/2017   L ant thigh    Squamous cell carcinoma of skin 12/06/2017   R mid lat pretibial   Swelling, mass, or lump in head and neck    UTI (urinary tract infection) 07/2015    PAST SURGICAL HISTORY   Past Surgical History:  Procedure Laterality Date   APPENDECTOMY     BREAST BIOPSY     right   COLONOSCOPY     CYSTOSCOPY     DIAGNOSTIC LAPAROSCOPY     MULITPLE FOR IN VITRO   fallopian tube removal     r tube   HEMORRHOID SURGERY  11/2006   in vitro fertilization  [place] TUMOR N/A  08/06/2015   Procedure: TRANSURETHRAL RESECTION OF BLADDER TUMOR (TURBT);  Surgeon: Royston Cowper, MD;  Location: [facility] ORS;  Service: Urology;  Laterality: N/A;    MEDICATIONS/ALLERGIES   Current Meds  Medication Sig   albuterol (VENTOLIN HFA) 108 (90 Base) MCG/ACT inhaler Inhale 2 puffs into the lungs every 6 (six) hours as needed for wheezing or shortness of breath.   ALPRAZolam (XANAX) 0.25 MG tablet Take 1 tablet (0.25 mg total) by mouth 2 (two) times daily as needed for anxiety.   cholecalciferol (VITAMIN D3) 25 MCG (1000 UNIT) tablet Take 1 tablet (1,000 Units total) by mouth daily.   hydrochlorothiazide (MICROZIDE) 12.5 MG capsule TAKE 1 CAPSULE BY MOUTH EVERY DAY   losartan (COZAAR) 100 MG tablet TAKE 1 TABLET BY MOUTH NIGHTLY   meloxicam (MOBIC) 15 MG tablet Take 15 mg by mouth daily. Take 1 tablet ('15mg'$ ) by mouth daily   rosuvastatin (CRESTOR) 10 MG tablet TAKE 1 TABLET BY MOUTH DAILY    No Known Allergies  SOCIAL HISTORY/FAMILY HISTORY   Reviewed in Epic:  Pertinent findings:  Social History   Tobacco Use   Smoking status: Former    Packs/day: 1.00    Years: 1.50    Total pack years: 1.50    Types: Cigarettes    Quit date: 01/11/1972    Years since quitting: 50.5   Smokeless tobacco: Never   Tobacco comments:    Quit over 48 years ago  Vaping Use   Vaping Use: Never used  Substance Use Topics   Alcohol use: Not Currently    Comment: rare alcohol intake   Drug use: No   Social History   Social History Narrative   No regular exercise, some walking   Diet- some fruit and veggies, avoid fast food   Family History  Problem Relation Age of Onset   Hypertension Mother    Bradycardia Mother    Heart failure Mother 57   Colon polyps Mother    Diverticulosis Mother    Heart disease Mother    Heart disease Father 69       secodnary to Bright's disease   Heart attack Father 45   Glomerulonephritis Father        Bright disease   Diabetes Sister     Hyperlipidemia Sister    Cancer Sister        breast   Breast cancer Sister    Hypertension Sister    Diabetes Sister    Skin cancer Sister    Hypertension Sister    Hypertension Brother    Diabetes  Paternal Grandmother    Colon cancer Neg Hx     OBJCTIVE -PE, EKG, labs   Wt Readings from Last 3 Encounters:  07/02/22 234 lb (106.1 kg)  04/21/22 235 lb 9.6 oz (106.9 kg)  04/06/22 233 lb (105.7 kg)    Physical Exam: BP (!) 130/100 (BP Location: Left Arm, Patient Position: Sitting, Cuff Size: Large)   Pulse 100   Ht '5\' 7"'$  (1.702 m)   Wt 234 lb (106.1 kg)   SpO2 99%   BMI 36.65 kg/m  Physical Exam Vitals reviewed.  Constitutional:      General: She is not in acute distress.    Appearance: Normal appearance. She is obese. She is not ill-appearing or toxic-appearing.  HENT:     Head: Normocephalic and atraumatic.  Neck:     Vascular: No carotid bruit.  Cardiovascular:     Rate and Rhythm: Normal rate and regular rhythm.     Pulses: Normal pulses.     Heart sounds: Normal heart sounds. No murmur heard.    No friction rub. No gallop.  Pulmonary:     Effort: Pulmonary effort is normal. No respiratory distress.     Breath sounds: Normal breath sounds. No wheezing, rhonchi or rales.  Chest:     Chest wall: No tenderness.  Abdominal:     Palpations: Mass: No HSM or bruit.  Musculoskeletal:        General: No swelling. Normal range of motion.     Cervical back: Normal range of motion and neck supple.  Skin:    General: Skin is warm and dry.  Neurological:     General: No focal deficit present.     Mental Status: She is alert and oriented to person, place, and time.  Psychiatric:        Mood and Affect: Mood normal.        Behavior: Behavior normal.        Thought Content: Thought content normal.        Judgment: Judgment normal.     Adult ECG Report  Rate: 76;  Rhythm: normal sinus rhythm and nonspecific ST and T wave changes.  Low voltage. ;  Normal axis,  intervals and durations.  Narrative Interpretation: Borderline  Recent Labs: Reviewed Lab Results  Component Value Date   CHOL 141 02/25/2022   HDL 50.70 02/25/2022   LDLCALC 70 02/25/2022   LDLDIRECT 82.0 02/25/2022   TRIG 101.0 02/25/2022   CHOLHDL 3 02/25/2022   Lab Results  Component Value Date   CREATININE 0.88 03/19/2022   BUN 11 03/19/2022   NA 141 03/19/2022   K 3.8 03/25/2022   CL 107 03/19/2022   CO2 24 03/19/2022      Latest Ref Rng & Units 03/19/2022    5:57 PM 02/25/2022    2:10 PM 02/13/2021   10:07 AM  CBC  WBC 4.0 - 10.5 K/uL 9.4  5.2  5.0   Hemoglobin 12.0 - 15.0 g/dL 13.8  12.6  12.3   Hematocrit 36.0 - 46.0 % 41.1  37.7  37.4   Platelets 150 - 400 K/uL 283  194.0  190.0     Lab Results  Component Value Date   HGBA1C 5.4 02/25/2022   Lab Results  Component Value Date   TSH 1.78 02/25/2022    ================================================== I spent a total of 15  minutes with the patient spent in direct patient consultation.  Additional time spent with chart review  / charting (studies, outside notes,  etc): 17 min Total Time: 32  min  Current medicines are reviewed at length with the patient today.  (+/- concerns) none  Notice: This dictation was prepared with Dragon dictation along with smart phrase technology. Any transcriptional errors that result from this process are unintentional and may not be corrected upon review.  Studies Ordered:   No orders of the defined types were placed in this encounter.  No orders of the defined types were placed in this encounter.   Patient Instructions / Medication Changes & Studies & Tests Ordered   Patient Instructions  Medication Instructions:  Your physician recommends that you continue on your current medications as directed. Please refer to the Current Medication list given to you today.  *If you need a refill on your cardiac medications before your next appointment, please call your  pharmacy*   Lab Work: No labs ordered  If you have labs (blood work) drawn today and your tests are completely normal, you will receive your results only by: [place] (if you have MyChart) OR A paper copy in the mail If you have any lab test that is abnormal or we need to change your treatment, we will call you to review the results.   Testing/Procedures: No testing ordered  Follow-Up: At [facility], you and your health needs are our priority.  As part of our continuing mission to provide you with exceptional heart care, we have created designated Provider Care Teams.  These Care Teams include your primary Cardiologist (physician) and Advanced Practice Providers (APPs -  Physician Assistants and Nurse Practitioners) who all work together to provide you with the care you need, when you need it.  We recommend signing up for the patient portal called "MyChart".  Sign up information is provided on this After Visit Summary.  MyChart is used to connect with patients for Virtual Visits (Telemedicine).  Patients are able to view lab/test results, encounter notes, upcoming appointments, etc.  Non-urgent messages can be sent to your provider as well.   To learn more about what you can do with MyChart, go to [email or web address].    Your next appointment:   1 year(s)  Provider:   You may see Dr. Glenetta Hew or one of the following Advanced Practice Providers on your designated Care Team:   Murray Hodgkins, NP Christell Faith, PA-C Cadence Kathlen Mody, PA-C Gerrie Nordmann, NP       Leonie Man, MD, MS Glenetta Hew, M.D., M.S. Interventional Cardiologist  [place]  Pager # [phone] Phone # [phone] [address]. [place], [place] [address]   Thank you for choosing [facility]!!

## 2022-07-02 ENCOUNTER — Ambulatory Visit: Payer: PPO | Attending: Cardiology | Admitting: Cardiology

## 2022-07-02 ENCOUNTER — Encounter: Payer: Self-pay | Admitting: Cardiology

## 2022-07-02 VITALS — BP 130/100 | HR 100 | Ht 67.0 in | Wt 234.0 lb

## 2022-07-02 DIAGNOSIS — Z8249 Family history of ischemic heart disease and other diseases of the circulatory system: Secondary | ICD-10-CM | POA: Diagnosis not present

## 2022-07-02 DIAGNOSIS — E6609 Other obesity due to excess calories: Secondary | ICD-10-CM

## 2022-07-02 DIAGNOSIS — Z6833 Body mass index (BMI) 33.0-33.9, adult: Secondary | ICD-10-CM | POA: Diagnosis not present

## 2022-07-02 DIAGNOSIS — I10 Essential (primary) hypertension: Secondary | ICD-10-CM | POA: Diagnosis not present

## 2022-07-02 DIAGNOSIS — R0609 Other forms of dyspnea: Secondary | ICD-10-CM

## 2022-07-02 DIAGNOSIS — E785 Hyperlipidemia, unspecified: Secondary | ICD-10-CM | POA: Diagnosis not present

## 2022-07-02 NOTE — Patient Instructions (Signed)
Medication Instructions:  Your physician recommends that you continue on your current medications as directed. Please refer to the Current Medication list given to you today.  *If you need a refill on your cardiac medications before your next appointment, please call your pharmacy*   Lab Work: No labs ordered  If you have labs (blood work) drawn today and your tests are completely normal, you will receive your results only by: [place] (if you have MyChart) OR A paper copy in the mail If you have any lab test that is abnormal or we need to change your treatment, we will call you to review the results.   Testing/Procedures: No testing ordered  Follow-Up: At [facility], you and your health needs are our priority.  As part of our continuing mission to provide you with exceptional heart care, we have created designated Provider Care Teams.  These Care Teams include your primary Cardiologist (physician) and Advanced Practice Providers (APPs -  Physician Assistants and Nurse Practitioners) who all work together to provide you with the care you need, when you need it.  We recommend signing up for the patient portal called "MyChart".  Sign up information is provided on this After Visit Summary.  MyChart is used to connect with patients for Virtual Visits (Telemedicine).  Patients are able to view lab/test results, encounter notes, upcoming appointments, etc.  Non-urgent messages can be sent to your provider as well.   To learn more about what you can do with MyChart, go to [email or web address].    Your next appointment:   1 year(s)  Provider:   You may see Dr. Glenetta Hew or one of the following Advanced Practice Providers on your designated Care Team:   Murray Hodgkins, NP Christell Faith, PA-C Cadence Kathlen Mody, PA-C Gerrie Nordmann, NP

## 2022-07-10 ENCOUNTER — Encounter: Payer: Self-pay | Admitting: Cardiology

## 2022-07-10 NOTE — Assessment & Plan Note (Signed)
Blood pressure looks better today.  Continue current dose of losartan with HCTZ.

## 2022-07-10 NOTE — Assessment & Plan Note (Signed)
Based on results of the CPX, she is fine to get back to a full exercise program.

## 2022-07-10 NOTE — Assessment & Plan Note (Signed)
LDL with most recent lipids being significant 70-80. Extensive rosuvastatin 10 mg daily.  Tolerating current dose

## 2022-07-10 NOTE — Assessment & Plan Note (Addendum)
Reassuring findings on CPX testing that limit his weight recommendation is continued exercise and dietary changes and weight loss.  Recommended starting with 10 minutes interval and building up to 30 to 45 minutes daily walking. Additional exercises would also be helpful.  She already notes feeling better after starting exercise program in the last month.

## 2022-07-13 ENCOUNTER — Ambulatory Visit: Payer: PPO | Admitting: Dermatology

## 2022-07-13 ENCOUNTER — Encounter: Payer: Self-pay | Admitting: Dermatology

## 2022-07-13 VITALS — BP 131/70 | HR 81

## 2022-07-13 DIAGNOSIS — Z1283 Encounter for screening for malignant neoplasm of skin: Secondary | ICD-10-CM

## 2022-07-13 DIAGNOSIS — D229 Melanocytic nevi, unspecified: Secondary | ICD-10-CM | POA: Diagnosis not present

## 2022-07-13 DIAGNOSIS — Z8589 Personal history of malignant neoplasm of other organs and systems: Secondary | ICD-10-CM

## 2022-07-13 DIAGNOSIS — L821 Other seborrheic keratosis: Secondary | ICD-10-CM

## 2022-07-13 DIAGNOSIS — D1801 Hemangioma of skin and subcutaneous tissue: Secondary | ICD-10-CM

## 2022-07-13 DIAGNOSIS — Z79899 Other long term (current) drug therapy: Secondary | ICD-10-CM

## 2022-07-13 DIAGNOSIS — L814 Other melanin hyperpigmentation: Secondary | ICD-10-CM

## 2022-07-13 DIAGNOSIS — L578 Other skin changes due to chronic exposure to nonionizing radiation: Secondary | ICD-10-CM | POA: Diagnosis not present

## 2022-07-13 DIAGNOSIS — L308 Other specified dermatitis: Secondary | ICD-10-CM | POA: Diagnosis not present

## 2022-07-13 DIAGNOSIS — Z85828 Personal history of other malignant neoplasm of skin: Secondary | ICD-10-CM

## 2022-07-13 MED ORDER — MOMETASONE FUROATE 0.1 % EX CREA: 1.0000 | TOPICAL_CREAM | Freq: Every day | CUTANEOUS | 2 refills | Status: DC | PRN

## 2022-07-13 NOTE — Progress Notes (Signed)
Follow-Up Visit   Subjective  Jo White is a 72 y.o. female who presents for the following: Annual Exam (HxBCC, HxSCC). The patient presents for Total-Body Skin Exam (TBSE) for skin cancer screening and mole check.  The patient has spots, moles and lesions to be evaluated, some may be new or changing and the patient has concerns that these could be cancer.  The following portions of the chart were reviewed this encounter and updated as appropriate:  Tobacco  Allergies  Meds  Problems  Med Hx  Surg Hx  Fam Hx     Review of Systems: No other skin or systemic complaints except as noted in HPI or Assessment and Plan.  Objective  Well appearing patient in no apparent distress; mood and affect are within normal limits.  A full examination was performed including scalp, head, eyes, ears, nose, lips, neck, chest, axillae, abdomen, back, buttocks, bilateral upper extremities, bilateral lower extremities, hands, feet, fingers, toes, fingernails, and toenails. All findings within normal limits unless otherwise noted below.  Neck - Posterior at hair line Pink scaly patch   Assessment & Plan   History of Basal Cell Carcinoma of the Skin - No evidence of recurrence today - Recommend regular full body skin exams - Recommend daily broad spectrum sunscreen SPF 30+ to sun-exposed areas, reapply every 2 hours as needed.  - Call if any new or changing lesions are noted between office visits   History of Squamous Cell Carcinoma of the Skin - No evidence of recurrence today - No lymphadenopathy - Recommend regular full body skin exams - Recommend daily broad spectrum sunscreen SPF 30+ to sun-exposed areas, reapply every 2 hours as needed.  - Call if any new or changing lesions are noted between office visits  Lentigines - Scattered tan macules - Due to sun exposure - Benign-appearing, observe - Recommend daily broad spectrum sunscreen SPF 30+ to sun-exposed areas, reapply every 2 hours  as needed. - Call for any changes  Seborrheic Keratoses - Stuck-on, waxy, tan-brown papules and/or plaques  - Benign-appearing - Discussed benign etiology and prognosis. - Observe - Call for any changes  Melanocytic Nevi - Tan-brown and/or pink-flesh-colored symmetric macules and papules - Benign appearing on exam today - Observation - Call clinic for new or changing moles - Recommend daily use of broad spectrum spf 30+ sunscreen to sun-exposed areas.   Hemangiomas - Red papules - Discussed benign nature - Observe - Call for any changes  Actinic Damage - Chronic condition, secondary to cumulative UV/sun exposure - diffuse scaly erythematous macules with underlying dyspigmentation - Recommend daily broad spectrum sunscreen SPF 30+ to sun-exposed areas, reapply every 2 hours as needed.  - Staying in the shade or wearing long sleeves, sun glasses (UVA+UVB protection) and wide brim hats (4-inch brim around the entire circumference of the hat) are also recommended for sun protection.  - Call for new or changing lesions.  Skin cancer screening performed today.  Other eczema Neck - Posterior at hair line Chronic and persistent condition with duration or expected duration over one year. Condition is symptomatic / bothersome to patient. Not to goal. Start Mometasone once or twice daily  5 days per week as needed. Avoid applying to face, groin, and axilla. Use as directed. Long-term use can cause thinning of the skin.  Topical steroids (such as triamcinolone, fluocinolone, fluocinonide, mometasone, clobetasol, halobetasol, betamethasone, hydrocortisone) can cause thinning and lightening of the skin if they are used for too long in the same  area. Your physician has selected the right strength medicine for your problem and area affected on the body. Please use your medication only as directed by your physician to prevent side effects.   mometasone (ELOCON) 0.1 % cream - Neck - Posterior at  hair line Apply 1 Application topically daily as needed (Rash). Apply once or twice a day 5 days per week as needed.  Return in about 1 year (around 07/14/2023) for TBSE, HxBCC, HxSCC.  I, Emelia Salisbury, CMA, am acting as scribe for Sarina Ser, MD. Documentation: I have reviewed the above documentation for accuracy and completeness, and I agree with the above.  Sarina Ser, MD

## 2022-07-13 NOTE — Patient Instructions (Addendum)
Eczema on neck: Start Mometasone once or twice a day 5 days per week as needed. Avoid applying to face, groin, and axilla. Use as directed. Long-term use can cause thinning of the skin.  Topical steroids (such as triamcinolone, fluocinolone, fluocinonide, mometasone, clobetasol, halobetasol, betamethasone, hydrocortisone) can cause thinning and lightening of the skin if they are used for too long in the same area. Your physician has selected the right strength medicine for your problem and area affected on the body. Please use your medication only as directed by your physician to prevent side effects.     Recommend daily broad spectrum sunscreen SPF 30+ to sun-exposed areas, reapply every 2 hours as needed. Call for new or changing lesions.  Staying in the shade or wearing long sleeves, sun glasses (UVA+UVB protection) and wide brim hats (4-inch brim around the entire circumference of the hat) are also recommended for sun protection.    Melanoma ABCDEs  Melanoma is the most dangerous type of skin cancer, and is the leading cause of death from skin disease.  You are more likely to develop melanoma if you: Have light-colored skin, light-colored eyes, or red or blond hair Spend a lot of time in the sun Tan regularly, either outdoors or in a tanning bed Have had blistering sunburns, especially during childhood Have a close family member who has had a melanoma Have atypical moles or large birthmarks  Early detection of melanoma is key since treatment is typically straightforward and cure rates are extremely high if we catch it early.   The first sign of melanoma is often a change in a mole or a new dark spot.  The ABCDE system is a way of remembering the signs of melanoma.  A for asymmetry:  The two halves do not match. B for border:  The edges of the growth are irregular. C for color:  A mixture of colors are present instead of an even brown color. D for diameter:  Melanomas are usually (but  not always) greater than 81m - the size of a pencil eraser. E for evolution:  The spot keeps changing in size, shape, and color.  Please check your skin once per month between visits. You can use a small mirror in front and a large mirror behind you to keep an eye on the back side or your body.   If you see any new or changing lesions before your next follow-up, please call to schedule a visit.  Please continue daily skin protection including broad spectrum sunscreen SPF 30+ to sun-exposed areas, reapplying every 2 hours as needed when you're outdoors.   Staying in the shade or wearing long sleeves, sun glasses (UVA+UVB protection) and wide brim hats (4-inch brim around the entire circumference of the hat) are also recommended for sun protection.    Due to recent changes in healthcare laws, you may see results of your pathology and/or laboratory studies on MyChart before the doctors have had a chance to review them. We understand that in some cases there may be results that are confusing or concerning to you. Please understand that not all results are received at the same time and often the doctors may need to interpret multiple results in order to provide you with the best plan of care or course of treatment. Therefore, we ask that you please give u[place]2 business days to thoroughly review all your results before contacting the office for clarification. Should we see a critical lab result, you will be contacted sooner.  If You Need Anything After Your Visit  If you have any questions or concerns for your doctor, please call our main line at [phone] and press option 4 to reach your doctor's medical assistant. If no one answers, please leave a voicemail as directed and we will return your call as soon as possible. Messages left after 4 pm will be answered the following business day.   You may also send [place] a message via [place]. We typically respond to MyChart messages within 1-2 business  days.  For prescription refills, please ask your pharmacy to contact our office. Our fax number is [phone].  If you have an urgent issue when the clinic is closed that cannot wait until the next business day, you can page your doctor at the number below.    Please note that while we do our best to be available for urgent issues outside of office hours, we are not available 24/7.   If you have an urgent issue and are unable to reach [place], you may choose to seek medical care at your doctor's office, retail clinic, urgent care center, or emergency room.  If you have a medical emergency, please immediately call 911 or go to the emergency department.  Pager Numbers  - Dr. Nehemiah Massed: [phone]  - Dr. Laurence Ferrari: [phone]  - Dr. Nicole Kindred: [phone]  In the event of inclement weather, please call our main line at [phone] for an update on the status of any delays or closures.  Dermatology Medication Tips: Please keep the boxes that topical medications come in in order to help keep track of the instructions about where and how to use these. Pharmacies typically print the medication instructions only on the boxes and not directly on the medication tubes.   If your medication is too expensive, please contact our office at [phone] option 4 or send [place] a message through [place].   We are unable to tell what your co-pay for medications will be in advance as this is different depending on your insurance coverage. However, we may be able to find a substitute medication at lower cost or fill out paperwork to get insurance to cover a needed medication.   If a prior authorization is required to get your medication covered by your insurance company, please allow [place] 1-2 business days to complete this process.  Drug prices often vary depending on where the prescription is filled and some pharmacies may offer cheaper prices.  The website www.goodrx.com contains coupons for medications through  different pharmacies. The prices here do not account for what the cost may be with help from insurance (it may be cheaper with your insurance), but the website can give you the price if you did not use any insurance.  - You can print the associated coupon and take it with your prescription to the pharmacy.  - You may also stop by our office during regular business hours and pick up a GoodRx coupon card.  - If you need your prescription sent electronically to a different pharmacy, notify our office through [facility] or by phone at [phone] option 4.     Si Usted Necesita Algo Despus de Su Visita  Tambin puede enviarnos un mensaje a travs de Pharmacist, community. Por lo general respondemos a los mensajes de MyChart en el transcurso de 1 a 2 das hbiles.  Para renovar recetas, por favor pida a su farmacia que se ponga en contacto con nuestra oficina. Harland Dingwall de fax es [place] [phone].  Si  tiene un asunto urgente cuando la clnica est cerrada y que no puede esperar hasta el siguiente da hbil, puede llamar/localizar a su doctor(a) al nmero que aparece a continuacin.   Por favor, tenga en cuenta que aunque hacemos todo lo posible para estar disponibles para asuntos urgentes fuera del horario de [place], no estamos disponibles las 24 horas del da, los 7 das de la [place].   Si tiene un problema urgente y no puede comunicarse con nosotros, puede optar por buscar atencin mdica  en el consultorio de su doctor(a), en una clnica privada, en un centro de atencin urgente o en una sala de emergencias.  Si tiene Engineering geologist, por favor llame inmediatamente al 911 o vaya a la sala de emergencias.  Nmeros de bper  - Dr. Nehemiah Massed: [phone]  - Dra. Moye: [phone]  - Dra. Nicole Kindred: [phone]  En caso de inclemencias del [place], por favor llame a Johnsie Kindred principal al [phone] para una actualizacin sobre el [place] de cualquier retraso o cierre.  Consejos  para la medicacin en dermatologa: Por favor, guarde las cajas en las que vienen los medicamentos de uso tpico para ayudarle a seguir las instrucciones sobre dnde y cmo usarlos. Las farmacias generalmente imprimen las instrucciones del medicamento slo en las cajas y no directamente en los tubos del [place].   Si su medicamento es muy caro, por favor, pngase en contacto con Zigmund Daniel llamando al [phone] y presione la opcin 4 o envenos un mensaje a travs de Pharmacist, community.   No podemos decirle cul ser su copago por los medicamentos por adelantado ya que esto es diferente dependiendo de la cobertura de su seguro. Sin embargo, es posible que podamos encontrar un medicamento sustituto a Electrical engineer un formulario para que el seguro cubra el medicamento que se considera necesario.   Si se requiere una autorizacin previa para que su compaa de seguros [place] su medicamento, por favor permtanos de 1 a 2 das hbiles para completar este proceso.  Los precios de los medicamentos varan con frecuencia dependiendo del Environmental consultant de dnde se surte la receta y alguna farmacias pueden ofrecer precios ms baratos.  El sitio web www.goodrx.com tiene cupones para medicamentos de Airline pilot. Los precios aqu no tienen en cuenta lo que podra costar con la ayuda del seguro (puede ser ms barato con su seguro), pero el sitio web puede darle el precio si no utiliz Research scientist (physical sciences).  - Puede imprimir el cupn correspondiente y llevarlo con su receta a la farmacia.  - Tambin puede pasar por nuestra oficina durante el horario de atencin regular y Charity fundraiser una tarjeta de cupones de GoodRx.  - Si necesita que su receta se enve electrnicamente a una farmacia diferente, informe a nuestra oficina a travs de MyChart de  o por telfono llamando al [phone] y presione la opcin 4.

## 2022-07-21 ENCOUNTER — Encounter: Payer: Self-pay | Admitting: Dermatology

## 2022-08-28 ENCOUNTER — Ambulatory Visit: Payer: PPO | Admitting: Internal Medicine

## 2022-08-31 ENCOUNTER — Ambulatory Visit (INDEPENDENT_AMBULATORY_CARE_PROVIDER_SITE_OTHER): Payer: PPO | Admitting: Internal Medicine

## 2022-08-31 VITALS — BP 128/72 | HR 97 | Temp 98.0°F | Ht 67.0 in | Wt 235.4 lb

## 2022-08-31 DIAGNOSIS — I10 Essential (primary) hypertension: Secondary | ICD-10-CM | POA: Diagnosis not present

## 2022-08-31 DIAGNOSIS — E785 Hyperlipidemia, unspecified: Secondary | ICD-10-CM

## 2022-08-31 DIAGNOSIS — E6609 Other obesity due to excess calories: Secondary | ICD-10-CM

## 2022-08-31 DIAGNOSIS — E538 Deficiency of other specified B group vitamins: Secondary | ICD-10-CM | POA: Diagnosis not present

## 2022-08-31 DIAGNOSIS — Z6833 Body mass index (BMI) 33.0-33.9, adult: Secondary | ICD-10-CM | POA: Diagnosis not present

## 2022-08-31 DIAGNOSIS — F4322 Adjustment disorder with anxiety: Secondary | ICD-10-CM

## 2022-08-31 NOTE — Progress Notes (Unsigned)
Subjective:  Patient ID: Jo White, female    DOB: 03/16/1951  Age: 72 y.o. MRN: [id number]  CC: The primary encounter diagnosis was Essential hypertension. Diagnoses of Hyperlipidemia LDL goal <100, B12 deficiency, Adjustment reaction with anxiety, and Class 1 obesity due to excess calories without serious comorbidity with body mass index (BMI) of 33.0 to 33.9 in adult were also pertinent to this visit.   HPI SHARRY BEINING presents for  Chief Complaint  Patient presents with   Medical Management of Chronic Issues    1) HTN:  Hypertension: patient checks blood pressure twice weekly at home.  Readings have been for the most part < 140/80 at rest . Patient is following a reduce salt diet most days and is taking medications as prescribed  TAKING LOSARTAN AND HCTZ .  HOME  READINGS 128/78  2) obesity:  exercise has been limited by her current family situation in which she has had to take custody of her 77 yr old granddaughter due to her daughter's use crack cocaine related hospitalization   3) HLD:  tolerating rosuvastatin without muscle pain.   4) stress:  increased due to issues surrounding her previously  estranged drug abusing 71 yr old Type 1 diabetic  daughter who  was hospitalized at [facility] for 3 weeks  with VDRF secondary to  PNA and crack cocaine overdose.     Her 67 yr old granddaughter was taken by CPS and given to her to care for. She already has her 51 yr old granddaughter,  who has been a tremendous help in caring for the 15 yr old granddaughter who now living with her.  Her daughter stayed with patient  3 days after discharge from hospital before being sent to  rehab in [place] .  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 Patient has been estranged from daughter for over 10 years due to her drug abuse and manipulative behavior.  Patient is overwhelmed with  responsibilities in caring for her 76 yr old granddaughter whom she barely knows.    Outpatient Medications Prior to Visit  Medication Sig Dispense Refill   cholecalciferol (VITAMIN D3) 25 MCG (1000 UNIT) tablet Take 1 tablet (1,000 Units total) by mouth daily. 90 tablet 1   hydrochlorothiazide (MICROZIDE) 12.5 MG capsule TAKE 1 CAPSULE BY MOUTH EVERY DAY 90 capsule 1   losartan (COZAAR) 100 MG tablet TAKE 1 TABLET BY MOUTH NIGHTLY 90 tablet 1   meloxicam (MOBIC) 15 MG tablet Take 15 mg by mouth daily. Take 1 tablet ( ) by mouth daily     mometasone (ELOCON) 0.1 % cream Apply 1 Application topically daily as needed (Rash). Apply once or twice a day 5 days per week as needed. 45 g 2   rosuvastatin (CRESTOR) 10 MG tablet TAKE 1 TABLET BY MOUTH DAILY 90 tablet 3   albuterol (VENTOLIN HFA) 108 (90 Base) MCG/ACT inhaler Inhale 2 puffs into the lungs every 6 (six) hours as needed for wheezing or shortness of breath. (Patient not taking: Reported on 08/31/2022) 3.7 g 11   ALPRAZolam (XANAX) 0.25 MG tablet Take 1 tablet (0.25 mg total) by mouth 2 (two) times daily as needed for anxiety. (Patient not taking: Reported on 08/31/2022) 20 tablet 0   cyanocobalamin (VITAMIN B12) 1000 MCG/ML injection Inject 1,000 mcg into the muscle every 30 (thirty) days. (Patient not taking: Reported  on 08/31/2022)     No facility-administered medications prior to visit.    Review of Systems;  Patient denies headache, fevers, malaise, unintentional weight loss, skin rash, eye pain, sinus congestion and sinus pain, sore throat, dysphagia,  hemoptysis , cough, dyspnea, wheezing, chest pain, palpitations, orthopnea, edema, abdominal pain, nausea, melena, diarrhea, constipation, flank pain, dysuria, hematuria, urinary  Frequency, nocturia, numbness, tingling, seizures,  Focal weakness, Loss of consciousness,  Tremor, insomnia, depression,  and suicidal ideation.      Objective:  BP 128/72   Pulse 97   Temp 98 F (36.7 C)  (Oral)   Ht 5\' 7"  (1.702 m)   Wt 235 lb 6.4 oz (106.8 kg)   SpO2 97%   BMI 36.87 kg/m   BP Readings from Last 3 Encounters:  08/31/22 128/72  07/13/22 131/70  07/02/22 (!) 130/100    Wt Readings from Last 3 Encounters:  08/31/22 235 lb 6.4 oz (106.8 kg)  07/02/22 234 lb (106.1 kg)  04/21/22 235 lb 9.6 oz (106.9 kg)    Physical Exam Vitals reviewed.  Constitutional:      General: She is not in acute distress.    Appearance: Normal appearance. She is normal weight. She is not ill-appearing, toxic-appearing or diaphoretic.  HENT:     Head: Normocephalic.  Eyes:     General: No scleral icterus.       Right eye: No discharge.        Left eye: No discharge.     Conjunctiva/sclera: Conjunctivae normal.  Cardiovascular:     Rate and Rhythm: Normal rate and regular rhythm.     Heart sounds: Normal heart sounds.  Pulmonary:     Effort: Pulmonary effort is normal. No respiratory distress.     Breath sounds: Normal breath sounds.  Musculoskeletal:        General: Normal range of motion.  Skin:    General: Skin is warm and dry.  Neurological:     General: No focal deficit present.     Mental Status: She is alert and oriented to person, place, and time. Mental status is at baseline.  Psychiatric:        Attention and Perception: Attention and perception normal.        Mood and Affect: Mood and affect normal.        Behavior: Behavior normal.        Thought Content: Thought content normal.        Judgment: Judgment normal.    Lab Results  Component Value Date   HGBA1C 5.4 02/25/2022   HGBA1C 5.4 06/13/2020   HGBA1C 5.3 01/16/2019    Lab Results  Component Value Date   CREATININE 0.93 08/31/2022   CREATININE 0.88 03/19/2022   CREATININE 0.88 02/25/2022    Lab Results  Component Value Date   WBC 9.4 03/19/2022   HGB 13.8 03/19/2022   HCT 41.1 03/19/2022   PLT 283 03/19/2022   GLUCOSE 95 08/31/2022   CHOL 141 02/25/2022   TRIG 101.0 02/25/2022   HDL 50.70  02/25/2022   LDLDIRECT 74.0 08/31/2022   LDLCALC 70 02/25/2022   ALT 11 08/31/2022   AST 13 08/31/2022   NA 141 08/31/2022   K 3.6 08/31/2022   CL 106 08/31/2022   CREATININE 0.93 08/31/2022   BUN 15 08/31/2022   CO2 28 08/31/2022   TSH 1.78 02/25/2022   HGBA1C 5.4 02/25/2022   MICROALBUR 2.5 (H) 02/25/2022    DG Chest 2 View  Result Date:  03/20/2022 CLINICAL DATA:  Hypertension EXAM: CHEST - 2 VIEW COMPARISON:  None Available. FINDINGS: The heart size and mediastinal contours are within normal limits. Both lungs are clear. The visualized skeletal structures are unremarkable. IMPRESSION: No active cardiopulmonary disease. Electronically Signed   By: Helyn Numbers M.D.   On: 03/20/2022 01:03   CT Head Wo Contrast  Result Date: 03/20/2022 CLINICAL DATA:  Headache, new or worsening (Age >= 50y) EXAM: CT HEAD WITHOUT CONTRAST TECHNIQUE: Contiguous axial images were obtained from the base of the skull through the vertex without intravenous contrast. RADIATION DOSE REDUCTION: This exam was performed according to the departmental dose-optimization program which includes automated exposure control, adjustment of the mA and/or kV according to patient size and/or use of iterative reconstruction technique. COMPARISON:  None Available. FINDINGS: Brain: No acute intracranial abnormality. Specifically, no hemorrhage, hydrocephalus, mass lesion, acute infarction, or significant intracranial injury. Vascular: No hyperdense vessel or unexpected calcification. Skull: No acute calvarial abnormality. Sinuses/Orbits: No acute findings Other: None IMPRESSION: No acute intracranial abnormality. Electronically Signed   By: Charlett Nose M.D.   On: 03/20/2022 00:30    Assessment & Plan:  .Essential hypertension Assessment & Plan: Blood pressure looks better today.  Continue current dose of losartan with HCTZ.  Lab Results  Component Value Date   CREATININE 0.93 08/31/2022   Lab Results  Component Value  Date   NA 141 08/31/2022   K 3.6 08/31/2022   CL 106 08/31/2022   CO2 28 08/31/2022     Orders: -     Comprehensive metabolic panel  Hyperlipidemia LDL goal <100 Assessment & Plan: LDL with most recent lipids being significant 70-80. Extensive rosuvastatin 10 mg daily.  Tolerating current doseTaking rosuvastatin   Lab Results  Component Value Date   CHOL 141 02/25/2022   HDL 50.70 02/25/2022   LDLCALC 70 02/25/2022   LDLDIRECT 74.0 08/31/2022   TRIG 101.0 02/25/2022   CHOLHDL 3 02/25/2022     Orders: -     LDL cholesterol, direct  B12 deficiency -     Vitamin B12  Adjustment reaction with anxiety Assessment & Plan: Secondary to new responsibility of raising her 61 yr old granddaughter due to daughter's illiciti drug use . Counseling and encouragement given.    Class 1 obesity due to excess calories without serious comorbidity with body mass index (BMI) of 33.0 to 33.9 in adult Assessment & Plan: I have addressed  BMI and recommended wt loss of 10% of body weight over the next 6 months using a low fat, fruit/vegetable based [place] diet and regular exercise a minimum of 5 days per week.        I provided  32 minutes of face-to-face time during this encounter reviewing patient's last visit with me, patient's  most recent visit with cardiology,  previous  labs and imaging studies, counseling on currently addressed issues,  and post visit ordering to diagnostics and therapeutics .   Follow-up: Return in about 6 months (around 03/02/2023).   Sherlene Shams, MD

## 2022-08-31 NOTE — Patient Instructions (Signed)
Continue to Check your BP periodically at home and notify me via Mychart if readings are >130/80 so I can  adjust your medications

## 2022-08-31 NOTE — Assessment & Plan Note (Addendum)
LDL with most recent lipids being significant 70-80. Extensive rosuvastatin 10 mg daily.  Tolerating current doseTaking rosuvastatin   Lab Results  Component Value Date   CHOL 141 02/25/2022   HDL 50.70 02/25/2022   LDLCALC 70 02/25/2022   LDLDIRECT 74.0 08/31/2022   TRIG 101.0 02/25/2022   CHOLHDL 3 02/25/2022

## 2022-09-01 DIAGNOSIS — F4322 Adjustment disorder with anxiety: Secondary | ICD-10-CM | POA: Insufficient documentation

## 2022-09-01 LAB — COMPREHENSIVE METABOLIC PANEL
ALT: 11 U/L (ref 0–35)
AST: 13 U/L (ref 0–37)
Albumin: 4.1 g/dL (ref 3.5–5.2)
Alkaline Phosphatase: 75 U/L (ref 39–117)
BUN: 15 mg/dL (ref 6–23)
CO2: 28 mEq/L (ref 19–32)
Calcium: 8.8 mg/dL (ref 8.4–10.5)
Chloride: 106 mEq/L (ref 96–112)
Creatinine, Ser: 0.93 mg/dL (ref 0.40–1.20)
GFR: 62.02 mL/min (ref >60.00)
Glucose, Bld: 95 mg/dL (ref 70–99)
Potassium: 3.6 mEq/L (ref 3.5–5.1)
Sodium: 141 mEq/L (ref 135–145)
Total Bilirubin: 0.4 mg/dL (ref 0.2–1.2)
Total Protein: 6.4 g/dL (ref 6.0–8.3)

## 2022-09-01 LAB — VITAMIN B12: Vitamin B-12: 178 pg/mL — ABNORMAL LOW (ref 211–911)

## 2022-09-01 LAB — LDL CHOLESTEROL, DIRECT: Direct LDL: 74 mg/dL

## 2022-09-01 NOTE — Assessment & Plan Note (Signed)
I have addressed  BMI and recommended wt loss of 10% of body weight over the next 6 months using a low fat, fruit/vegetable based [place] diet and regular exercise a minimum of 5 days per week.   

## 2022-09-01 NOTE — Assessment & Plan Note (Signed)
Secondary to new responsibility of raising her 71 yr old granddaughter due to daughter's illiciti drug use . Counseling and encouragement given.

## 2022-09-01 NOTE — Assessment & Plan Note (Signed)
Blood pressure looks better today.  Continue current dose of losartan with HCTZ.  Lab Results  Component Value Date   CREATININE 0.93 08/31/2022   Lab Results  Component Value Date   NA 141 08/31/2022   K 3.6 08/31/2022   CL 106 08/31/2022   CO2 28 08/31/2022

## 2022-12-17 ENCOUNTER — Other Ambulatory Visit: Payer: Self-pay | Admitting: Internal Medicine

## 2022-12-29 ENCOUNTER — Encounter: Payer: Self-pay | Admitting: Pharmacist

## 2022-12-29 NOTE — Progress Notes (Signed)
Pharmacy Quality Measure Review  This patient is appearing on a report for being at risk of failing the adherence measure for hypertension (ACEi/ARB) medications this calendar year.   Medication: losartan 100 mg daily Last fill date: 7/1 for 90 day supply  Insurance report was not up to date. No action needed at this time.   Catie Eppie Gibson, PharmD, BCACP, CPP Clinical Pharmacist [facility] [phone]

## 2023-02-01 ENCOUNTER — Ambulatory Visit (INDEPENDENT_AMBULATORY_CARE_PROVIDER_SITE_OTHER): Payer: PPO | Admitting: *Deleted

## 2023-02-01 VITALS — Ht 67.0 in | Wt 225.0 lb

## 2023-02-01 DIAGNOSIS — Z0000 Encounter for general adult medical examination without abnormal findings: Secondary | ICD-10-CM

## 2023-02-01 NOTE — Progress Notes (Signed)
Subjective:   Jo White is a 72 y.o. female who presents for Medicare Annual (Subsequent) preventive examination.  Visit Complete: Virtual  I connected with  [name] on 02/01/23 by a audio enabled telemedicine application and verified that I am speaking with the correct person using two identifiers.  Patient Location: Home  Provider Location: Office/Clinic  I discussed the limitations of evaluation and management by telemedicine. The patient expressed understanding and agreed to proceed.  Vital Signs: Unable to obtain new vitals due to this being a telehealth visit.    Cardiac Risk Factors include: advanced age (>41men, >60 women);dyslipidemia;hypertension;obesity (BMI >30kg/m2)     Objective:    Today's Vitals   02/01/23 0945  Weight: 225 lb (102.1 kg)  Height: 5\' 7"  (1.702 m)  PainSc: 1    Body mass index is 35.24 kg/m.     02/01/2023    9:59 AM 03/19/2022    5:54 PM 01/26/2022    2:31 PM 12/24/2020    9:57 AM 12/22/2019    9:52 AM 12/21/2018    9:43 AM 04/20/2018   12:42 PM  Advanced Directives  Does Patient Have a Medical Advance Directive? No No No Yes No No No  Type of Theme park manager;Living will     Does patient want to make changes to medical advance directive?    No - Patient declined     Copy of Healthcare Power of Attorney in Chart?    No - copy requested     Would patient like information on creating a medical advance directive? No - Patient declined  No - Patient declined  No - Patient declined Yes (MAU/Ambulatory/Procedural Areas - Information given) No - Patient declined    Current Medications (verified) Outpatient Encounter Medications as of 02/01/2023  Medication Sig   albuterol (VENTOLIN HFA) 108 (90 Base) MCG/ACT inhaler Inhale 2 puffs into the lungs every 6 (six) hours as needed for wheezing or shortness of breath.   ALPRAZolam (XANAX) 0.25 MG tablet Take 1 tablet (0.25 mg total) by mouth 2 (two) times daily as  needed for anxiety.   cholecalciferol (VITAMIN D3) 25 MCG (1000 UNIT) tablet Take 1 tablet (1,000 Units total) by mouth daily.   cyanocobalamin (VITAMIN B12) 1000 MCG/ML injection Inject 1,000 mcg into the muscle every 30 (thirty) days.   hydrochlorothiazide (MICROZIDE) 12.5 MG capsule TAKE 1 CAPSULE BY MOUTH ONCE DAILY   losartan (COZAAR) 100 MG tablet TAKE ONE TABLET EACH NIGHT   meloxicam (MOBIC) 15 MG tablet Take 15 mg by mouth daily. Take 1 tablet (15mg ) by mouth daily   mometasone (ELOCON) 0.1 % cream Apply 1 Application topically daily as needed (Rash). Apply once or twice a day 5 days per week as needed.   rosuvastatin (CRESTOR) 10 MG tablet TAKE 1 TABLET BY MOUTH DAILY   No facility-administered encounter medications on file as of 02/01/2023.    Allergies (verified) Patient has no known allergies.   History: Past Medical History:  Diagnosis Date   Anemia    Basal cell carcinoma 12/19/2019   R ant lat thigh   Blood in stool    Cancer (HCC)    bladder   Chronic heel pain 08/19/2014   Complication of anesthesia    External hemorrhoids without mention of complication    Globus sensation 01/23/2020   Hypertension 01/2020   Internal hemorrhoids with other complication    Otalgia, unspecified    Pain in joint, shoulder  region 08/19/2014   Personal history of unspecified circulatory disease    PONV (postoperative nausea and vomiting)    Squamous cell carcinoma of skin 12/06/2017   L ant thigh    Squamous cell carcinoma of skin 12/06/2017   R mid lat pretibial   Swelling, mass, or lump in head and neck    UTI (urinary tract infection) 07/2015   Past Surgical History:  Procedure Laterality Date   APPENDECTOMY     BREAST BIOPSY     right   COLONOSCOPY     CYSTOSCOPY     DIAGNOSTIC LAPAROSCOPY     MULITPLE FOR IN VITRO   fallopian tube removal     r tube   HEMORRHOID SURGERY  11/2006   in vitro fertilization  1985   TRANSURETHRAL RESECTION OF BLADDER TUMOR N/A  08/06/2015   Procedure: TRANSURETHRAL RESECTION OF BLADDER TUMOR (TURBT);  Surgeon: Orson Ape, MD;  Location: [facility] OR[facility];  Service: Urology;  Laterality: N/A;   Family History  Problem Relation Age of Onset   Hypertension Mother    Bradycardia Mother    Heart failure Mother 4   Colon polyps Mother    Diverticulosis Mother    Heart disease Mother    Heart disease Father 73       secodnary to Bright's disease   Heart attack Father 70   Glomerulonephritis Father        Bright disease   Diabetes Sister    Hyperlipidemia Sister    Cancer Sister        breast   Breast cancer Sister    Hypertension Sister    Diabetes Sister    Skin cancer Sister    Hypertension Sister    Hypertension Brother    Diabetes Paternal Grandmother    Colon cancer Neg Hx    Social History   Socioeconomic History   Marital status: Married    Spouse name: Not on file   Number of children: 2   Years of education: Not on file   Highest education level: Some college, no degree  Occupational History   Occupation: TECH    Employer: [employer]  Tobacco Use   Smoking status: Former    Current packs/day: 0.00    Average packs/day: 1 pack/day for 1.5 years (1.5 ttl pk-yrs)    Types: Cigarettes    Start date: 07/13/1970    Quit date: 01/11/1972    Years since quitting: 51.0   Smokeless tobacco: Never   Tobacco comments:    Quit over 48 years ago  Vaping Use   Vaping status: Never Used  Substance and Sexual Activity   Alcohol use: Not Currently    Comment: rare alcohol intake   Drug use: No   Sexual activity: Not Currently    Birth control/protection: Post-menopausal, None  Other Topics Concern   Not on file  Social History Narrative   No regular exercise, some walking   Diet- some fruit and veggies, avoid fast food   Social Determinants of Health   Financial Resource Strain: Low Risk  (02/01/2023)   Overall Financial Resource Strain (CARDIA)    Difficulty of Paying Living Expenses:  Not hard at all  Food Insecurity: No Food Insecurity (02/01/2023)   Hunger Vital Sign    Worried About Running Out of Food in the Last Year: Never true    Ran Out of Food in the Last Year: Never true  Transportation Needs: No Transportation Needs (02/01/2023)   PRAPARE -  Administrator, Civil Service (Medical): No    Lack of Transportation (Non-Medical): No  Physical Activity: Inactive (02/01/2023)   Exercise Vital Sign    Days of Exercise per Week: 0 days    Minutes of Exercise per Session: 0 min  Stress: No Stress Concern Present (02/01/2023)   [employer] of Occupational Health - Occupational Stress Questionnaire    Feeling of Stress : Not at all  Social Connections: Socially Integrated (02/01/2023)   Social Connection and Isolation Panel [NHANES]    Frequency of Communication with Friends and Family: More than three times a week    Frequency of Social Gatherings with Friends and Family: More than three times a week    Attends Religious Services: More than 4 times per year    Active Member of [employer] or Organizations: Yes    Attends Engineer, structural: More than 4 times per year    Marital Status: Married    Tobacco Counseling Counseling given: Not Answered Tobacco comments: Quit over 48 years ago   Clinical Intake:  Pre-visit preparation completed: Yes  Pain : 0-10 Pain Score: 1  Pain Type: Chronic pain Pain Location: Back Pain Orientation: Lower Pain Descriptors / Indicators: Aching, Dull Pain Onset: More than a month ago Pain Frequency: Intermittent     BMI - recorded: 35.24 Nutritional Status: BMI > 30  Obese Nutritional Risks: None Diabetes: No  How often do you need to have someone help you when you read instructions, pamphlets, or other written materials from your doctor or pharmacy?: 1 - Never  Interpreter Needed?: No  Information entered by :: R. Tadeusz Stahl LPN   Activities of Daily Living    02/01/2023    9:47 AM  In your  present state of health, do you have any difficulty performing the following activities:  Hearing? 0  Vision? 0  Comment contacts  Difficulty concentrating or making decisions? 0  Walking or climbing stairs? 0  Dressing or bathing? 0  Doing errands, shopping? 0  Preparing Food and eating ? N  Using the Toilet? N  In the past six months, have you accidently leaked urine? Y  Comment at times  Do you have problems with loss of bowel control? N  Managing your Medications? N  Managing your Finances? N  Housekeeping or managing your Housekeeping? N    Patient Care Team: Sherlene Shams, MD as PCP - General (Internal Medicine)  Indicate any recent Medical Services you may have received from other than [facility] providers in the past year (date may be approximate).     Assessment:   This is a routine wellness examination for Rayana.  Hearing/Vision screen Hearing Screening - Comments:: No issues Vision Screening - Comments:: contacts   Goals Addressed             This Visit's Progress    Patient Stated       Wants to exercise and eat healthier       Depression Screen    02/01/2023    9:52 AM 08/31/2022    3:06 PM 04/21/2022    3:50 PM 02/25/2022    1:30 PM 01/26/2022    2:30 PM 01/27/2021   10:40 AM 12/24/2020    9:56 AM  PHQ 2/9 Scores  PHQ - 2 Score 0 0 0 0 0 0 0  PHQ- 9 Score 2          Fall Risk    02/01/2023    9:49 AM  08/31/2022    3:06 PM 04/21/2022    3:49 PM 02/25/2022    1:30 PM 01/26/2022    2:31 PM  Fall Risk   Falls in the past year? 0 0 0 0 0  Number falls in past yr: 0 0   0  Injury with Fall? 0 0   0  Risk for fall due to : No Fall Risks No Fall Risks No Fall Risks No Fall Risks No Fall Risks  Follow up Falls prevention discussed;Falls evaluation completed Falls evaluation completed Falls evaluation completed Falls evaluation completed Falls evaluation completed    MEDICARE RISK AT HOME: Medicare Risk at Home Any stairs in or around the home?:  Yes If so, are there any without handrails?: No Home free of loose throw rugs in walkways, pet beds, electrical cords, etc?: Yes Adequate lighting in your home to reduce risk of falls?: Yes Life alert?: No Use of a cane, walker or w/c?: No Grab bars in the bathroom?: No Shower chair or bench in shower?: Yes Elevated toilet seat or a handicapped toilet?: Yes    Cognitive Function:    12/22/2019    9:55 AM  MMSE - Mini Mental State Exam  Not completed: Unable to complete        02/01/2023    9:59 AM 01/26/2022    2:40 PM 12/21/2018    9:47 AM  6CIT Screen  What Year? 0 points 0 points 0 points  What month? 0 points 0 points 0 points  What time? 0 points 0 points 0 points  Count back from 20 0 points 0 points 0 points  Months in reverse 0 points 0 points 0 points  Repeat phrase 0 points 0 points 0 points  Total Score 0 points 0 points 0 points    Immunizations Immunization History  Administered Date(s) Administered   Fluad Quad(high Dose 65+) 01/18/2019   Influenza Split 02/23/2013, 02/15/2014   Influenza Whole 01/17/2008   Influenza, High Dose Seasonal PF 03/15/2018, 04/08/2020   Influenza-Unspecified 02/15/2014, 03/21/2016, 02/22/2017, 02/11/2022   PFIZER(Purple Top)SARS-COV-2 Vaccination 06/28/2019, 07/19/2019, 07/08/2020   Pneumococcal Conjugate-13 01/23/2020   Respiratory Syncytial Virus Vaccine,Recomb Aduvanted(Arexvy) 03/09/2022   Td 07/02/2008   Tdap 11/23/2011   Zoster Recombinant(Shingrix) 05/13/2022    TDAP status: Due, Education has been provided regarding the importance of this vaccine. Advised may receive this vaccine at local pharmacy or Health Dept. Aware to provide a copy of the vaccination record if obtained from local pharmacy or Health Dept. Verbalized acceptance and understanding.  Flu Vaccine status: Due, Education has been provided regarding the importance of this vaccine. Advised may receive this vaccine at local pharmacy or Health Dept. Aware to  provide a copy of the vaccination record if obtained from local pharmacy or Health Dept. Verbalized acceptance and understanding.  Pneumococcal vaccine status: Due, Education has been provided regarding the importance of this vaccine. Advised may receive this vaccine at local pharmacy or Health Dept. Aware to provide a copy of the vaccination record if obtained from local pharmacy or Health Dept. Verbalized acceptance and understanding.  Covid-19 vaccine status: Information provided on how to obtain vaccines.   Qualifies for Shingles Vaccine? Yes   Zostavax completed No   Shingrix Completed?: No.    Education has been provided regarding the importance of this vaccine. Patient has been advised to call insurance company to determine out of pocket expense if they have not yet received this vaccine. Advised may also receive vaccine at local pharmacy or  Health Dept. Verbalized acceptance and understanding.  Screening Tests Health Maintenance  Topic Date Due   Pneumonia Vaccine 26+ Years old (2 of 2 - PPSV23 or PCV20) 01/22/2021   DTaP/Tdap/Td (3 - Td or Tdap) 11/22/2021   Zoster Vaccines- Shingrix (2 of 2) 07/08/2022   INFLUENZA VACCINE  12/17/2022   COVID-19 Vaccine (4 - 2023-24 season) 01/17/2023   Medicare Annual Wellness (AWV)  01/27/2023   MAMMOGRAM  03/19/2023   Colonoscopy  09/19/2026   DEXA SCAN  Completed   Hepatitis C Screening  Completed   HPV VACCINES  Aged Out    Health Maintenance  Health Maintenance Due  Topic Date Due   Pneumonia Vaccine 70+ Years old (2 of 2 - PPSV23 or PCV20) 01/22/2021   DTaP/Tdap/Td (3 - Td or Tdap) 11/22/2021   Zoster Vaccines- Shingrix (2 of 2) 07/08/2022   INFLUENZA VACCINE  12/17/2022   COVID-19 Vaccine (4 - 2023-24 season) 01/17/2023   Medicare Annual Wellness (AWV)  01/27/2023    Colorectal cancer screening: Type of screening: Colonoscopy. Completed 5/18. Repeat every 10 years  Mammogram status: Completed 11/23. Repeat every year  Bone  Density status: Completed 12/21. Results reflect: Bone density results: NORMAL. Repeat every 2 years. Will discuss with PCP when needs to repeat  Lung Cancer Screening: (Low Dose CT Chest recommended if Age 75-80 years, 20 pack-year currently smoking OR have quit w/in 15years.) does not qualify.     Additional Screening:  Hepatitis C Screening: does qualify; Completed 1/18  Vision Screening: Recommended annual ophthalmology exams for early detection of glaucoma and other disorders of the eye. Is the patient up to date with their annual eye exam?  Yes  Who is the provider or what is the name of the office in which the patient attends annual eye exams? [facility] If pt is not established with a provider, would they like to be referred to a provider to establish care? No .   Dental Screening: Recommended annual dental exams for proper oral hygiene    Community Resource Referral / Chronic Care Management: CRR required this visit?  No   CCM required this visit?  No     Plan:     I have personally reviewed and noted the following in the patient's chart:   Medical and social history Use of alcohol, tobacco or illicit drugs  Current medications and supplements including opioid prescriptions. Patient is not currently taking opioid prescriptions. Functional ability and status Nutritional status Physical activity Advanced directives List of other physicians Hospitalizations, surgeries, and ER visits in previous 12 months Vitals Screenings to include cognitive, depression, and falls Referrals and appointments  In addition, I have reviewed and discussed with patient certain preventive protocols, quality metrics, and best practice recommendations. A written personalized care plan for preventive services as well as general preventive health recommendations were provided to patient.     Sydell Axon, LPN   1/61/0960   After Visit Summary: (MyChart) Due to this being a telephonic  visit, the after visit summary with patients personalized plan was offered to patient via MyChart   Nurse Notes: None

## 2023-02-01 NOTE — Patient Instructions (Signed)
Jo White , Thank you for taking time to come for your Medicare Wellness Visit. I appreciate your ongoing commitment to your health goals. Please review the following plan we discussed and let me know if I can assist you in the future.   Referrals/Orders/Follow-Ups/Clinician Recommendations: None  This is a list of the screening recommended for you and due dates:  Health Maintenance  Topic Date Due   Pneumonia Vaccine (2 of 2 - PPSV23 or PCV20) 01/22/2021   DTaP/Tdap/Td vaccine (3 - Td or Tdap) 11/22/2021   Zoster (Shingles) Vaccine (2 of 2) 07/08/2022   Flu Shot  12/17/2022   COVID-19 Vaccine (4 - 2023-24 season) 01/17/2023   Mammogram  03/19/2023   Medicare Annual Wellness Visit  02/01/2024   Colon Cancer Screening  09/19/2026   DEXA scan (bone density measurement)  Completed   Hepatitis C Screening  Completed   HPV Vaccine  Aged Out    Advanced directives: (Declined) Advance directive discussed with you today. Even though you declined this today, please call our office should you change your mind, and we can give you the proper paperwork for you to fill out.  Next Medicare Annual Wellness Visit scheduled for next year: Yes 02/08/24 @ 10:20

## 2023-03-10 ENCOUNTER — Encounter: Payer: PPO | Admitting: Internal Medicine

## 2023-03-30 ENCOUNTER — Ambulatory Visit (INDEPENDENT_AMBULATORY_CARE_PROVIDER_SITE_OTHER): Payer: PPO | Admitting: Internal Medicine

## 2023-03-30 ENCOUNTER — Encounter: Payer: Self-pay | Admitting: Internal Medicine

## 2023-03-30 VITALS — BP 128/80 | HR 81 | Temp 97.8°F | Ht 67.0 in | Wt 235.2 lb

## 2023-03-30 DIAGNOSIS — F4321 Adjustment disorder with depressed mood: Secondary | ICD-10-CM | POA: Insufficient documentation

## 2023-03-30 DIAGNOSIS — Z8551 Personal history of malignant neoplasm of bladder: Secondary | ICD-10-CM

## 2023-03-30 DIAGNOSIS — I10 Essential (primary) hypertension: Secondary | ICD-10-CM

## 2023-03-30 DIAGNOSIS — E559 Vitamin D deficiency, unspecified: Secondary | ICD-10-CM | POA: Diagnosis not present

## 2023-03-30 DIAGNOSIS — Z634 Disappearance and death of family member: Secondary | ICD-10-CM

## 2023-03-30 DIAGNOSIS — R5383 Other fatigue: Secondary | ICD-10-CM

## 2023-03-30 DIAGNOSIS — G2581 Restless legs syndrome: Secondary | ICD-10-CM | POA: Insufficient documentation

## 2023-03-30 DIAGNOSIS — F4322 Adjustment disorder with anxiety: Secondary | ICD-10-CM

## 2023-03-30 DIAGNOSIS — E538 Deficiency of other specified B group vitamins: Secondary | ICD-10-CM

## 2023-03-30 DIAGNOSIS — D513 Other dietary vitamin B12 deficiency anemia: Secondary | ICD-10-CM

## 2023-03-30 DIAGNOSIS — Z0000 Encounter for general adult medical examination without abnormal findings: Secondary | ICD-10-CM

## 2023-03-30 DIAGNOSIS — Z1231 Encounter for screening mammogram for malignant neoplasm of breast: Secondary | ICD-10-CM

## 2023-03-30 DIAGNOSIS — R7301 Impaired fasting glucose: Secondary | ICD-10-CM

## 2023-03-30 DIAGNOSIS — E785 Hyperlipidemia, unspecified: Secondary | ICD-10-CM

## 2023-03-30 DIAGNOSIS — Z23 Encounter for immunization: Secondary | ICD-10-CM

## 2023-03-30 MED ORDER — ROSUVASTATIN CALCIUM 10 MG PO TABS: 10.0000 mg | ORAL_TABLET | Freq: Every day | ORAL | 3 refills | Status: DC

## 2023-03-30 NOTE — Assessment & Plan Note (Signed)
Ruling out ron deficiency beofre starting requip

## 2023-03-30 NOTE — Assessment & Plan Note (Signed)
Resolved,   no longer raising her 71 yr old granddaughter

## 2023-03-30 NOTE — Assessment & Plan Note (Signed)
Her 71 yr old daughter died 3 weeks ago after family withdrew life support.  She  had been unresponsive for 18 days requiring life support,  following a drug overdose.

## 2023-03-30 NOTE — Assessment & Plan Note (Signed)
Checking intrinsic factor ab .with B12 level today.  Patient is due for monthly home injection

## 2023-03-30 NOTE — Assessment & Plan Note (Addendum)
She has not had  follow up with cystoscopy by [facility] lin years.  UA with micro ordered
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
## 2023-03-30 NOTE — Assessment & Plan Note (Signed)
Blood pressure looks better today.  Continue current dose of losartan with HCTZ.  Lab Results  Component Value Date   CREATININE 0.93 08/31/2022   Lab Results  Component Value Date   NA 141 08/31/2022   K 3.6 08/31/2022   CL 106 08/31/2022   CO2 28 08/31/2022

## 2023-03-30 NOTE — Patient Instructions (Signed)
I am so sorry for your loss.  Please let me know if there is anything I can do to help your grief.

## 2023-03-30 NOTE — Assessment & Plan Note (Signed)

## 2023-03-31 LAB — COMPREHENSIVE METABOLIC PANEL
ALT: 14 U/L (ref 0–35)
AST: 17 U/L (ref 0–37)
Albumin: 4.2 g/dL (ref 3.5–5.2)
Alkaline Phosphatase: 72 U/L (ref 39–117)
BUN: 18 mg/dL (ref 6–23)
CO2: 28 meq/L (ref 19–32)
Calcium: 9.1 mg/dL (ref 8.4–10.5)
Chloride: 104 meq/L (ref 96–112)
Creatinine, Ser: 0.98 mg/dL (ref 0.40–1.20)
GFR: 58.01 mL/min — ABNORMAL LOW (ref >60.00)
Glucose, Bld: 95 mg/dL (ref 70–99)
Potassium: 4.1 meq/L (ref 3.5–5.1)
Sodium: 140 meq/L (ref 135–145)
Total Bilirubin: 0.8 mg/dL (ref 0.2–1.2)
Total Protein: 6.7 g/dL (ref 6.0–8.3)

## 2023-03-31 LAB — CBC WITH DIFFERENTIAL/PLATELET
Basophils Absolute: 0 10*3/uL (ref 0.0–0.1)
Basophils Relative: 0.9 % (ref 0.0–3.0)
Eosinophils Absolute: 0.1 10*3/uL (ref 0.0–0.7)
Eosinophils Relative: 2.7 % (ref 0.0–5.0)
HCT: 35.9 % — ABNORMAL LOW (ref 36.0–46.0)
Hemoglobin: 12 g/dL (ref 12.0–15.0)
Lymphocytes Relative: 22.8 % (ref 12.0–46.0)
Lymphs Abs: 1.1 10*3/uL (ref 0.7–4.0)
MCHC: 33.6 g/dL (ref 30.0–36.0)
MCV: 85.4 fL (ref 78.0–100.0)
Monocytes Absolute: 0.5 10*3/uL (ref 0.1–1.0)
Monocytes Relative: 10 % (ref 3.0–12.0)
Neutro Abs: 3.1 10*3/uL (ref 1.4–7.7)
Neutrophils Relative %: 63.6 % (ref 43.0–77.0)
Platelets: 225 10*3/uL (ref 150.0–400.0)
RBC: 4.2 Mil/uL (ref 3.87–5.11)
RDW: 14.9 % (ref 11.5–15.5)
WBC: 4.9 10*3/uL (ref 4.0–10.5)

## 2023-03-31 LAB — B12 AND FOLATE PANEL
Folate: 18.1 ng/mL (ref >5.9)
Vitamin B-12: 209 pg/mL — ABNORMAL LOW (ref 211–911)

## 2023-03-31 LAB — MICROALBUMIN / CREATININE URINE RATIO
Creatinine,U: 119.5 mg/dL
Microalb Creat Ratio: 1.5 mg/g (ref 0.0–30.0)
Microalb, Ur: 1.8 mg/dL (ref 0.0–1.9)

## 2023-03-31 LAB — LIPID PANEL
Cholesterol: 141 mg/dL (ref 0–200)
HDL: 47.7 mg/dL (ref >39.00)
LDL Cholesterol: 64 mg/dL (ref 0–99)
NonHDL: 93.47
Total CHOL/HDL Ratio: 3
Triglycerides: 148 mg/dL (ref 0.0–149.0)
VLDL: 29.6 mg/dL (ref 0.0–40.0)

## 2023-03-31 LAB — VITAMIN D 25 HYDROXY (VIT D DEFICIENCY, FRACTURES): VITD: 25.22 ng/mL — ABNORMAL LOW (ref 30.00–100.00)

## 2023-03-31 LAB — TSH: TSH: 1.53 u[IU]/mL (ref 0.35–5.50)

## 2023-03-31 LAB — LDL CHOLESTEROL, DIRECT: Direct LDL: 78 mg/dL

## 2023-03-31 LAB — HEMOGLOBIN A1C: Hgb A1c MFr Bld: 5.5 % (ref 4.6–6.5)

## 2023-04-01 MED ORDER — ERGOCALCIFEROL 1.25 MG (50000 UT) PO CAPS: 50000.0000 [IU] | ORAL_CAPSULE | ORAL | 0 refills | Status: DC

## 2023-04-01 NOTE — Addendum Note (Signed)
Addended by: Sherlene Shams on: 04/01/2023 07:30 PM   Modules accepted: Orders

## 2023-04-02 LAB — INTRINSIC FACTOR ANTIBODIES: Intrinsic Factor: NEGATIVE

## 2023-04-02 LAB — IRON,TIBC AND FERRITIN PANEL
%SAT: 22 % (ref 16–45)
Ferritin: 17 ng/mL (ref 16–288)
Iron: 87 ug/dL (ref 45–160)
TIBC: 401 ug/dL (ref 250–450)

## 2023-05-14 ENCOUNTER — Other Ambulatory Visit: Payer: Self-pay | Admitting: Internal Medicine

## 2023-05-18 ENCOUNTER — Other Ambulatory Visit (INDEPENDENT_AMBULATORY_CARE_PROVIDER_SITE_OTHER): Payer: PPO

## 2023-05-18 DIAGNOSIS — E538 Deficiency of other specified B group vitamins: Secondary | ICD-10-CM

## 2023-05-18 DIAGNOSIS — D513 Other dietary vitamin B12 deficiency anemia: Secondary | ICD-10-CM

## 2023-05-23 LAB — INTRINSIC FACTOR ANTIBODIES: Intrinsic Factor: NEGATIVE

## 2023-06-18 ENCOUNTER — Other Ambulatory Visit: Payer: Self-pay | Admitting: Internal Medicine

## 2023-06-18 DIAGNOSIS — Z1231 Encounter for screening mammogram for malignant neoplasm of breast: Secondary | ICD-10-CM | POA: Diagnosis not present

## 2023-06-18 LAB — HM MAMMOGRAPHY

## 2023-07-15 ENCOUNTER — Ambulatory Visit: Payer: PPO | Admitting: Dermatology

## 2023-07-21 NOTE — Progress Notes (Signed)
 Cardiology Office Note:  .   Date:  07/25/2023  ID:  Jo White, DOB 03-Jan-1951, MRN [id number] PCP: Sherlene Shams, MD  [facility] Providers Cardiologist:  None     Chief Complaint  Patient presents with   Follow-up    Annual follow-up.  Doing well.  About the move.    Patient Profile: .     Jo White is an obese 73 y.o. female former smoker with a PMH notable for labile HTN, HLD, and RAD (resolved)  who presents here for annual follow-up at the request of Sherlene Shams, MD.  Jo White was seen for initial consultation on 04/06/2024, following the ER visit on 03/19/2022 and clinic visit follow with Dr. Dale Adrian on 03/20/2022.  Her dose of losartan had been increased to 100 mg daily along with PRN HCTZ 12.5 mg daily.  Blood pressures were elevated usually in the 160/105 mmHg range.  Also noted higher heart rate than usual.  She went to the emergency room for persistent hypertension with blood pressure in the 190s as reviewed.  Had stable exertional dyspnea, and exercise intolerance/easy fatigue but no chest pain or pressure.  She was referred because of elevated blood pressure and family history of CAD. Ordered CPX (06/22/2022): Findings demonstrate normal functional capacity when compared to [facility] norms.  No indication of CP abnormality.  Corrections for ideal body weight suggest body habitus is significant contributing factor for exercise intolerance.  Also noted hypertensive response to exercise.  Recommendation walking plan provided-10-minute bouts of walking to build up to 30 to 45 minutes daily.    Jo White was last seen on 07/02/2022 for routine follow-up doing well.  Walking more frequently based on the original diagnosis and recommendations.  Gradually getting better with increased exercise tolerance.  Less exertional dyspnea.  She was quite upset because her 72 year old estranged daughter was in the ICU at [facility] with severe pneumonia (this is after long  spell of homelessness and illicit drug use)  Subjective  Discussed the use of AI scribe software for clinical note transcription with the patient, who gave verbal consent to proceed.  History of Present Illness   Jo White is a 73 year old female who presents for an annual follow-up.  No current symptoms of chest pain, pressure, or tightness. No heart racing, skipping, or flip-flopping, except occasionally with exertion, which she attributes to weight gain. She has a family history of coronary artery disease, but she herself has not experienced any symptoms.  She uses losartan 100 mg daily and hydrochlorothiazide 12.5 mg for hypertension, taking losartan at night and hydrochlorothiazide in the morning. She also takes Crestor 10 mg for hyperlipidemia, with her cholesterol levels being well-controlled. An inhaler is prescribed as a precaution after a respiratory infection last year, but she does not use it regularly. Xanax was prescribed for anxiety, used only once on the day her daughter died.  Osteoarthritis in her knees is described as 'bone on bone,' causing daily pain and difficulty when standing up. Her knees and legs ache at night, but there is no swelling. [place] with her weight and knee pain.  [place] and three grandsons after her other daughter's death.     Cardiovascular ROS: no chest pain or dyspnea on exertion positive for - wgt gain - related ot Knee OA & less exercise -  so some DOE with increased exertion. negative for - edema, irregular heartbeat, orthopnea, palpitations, paroxysmal nocturnal dyspnea, rapid heart rate, shortness of breath, or dizziness/lightheadedness, syncope/near syncope; TIA/CVA/amaurosis fugax, claudication.  ROS:  Review of Systems - Negative except Sx noted in HPI - knee & leg pain from OA.    Objective   Medications - Losartan 100 mg  daily; - HCTZ 25 mg daily - Crestor 10 mg daily - (as needed)": Albuterol Inhaler; Xanax 0.25 mg (only taken 1 x in las year after the death of her daughter); Mobic 15 mg - Vit D3 *& Vit B12  Studies Reviewed: Marland Kitchen   EKG Interpretation Date/Time:  Thursday July 22 2023 10:31:52 EST Ventricular Rate:  71 PR Interval:  136 QRS Duration:  78 QT Interval:  418 QTC Calculation: 454 R Axis:   -6  Text Interpretation: Normal sinus rhythm Normal ECG When compared with ECG of 19-Mar-2022 17:55, Vent. rate has decreased BY  52 BPM Criteria for Septal infarct are no longer Present Confirmed by Bryan Lemma ([id number]) on 07/22/2023 11:16:53 AM    No new studies.  Lab Results  Component Value Date   CHOL 141 03/30/2023   HDL 47.70 03/30/2023   LDLCALC 64 03/30/2023   LDLDIRECT 78.0 03/30/2023   TRIG 148.0 03/30/2023   CHOLHDL 3 03/30/2023   Lab Results  Component Value Date   NA 140 03/30/2023   K 4.1 03/30/2023   CREATININE 0.98 03/30/2023   GFR 58.01 (L) 03/30/2023   GLUCOSE 95 03/30/2023    Risk Assessment/Calculations:         Physical Exam:   VS:  BP 130/80 (BP Location: Left Arm)   Pulse 71   Ht 5\' 7"  (1.702 m)   Wt 238 lb 9.6 oz (108.2 kg)   SpO2 97%   BMI 37.37 kg/m    Wt Readings from Last 3 Encounters:  07/22/23 238 lb 9.6 oz (108.2 kg)  03/30/23 235 lb 3.2 oz (106.7 kg)  02/01/23 225 lb (102.1 kg)    GEN: Well nourished, well groomed in no acute distress; moderately obese but otherwise healthy-appearing. NECK: No JVD; No carotid bruits CARDIAC: Normal S1, S2; RRR, no murmurs, rubs, gallops RESPIRATORY:  Clear to auscultation without rales, wheezing or rhonchi ; nonlabored, good air movement. ABDOMEN: Soft, non-tender, non-distended; no HSM EXTREMITIES:  No edema; No deformity      ASSESSMENT AND PLAN: .    Problem List Items Addressed This Visit       Cardiology Problems   Essential hypertension (Chronic)   Well controlled on Losartan 100mg  daily and HCTZ  12.5mg  daily.  -Consider consolidating Losartan and HCTZ into a combo medication if she prefers.  (She is currently taking HCTZ in the morning and losartan at night to avoid low blood pressures.)      Relevant Orders   EKG 12-Lead (Completed)   Hyperlipidemia LDL goal <100 (Chronic)   LDL around 70 on Crestor 10mg  daily. No symptoms of CAD despite family history.   -Continue current dose of Crestor. -Monitor weight gain..        Relevant Orders   EKG 12-Lead (Completed)     Other   Family history of MI (myocardial infarction) (Chronic)   No active symptoms of chest pain.  Did well on a CPX.  He is on stable regimen for lipids and hypertension.  Needs to get back exercising and work on weight loss. We talked about taking Coronary Calcium Score, but she is already on medications  that we would recommend based on results.      Relevant Orders   EKG 12-Lead (Completed)   Obesity - Primary (Chronic)   The patient understands the need to lose weight with diet and exercise. We have discussed specific strategies for this. -> Could consider GLP 1 agonist but would defer to PCP.      Relevant Orders   EKG 12-Lead (Completed)    General Health Maintenance / Followup Plans   -Encouraged to establish care with a new primary care provider after moving.   -If records are needed, she can contact the office to have them sent.     Follow-Up: Return if symptoms worsen or fail to improve.     Signed, Marykay Lex, MD, MS Bryan Lemma, M.D., M.S. Interventional Cardiologist  [facility]  Pager # [phone] Phone # [phone] [address] [place], [place] [address]

## 2023-07-22 ENCOUNTER — Ambulatory Visit: Payer: PPO | Attending: Cardiology | Admitting: Cardiology

## 2023-07-22 VITALS — BP 130/80 | HR 71 | Ht 67.0 in | Wt 238.6 lb

## 2023-07-22 DIAGNOSIS — E6609 Other obesity due to excess calories: Secondary | ICD-10-CM

## 2023-07-22 DIAGNOSIS — E785 Hyperlipidemia, unspecified: Secondary | ICD-10-CM

## 2023-07-22 DIAGNOSIS — Z6833 Body mass index (BMI) 33.0-33.9, adult: Secondary | ICD-10-CM | POA: Diagnosis not present

## 2023-07-22 DIAGNOSIS — E66811 Obesity, class 1: Secondary | ICD-10-CM | POA: Diagnosis not present

## 2023-07-22 DIAGNOSIS — Z8249 Family history of ischemic heart disease and other diseases of the circulatory system: Secondary | ICD-10-CM | POA: Diagnosis not present

## 2023-07-22 DIAGNOSIS — I10 Essential (primary) hypertension: Secondary | ICD-10-CM

## 2023-07-22 NOTE — Patient Instructions (Signed)
Medication Instructions:  - Your physician recommends that you continue on your current medications as directed. Please refer to the Current Medication list given to you today.  *If you need a refill on your cardiac medications before your next appointment, please call your pharmacy*   Lab Work: - none ordered  If you have labs (blood work) drawn today and your tests are completely normal, you will receive your results only by: [place] (if you have MyChart) OR A paper copy in the mail If you have any lab test that is abnormal or we need to change your treatment, we will call you to review the results.   Testing/Procedures: - none ordered   Follow-Up: At [facility], you and your health needs are our priority.  As part of our continuing mission to provide you with exceptional heart care, we have created designated Provider Care Teams.  These Care Teams include your primary Cardiologist (physician) and Advanced Practice Providers (APPs -  Physician Assistants and Nurse Practitioners) who all work together to provide you with the care you need, when you need it.  We recommend signing up for the patient portal called "MyChart".  Sign up information is provided on this After Visit Summary.  MyChart is used to connect with patients for Virtual Visits (Telemedicine).  Patients are able to view lab/test results, encounter notes, upcoming appointments, etc.  Non-urgent messages can be sent to your provider as well.   To learn more about what you can do with MyChart, go to [email or web address].    Your next appointment:   As needed   Provider:   Glenetta Hew, MD    Other Instructions N/a

## 2023-07-25 ENCOUNTER — Encounter: Payer: Self-pay | Admitting: Cardiology

## 2023-07-25 NOTE — Assessment & Plan Note (Signed)
 Well controlled on Losartan 100mg  daily and HCTZ 12.5mg  daily.  -Consider consolidating Losartan and HCTZ into a combo medication if she prefers.  (She is currently taking HCTZ in the morning and losartan at night to avoid low blood pressures.)

## 2023-07-25 NOTE — Assessment & Plan Note (Signed)
 LDL around 70 on Crestor 10mg  daily. No symptoms of CAD despite family history.   -Continue current dose of Crestor. -Monitor weight gain.Marland Kitchen

## 2023-07-25 NOTE — Assessment & Plan Note (Addendum)
 No active symptoms of chest pain.  Did well on a CPX.  He is on stable regimen for lipids and hypertension.  Needs to get back exercising and work on weight loss. We talked about taking Coronary Calcium Score, but she is already on medications that we would recommend based on results.

## 2023-07-25 NOTE — Assessment & Plan Note (Signed)
 The patient understands the need to lose weight with diet and exercise. We have discussed specific strategies for this. -> Could consider GLP 1 agonist but would defer to PCP.

## 2023-08-17 ENCOUNTER — Other Ambulatory Visit: Payer: Self-pay | Admitting: Internal Medicine

## 2023-09-22 ENCOUNTER — Encounter: Payer: Self-pay | Admitting: Internal Medicine

## 2023-09-22 DIAGNOSIS — Z8551 Personal history of malignant neoplasm of bladder: Secondary | ICD-10-CM

## 2023-09-24 ENCOUNTER — Other Ambulatory Visit: Payer: Self-pay | Admitting: Internal Medicine

## 2023-09-24 ENCOUNTER — Encounter: Payer: Self-pay | Admitting: *Deleted

## 2023-10-22 DIAGNOSIS — Z8551 Personal history of malignant neoplasm of bladder: Secondary | ICD-10-CM | POA: Diagnosis not present

## 2023-10-22 DIAGNOSIS — Z1330 Encounter for screening examination for mental health and behavioral disorders, unspecified: Secondary | ICD-10-CM | POA: Diagnosis not present

## 2023-11-17 DIAGNOSIS — M79605 Pain in left leg: Secondary | ICD-10-CM | POA: Diagnosis not present

## 2023-11-17 DIAGNOSIS — M79604 Pain in right leg: Secondary | ICD-10-CM | POA: Diagnosis not present

## 2023-11-17 DIAGNOSIS — M7989 Other specified soft tissue disorders: Secondary | ICD-10-CM | POA: Diagnosis not present

## 2023-11-18 ENCOUNTER — Encounter: Payer: Self-pay | Admitting: Internal Medicine

## 2023-11-18 DIAGNOSIS — I10 Essential (primary) hypertension: Secondary | ICD-10-CM

## 2023-11-22 NOTE — Telephone Encounter (Signed)
 Pt requesting refill. Was filled by a different provider. Medication was pended

## 2023-11-23 DIAGNOSIS — Z8551 Personal history of malignant neoplasm of bladder: Secondary | ICD-10-CM | POA: Diagnosis not present

## 2023-12-02 DIAGNOSIS — I83893 Varicose veins of bilateral lower extremities with other complications: Secondary | ICD-10-CM | POA: Diagnosis not present

## 2023-12-02 DIAGNOSIS — I83813 Varicose veins of bilateral lower extremities with pain: Secondary | ICD-10-CM | POA: Diagnosis not present

## 2023-12-03 NOTE — Telephone Encounter (Signed)
 Patient has found a [facility] local to her. Just need to know which labs you would like her to have done.

## 2023-12-09 NOTE — Addendum Note (Signed)
 Addended by: Matty Deamer on: 12/09/2023 01:46 PM   Modules accepted: Orders

## 2023-12-09 NOTE — Telephone Encounter (Signed)
 Spoke with pt to let her know that we have faxed the orders and scheduled pt for a virtual visit in sept.

## 2023-12-13 ENCOUNTER — Other Ambulatory Visit: Payer: Self-pay

## 2023-12-16 DIAGNOSIS — I10 Essential (primary) hypertension: Secondary | ICD-10-CM | POA: Diagnosis not present

## 2023-12-17 LAB — COMPREHENSIVE METABOLIC PANEL WITH GFR
ALT: 11 IU/L (ref 0–32)
AST: 14 IU/L (ref 0–40)
Albumin: 4 g/dL (ref 3.8–4.8)
Alkaline Phosphatase: 86 IU/L (ref 44–121)
BUN/Creatinine Ratio: 18 (ref 12–28)
BUN: 17 mg/dL (ref 8–27)
Bilirubin Total: 0.4 mg/dL (ref 0.0–1.2)
CO2: 25 mmol/L (ref 20–29)
Calcium: 8.9 mg/dL (ref 8.7–10.3)
Chloride: 102 mmol/L (ref 96–106)
Creatinine, Ser: 0.94 mg/dL (ref 0.57–1.00)
Globulin, Total: 2.2 g/dL (ref 1.5–4.5)
Glucose: 99 mg/dL (ref 70–99)
Potassium: 3.6 mmol/L (ref 3.5–5.2)
Sodium: 141 mmol/L (ref 134–144)
Total Protein: 6.2 g/dL (ref 6.0–8.5)
eGFR: 64 mL/min/1.73 (ref >59)

## 2023-12-17 LAB — MICROALBUMIN / CREATININE URINE RATIO
Creatinine, Urine: 126.1 mg/dL
Microalb/Creat Ratio: 22 mg/g{creat} (ref 0–29)
Microalbumin, Urine: 27.4 ug/mL

## 2023-12-18 ENCOUNTER — Ambulatory Visit: Payer: Self-pay | Admitting: Internal Medicine

## 2023-12-24 DIAGNOSIS — M25562 Pain in left knee: Secondary | ICD-10-CM | POA: Diagnosis not present

## 2023-12-24 DIAGNOSIS — M25561 Pain in right knee: Secondary | ICD-10-CM | POA: Diagnosis not present

## 2023-12-24 DIAGNOSIS — G8929 Other chronic pain: Secondary | ICD-10-CM | POA: Diagnosis not present

## 2023-12-31 LAB — HEMOGLOBIN A1C: Hemoglobin A1C: 5.5

## 2024-01-26 ENCOUNTER — Encounter: Payer: Self-pay | Admitting: Internal Medicine

## 2024-01-26 ENCOUNTER — Telehealth: Admitting: Internal Medicine

## 2024-01-26 VITALS — BP 130/81 | Ht 67.0 in | Wt 232.0 lb

## 2024-01-26 DIAGNOSIS — I878 Other specified disorders of veins: Secondary | ICD-10-CM | POA: Diagnosis not present

## 2024-01-26 DIAGNOSIS — I10 Essential (primary) hypertension: Secondary | ICD-10-CM | POA: Diagnosis not present

## 2024-01-26 DIAGNOSIS — Z8551 Personal history of malignant neoplasm of bladder: Secondary | ICD-10-CM

## 2024-01-26 DIAGNOSIS — I83813 Varicose veins of bilateral lower extremities with pain: Secondary | ICD-10-CM | POA: Diagnosis not present

## 2024-01-26 DIAGNOSIS — E785 Hyperlipidemia, unspecified: Secondary | ICD-10-CM | POA: Diagnosis not present

## 2024-01-26 DIAGNOSIS — I83893 Varicose veins of bilateral lower extremities with other complications: Secondary | ICD-10-CM | POA: Diagnosis not present

## 2024-01-26 MED ORDER — LOSARTAN POTASSIUM 100 MG PO TABS: ORAL_TABLET | ORAL | 1 refills | Status: AC

## 2024-01-26 MED ORDER — CYANOCOBALAMIN 1000 MCG/ML IJ SOLN: INTRAMUSCULAR | 1 refills | Status: AC

## 2024-01-26 MED ORDER — HYDROCHLOROTHIAZIDE 12.5 MG PO CAPS: 12.5000 mg | ORAL_CAPSULE | Freq: Every day | ORAL | 1 refills | Status: AC

## 2024-01-26 MED ORDER — ROSUVASTATIN CALCIUM 10 MG PO TABS: 10.0000 mg | ORAL_TABLET | Freq: Every day | ORAL | 1 refills | Status: AC

## 2024-01-26 MED ORDER — CYANOCOBALAMIN 1000 MCG/ML IJ SOLN: INTRAMUSCULAR | 0 refills | Status: DC

## 2024-01-26 NOTE — Progress Notes (Signed)
 Virtual Visit via Caregility   Note   This format is felt to be most appropriate for this patient at this time.  All issues noted in this document were discussed and addressed.  No physical exam was performed (except for noted visual exam findings with Video Visits).   I connected with Jo White  on 01/26/24 at  2:30 PM EDT by a video enabled telemedicine application  and verified that I am speaking with the correct person using two identifiers. Location patient: home Location provider: work or home office Persons participating in the virtual visit: patient, provider  I discussed the limitations, risks, security and privacy concerns of performing an evaluation and management service by telephone and the availability of in person appointments. I also discussed with the patient that there may be a patient responsible charge related to this service. The patient expressed understanding and agreed to proceed.  Reason for visit: follow up med refill   HPI:  Hypertension:  taking losartan  at bedtime and hydrochlorothiazide  in the am   HOME READINGS 130/80 OR LESS   HLD taking Crestor    Prediabetes   NOCTURNAL LEG PAIN:  VENOUS STASIS DIAGNOSED BILATERALLY  Venous ablation planned by Ozell Moselle: asa 81 mg .  Cephalexin , prescribed by Moselle.    History of bladder CA:  Urology evaluation with cystoscopy  by  Dr Silver. [facility],  :  no CA seen   Has a new PCP Shawn Johnson: first appt Feb 1       See pertinent positives and negatives per HPI.  Past Medical History:  Diagnosis Date   Anemia    Arthritis July 2022   Left hand   Basal cell carcinoma 12/19/2019   R ant lat thigh   Blood in stool    Cancer (HCC) 07/2015   bladder   Chronic heel pain 08/19/2014   Complication of anesthesia    External hemorrhoids without mention of complication    Globus sensation 01/23/2020   Hypertension 01/2020   Internal hemorrhoids with other complication    Otalgia, unspecified    Pain in joint,  shoulder region 08/19/2014   Personal history of unspecified circulatory disease    PONV (postoperative nausea and vomiting)    Squamous cell carcinoma of skin 12/06/2017   L ant thigh    Squamous cell carcinoma of skin 12/06/2017   R mid lat pretibial   Swelling, mass, or lump in head and neck    UTI (urinary tract infection) 07/2015    Past Surgical History:  Procedure Laterality Date   APPENDECTOMY  06/1954   BREAST BIOPSY     right   COLONOSCOPY     CYSTOSCOPY     DIAGNOSTIC LAPAROSCOPY     MULITPLE FOR IN VITRO   fallopian tube removal     r tube   HEMORRHOID SURGERY  11/2006   in vitro fertilization  1985   TRANSURETHRAL RESECTION OF BLADDER TUMOR N/A 08/06/2015   Procedure: TRANSURETHRAL RESECTION OF BLADDER TUMOR (TURBT);  Surgeon: Ozell JONELLE Burkes, MD;  Location: [facility] ORS;  Service: Urology;  Laterality: N/A;    Family History  Problem Relation Age of Onset   Hypertension Mother    Bradycardia Mother    Heart failure Mother 56   Colon polyps Mother    Diverticulosis Mother    Heart disease Mother    Heart disease Father 86       secodnary to Bright's disease   Heart attack Father 48   Glomerulonephritis Father  Bright disease   Kidney disease Father    Diabetes Sister    Hyperlipidemia Sister    Cancer Sister        breast   Breast cancer Sister    Hypertension Sister    Diabetes Sister    Skin cancer Sister    Hypertension Sister    Hypertension Brother    Diabetes Paternal Grandmother    Colon cancer Neg Hx     SOCIAL HX: OBJECTIVE: reports that she quit smoking about 52 years ago. Her smoking use included cigarettes. She started smoking about 53 years ago. She has a 1.5 pack-year smoking history. She has never used smokeless tobacco. She reports that she does not currently use alcohol. She reports that she does not use drugs.    Current Outpatient Medications:    albuterol  (VENTOLIN  HFA) 108 (90 Base) MCG/ACT inhaler, Inhale 2 puffs into the  lungs every 6 (six) hours as needed for wheezing or shortness of breath., Disp: 3.7 g, Rfl: 11   ALPRAZolam  (XANAX ) 0.25 MG tablet, Take 1 tablet (0.25 mg total) by mouth 2 (two) times daily as needed for anxiety., Disp: 20 tablet, Rfl: 0   cholecalciferol  (VITAMIN D3) 25 MCG (1000 UNIT) tablet, Take 1 tablet (1,000 Units total) by mouth daily., Disp: 90 tablet, Rfl: 1   meloxicam  (MOBIC ) 15 MG tablet, Take 15 mg by mouth daily. Take 1 tablet (15mg ) by mouth daily, Disp: , Rfl:    cyanocobalamin  (VITAMIN B12) 1000 MCG/ML injection, INJECT 1 ML INTRAMUSCULARLY  MONTHLY., Disp: 3 mL, Rfl: 1   hydrochlorothiazide  (MICROZIDE ) 12.5 MG capsule, Take 1 capsule (12.5 mg total) by mouth daily., Disp: 90 capsule, Rfl: 1   losartan  (COZAAR ) 100 MG tablet, TAKE ONE TABLET EACH NIGHT, Disp: 90 tablet, Rfl: 1   rosuvastatin  (CRESTOR ) 10 MG tablet, Take 1 tablet (10 mg total) by mouth daily., Disp: 90 tablet, Rfl: 1  EXAM:  VITALS per patient if applicable:  GENERAL: alert, oriented, appears well and in no acute distress  HEENT: atraumatic, conjunttiva clear, no obvious abnormalities on inspection of external nose and ears  NECK: normal movements of the head and neck  LUNGS: on inspection no signs of respiratory distress, breathing rate appears normal, no obvious gross SOB, gasping or wheezing  CV: no obvious cyanosis  MS: moves all visible extremities without noticeable abnormality  PSYCH/NEURO: pleasant and cooperative, no obvious depression or anxiety, speech and thought processing grossly intact  ASSESSMENT AND PLAN: Venous stasis Assessment & Plan: With nocturnal leg pain   ablation planned based on vascular visit today    Hyperlipidemia LDL goal <100 Assessment & Plan: LDL with most recent lipids being significant 70-80.TOLERATING  Rosuvastatin  10 mg daily.   Lab Results  Component Value Date   CHOL 141 03/30/2023   HDL 47.70 03/30/2023   LDLCALC 64 03/30/2023   LDLDIRECT 78.0  03/30/2023   TRIG 148.0 03/30/2023   CHOLHDL 3 03/30/2023      Essential hypertension Assessment & Plan: HOME READINGS REVIEWED AND AT GOAL. RENAL FUNCTIO NSTABLE , LYTES NORMAL .   Continue current dose of losartan  with HCTZ.  Lab Results  Component Value Date   CREATININE 0.94 12/16/2023   Lab Results  Component Value Date   NA 141 12/16/2023   K 3.6 12/16/2023   CL 102 12/16/2023   CO2 25 12/16/2023      History of bladder cancer Assessment & Plan: She HAD not had  follow up with cystoscopy by [facility]  In years but [place]rology [place] [place] [facility] [place] and had a normal cystoscopy this summer.    Other orders -     hydroCHLOROthiazide ; Take 1 capsule (12.5 mg total) by mouth daily.  Dispense: 90 capsule; Refill: 1 -     Losartan  Potassium; TAKE ONE TABLET EACH NIGHT  Dispense: 90 tablet; Refill: 1 -     Rosuvastatin  Calcium ; Take 1 tablet (10 mg total) by mouth daily.  Dispense: 90 tablet; Refill: 1 -     Cyanocobalamin ; INJECT 1 ML INTRAMUSCULARLY  MONTHLY.  Dispense: 3 mL; Refill: 1      I discussed the assessment and treatment plan with the patient. The patient was provided an opportunity to ask questions and all were answered. The patient agreed with the plan and demonstrated an understanding of the instructions.   The patient was advised to call back or seek an in-person evaluation if the symptoms worsen or if the condition fails to improve as anticipated.   I spent 30 minutes dedicated to the care of this patient on the date of this encounter to include pre-visit review of patient's medical history,   imaging studies and labs, face-to-face time with the patient , and post visit ordering of testing and therapeutics.    Verneita LITTIE Kettering, MD

## 2024-01-26 NOTE — Assessment & Plan Note (Signed)
 With nocturnal leg pain   ablation planned based on vascular visit today

## 2024-01-26 NOTE — Assessment & Plan Note (Signed)
 She HAD not had  follow up with cystoscopy by [facility] In years but has [place] urology [facility] [place] [facility] [place] and had a normal cystoscopy this summer.

## 2024-01-26 NOTE — Assessment & Plan Note (Signed)
 HOME READINGS REVIEWED AND AT GOAL. RENAL FUNCTIO NSTABLE , LYTES NORMAL .   Continue current dose of losartan  with HCTZ.  Lab Results  Component Value Date   CREATININE 0.94 12/16/2023   Lab Results  Component Value Date   NA 141 12/16/2023   K 3.6 12/16/2023   CL 102 12/16/2023   CO2 25 12/16/2023

## 2024-01-26 NOTE — Assessment & Plan Note (Signed)
 LDL with most recent lipids being significant 70-80.TOLERATING  Rosuvastatin  10 mg daily.   Lab Results  Component Value Date   CHOL 141 03/30/2023   HDL 47.70 03/30/2023   LDLCALC 64 03/30/2023   LDLDIRECT 78.0 03/30/2023   TRIG 148.0 03/30/2023   CHOLHDL 3 03/30/2023

## 2024-02-22 DIAGNOSIS — I83812 Varicose veins of left lower extremities with pain: Secondary | ICD-10-CM | POA: Diagnosis not present

## 2024-02-22 DIAGNOSIS — I83892 Varicose veins of left lower extremities with other complications: Secondary | ICD-10-CM | POA: Diagnosis not present

## 2024-02-24 DIAGNOSIS — I83891 Varicose veins of right lower extremities with other complications: Secondary | ICD-10-CM | POA: Diagnosis not present

## 2024-02-24 DIAGNOSIS — I83811 Varicose veins of right lower extremities with pain: Secondary | ICD-10-CM | POA: Diagnosis not present

## 2024-02-28 DIAGNOSIS — I83892 Varicose veins of left lower extremities with other complications: Secondary | ICD-10-CM | POA: Diagnosis not present

## 2024-02-28 DIAGNOSIS — I83812 Varicose veins of left lower extremities with pain: Secondary | ICD-10-CM | POA: Diagnosis not present

## 2024-03-01 DIAGNOSIS — I83891 Varicose veins of right lower extremities with other complications: Secondary | ICD-10-CM | POA: Diagnosis not present

## 2024-03-01 DIAGNOSIS — I83811 Varicose veins of right lower extremities with pain: Secondary | ICD-10-CM | POA: Diagnosis not present

## 2024-03-01 DIAGNOSIS — I83892 Varicose veins of left lower extremities with other complications: Secondary | ICD-10-CM | POA: Diagnosis not present

## 2024-03-01 DIAGNOSIS — I83812 Varicose veins of left lower extremities with pain: Secondary | ICD-10-CM | POA: Diagnosis not present

## 2024-03-06 DIAGNOSIS — I83811 Varicose veins of right lower extremities with pain: Secondary | ICD-10-CM | POA: Diagnosis not present

## 2024-03-06 DIAGNOSIS — I83812 Varicose veins of left lower extremities with pain: Secondary | ICD-10-CM | POA: Diagnosis not present

## 2024-03-06 DIAGNOSIS — I83892 Varicose veins of left lower extremities with other complications: Secondary | ICD-10-CM | POA: Diagnosis not present

## 2024-03-06 DIAGNOSIS — I83891 Varicose veins of right lower extremities with other complications: Secondary | ICD-10-CM | POA: Diagnosis not present

## 2024-03-13 DIAGNOSIS — I83892 Varicose veins of left lower extremities with other complications: Secondary | ICD-10-CM | POA: Diagnosis not present

## 2024-03-13 DIAGNOSIS — I83811 Varicose veins of right lower extremities with pain: Secondary | ICD-10-CM | POA: Diagnosis not present

## 2024-03-13 DIAGNOSIS — I83812 Varicose veins of left lower extremities with pain: Secondary | ICD-10-CM | POA: Diagnosis not present

## 2024-03-13 DIAGNOSIS — I83891 Varicose veins of right lower extremities with other complications: Secondary | ICD-10-CM | POA: Diagnosis not present

## 2024-03-15 DIAGNOSIS — I83812 Varicose veins of left lower extremities with pain: Secondary | ICD-10-CM | POA: Diagnosis not present

## 2024-03-15 DIAGNOSIS — I83811 Varicose veins of right lower extremities with pain: Secondary | ICD-10-CM | POA: Diagnosis not present

## 2024-03-15 DIAGNOSIS — I83892 Varicose veins of left lower extremities with other complications: Secondary | ICD-10-CM | POA: Diagnosis not present

## 2024-03-15 DIAGNOSIS — I83891 Varicose veins of right lower extremities with other complications: Secondary | ICD-10-CM | POA: Diagnosis not present

## 2024-03-21 DIAGNOSIS — I83892 Varicose veins of left lower extremities with other complications: Secondary | ICD-10-CM | POA: Diagnosis not present

## 2024-03-21 DIAGNOSIS — I83812 Varicose veins of left lower extremities with pain: Secondary | ICD-10-CM | POA: Diagnosis not present
